# Patient Record
Sex: Male | Born: 1945
Health system: Southern US, Community
[De-identification: ages and names within clinical notes are randomized; demographics above are authoritative.]

## PROBLEM LIST (undated history)

## (undated) DIAGNOSIS — G43909 Migraine, unspecified, not intractable, without status migrainosus: Secondary | ICD-10-CM

## (undated) DIAGNOSIS — Z720 Tobacco use: Secondary | ICD-10-CM

## (undated) DIAGNOSIS — G4730 Sleep apnea, unspecified: Secondary | ICD-10-CM

## (undated) DIAGNOSIS — E785 Hyperlipidemia, unspecified: Secondary | ICD-10-CM

## (undated) DIAGNOSIS — I10 Essential (primary) hypertension: Secondary | ICD-10-CM

## (undated) DIAGNOSIS — K8031 Calculus of bile duct with cholangitis, unspecified, with obstruction: Secondary | ICD-10-CM

## (undated) DIAGNOSIS — M1990 Unspecified osteoarthritis, unspecified site: Secondary | ICD-10-CM

## (undated) DIAGNOSIS — J45909 Unspecified asthma, uncomplicated: Secondary | ICD-10-CM

## (undated) DIAGNOSIS — F329 Major depressive disorder, single episode, unspecified: Secondary | ICD-10-CM

## (undated) DIAGNOSIS — J449 Chronic obstructive pulmonary disease, unspecified: Secondary | ICD-10-CM

## (undated) DIAGNOSIS — J189 Pneumonia, unspecified organism: Secondary | ICD-10-CM

## (undated) DIAGNOSIS — D369 Benign neoplasm, unspecified site: Secondary | ICD-10-CM

## (undated) DIAGNOSIS — F1021 Alcohol dependence, in remission: Secondary | ICD-10-CM

## (undated) DIAGNOSIS — F419 Anxiety disorder, unspecified: Secondary | ICD-10-CM

## (undated) DIAGNOSIS — K219 Gastro-esophageal reflux disease without esophagitis: Secondary | ICD-10-CM

## (undated) HISTORY — DX: Essential (primary) hypertension: I10

## (undated) HISTORY — DX: Sleep apnea, unspecified: G47.30

## (undated) HISTORY — DX: Anxiety disorder, unspecified: F41.9

## (undated) HISTORY — DX: Pneumonia, unspecified organism: J18.9

## (undated) HISTORY — DX: Alcohol dependence, in remission: F10.21

## (undated) HISTORY — DX: Unspecified asthma, uncomplicated: J45.909

## (undated) HISTORY — DX: Gastro-esophageal reflux disease without esophagitis: K21.9

## (undated) HISTORY — DX: Calculus of bile duct with cholangitis, unspecified, with obstruction: K80.31

## (undated) HISTORY — DX: Hyperlipidemia, unspecified: E78.5

## (undated) HISTORY — DX: Chronic obstructive pulmonary disease, unspecified: J44.9

## (undated) HISTORY — DX: Unspecified osteoarthritis, unspecified site: M19.90

## (undated) HISTORY — DX: Tobacco use: Z72.0

## (undated) HISTORY — DX: Benign neoplasm, unspecified site: D36.9

## (undated) HISTORY — DX: Major depressive disorder, single episode, unspecified: F32.9

---
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
## 1969-10-08 HISTORY — PX: HEMORRHOID SURGERY: SHX153

## 2000-10-24 LAB — PULMONARY FUNCTION TEST

## 2003-10-09 HISTORY — PX: ANTERIOR FUSION CERVICAL SPINE: SUR626

## 2003-10-13 ENCOUNTER — Emergency Department (HOSPITAL_COMMUNITY): Admission: AD | Admit: 2003-10-13 | Discharge: 2003-10-13 | Payer: Self-pay | Admitting: Family Medicine

## 2004-05-11 ENCOUNTER — Ambulatory Visit (HOSPITAL_COMMUNITY): Admission: RE | Admit: 2004-05-11 | Discharge: 2004-05-11 | Payer: Self-pay | Admitting: Internal Medicine

## 2004-07-12 ENCOUNTER — Ambulatory Visit: Payer: Self-pay | Admitting: *Deleted

## 2004-07-12 ENCOUNTER — Ambulatory Visit: Payer: Self-pay | Admitting: Internal Medicine

## 2004-10-26 ENCOUNTER — Ambulatory Visit: Payer: Self-pay | Admitting: Internal Medicine

## 2004-12-01 ENCOUNTER — Ambulatory Visit: Payer: Self-pay | Admitting: Internal Medicine

## 2004-12-11 ENCOUNTER — Ambulatory Visit: Payer: Self-pay | Admitting: Internal Medicine

## 2005-03-07 ENCOUNTER — Encounter: Admission: RE | Admit: 2005-03-07 | Discharge: 2005-03-07 | Payer: Self-pay | Admitting: Neurology

## 2005-04-02 ENCOUNTER — Ambulatory Visit: Payer: Self-pay | Admitting: Internal Medicine

## 2005-04-24 ENCOUNTER — Ambulatory Visit (HOSPITAL_COMMUNITY): Admission: RE | Admit: 2005-04-24 | Discharge: 2005-04-24 | Payer: Self-pay | Admitting: Internal Medicine

## 2005-04-24 ENCOUNTER — Encounter (INDEPENDENT_AMBULATORY_CARE_PROVIDER_SITE_OTHER): Payer: Self-pay | Admitting: Cardiology

## 2005-06-28 ENCOUNTER — Ambulatory Visit (HOSPITAL_COMMUNITY): Admission: RE | Admit: 2005-06-28 | Discharge: 2005-06-29 | Payer: Self-pay | Admitting: Neurosurgery

## 2005-09-07 ENCOUNTER — Emergency Department (HOSPITAL_COMMUNITY): Admission: EM | Admit: 2005-09-07 | Discharge: 2005-09-08 | Payer: Self-pay | Admitting: Emergency Medicine

## 2005-09-10 ENCOUNTER — Emergency Department (HOSPITAL_COMMUNITY): Admission: EM | Admit: 2005-09-10 | Discharge: 2005-09-10 | Payer: Self-pay | Admitting: Emergency Medicine

## 2005-10-19 ENCOUNTER — Ambulatory Visit: Payer: Self-pay | Admitting: Internal Medicine

## 2005-10-29 ENCOUNTER — Ambulatory Visit: Payer: Self-pay | Admitting: Internal Medicine

## 2005-11-07 ENCOUNTER — Ambulatory Visit: Payer: Self-pay | Admitting: Internal Medicine

## 2005-12-10 ENCOUNTER — Ambulatory Visit: Payer: Self-pay | Admitting: Internal Medicine

## 2005-12-17 ENCOUNTER — Emergency Department (HOSPITAL_COMMUNITY): Admission: EM | Admit: 2005-12-17 | Discharge: 2005-12-17 | Payer: Self-pay | Admitting: *Deleted

## 2006-04-18 ENCOUNTER — Ambulatory Visit: Payer: Self-pay | Admitting: Internal Medicine

## 2006-11-07 ENCOUNTER — Ambulatory Visit: Payer: Self-pay | Admitting: Internal Medicine

## 2006-11-11 ENCOUNTER — Ambulatory Visit (HOSPITAL_COMMUNITY): Admission: RE | Admit: 2006-11-11 | Discharge: 2006-11-11 | Payer: Self-pay | Admitting: Internal Medicine

## 2007-02-17 ENCOUNTER — Ambulatory Visit: Payer: Self-pay | Admitting: Internal Medicine

## 2007-03-06 ENCOUNTER — Ambulatory Visit: Payer: Self-pay | Admitting: Internal Medicine

## 2007-03-18 ENCOUNTER — Encounter: Payer: Self-pay | Admitting: Internal Medicine

## 2007-03-18 ENCOUNTER — Ambulatory Visit (HOSPITAL_COMMUNITY): Admission: RE | Admit: 2007-03-18 | Discharge: 2007-03-18 | Payer: Self-pay | Admitting: Internal Medicine

## 2007-03-20 ENCOUNTER — Ambulatory Visit: Payer: Self-pay | Admitting: Internal Medicine

## 2007-04-03 ENCOUNTER — Ambulatory Visit: Payer: Self-pay | Admitting: Internal Medicine

## 2007-04-23 ENCOUNTER — Ambulatory Visit: Payer: Self-pay | Admitting: Internal Medicine

## 2007-04-25 ENCOUNTER — Ambulatory Visit (HOSPITAL_COMMUNITY): Admission: RE | Admit: 2007-04-25 | Discharge: 2007-04-25 | Payer: Self-pay | Admitting: Internal Medicine

## 2007-06-22 ENCOUNTER — Emergency Department (HOSPITAL_COMMUNITY): Admission: EM | Admit: 2007-06-22 | Discharge: 2007-06-22 | Payer: Self-pay | Admitting: Emergency Medicine

## 2007-12-01 ENCOUNTER — Ambulatory Visit: Payer: Self-pay | Admitting: Internal Medicine

## 2007-12-25 ENCOUNTER — Ambulatory Visit: Payer: Self-pay | Admitting: Internal Medicine

## 2008-01-29 ENCOUNTER — Ambulatory Visit: Payer: Self-pay | Admitting: Internal Medicine

## 2008-01-29 DIAGNOSIS — D369 Benign neoplasm, unspecified site: Secondary | ICD-10-CM

## 2008-01-29 HISTORY — PX: COLONOSCOPY W/ BIOPSIES: SHX1374

## 2008-01-29 HISTORY — DX: Benign neoplasm, unspecified site: D36.9

## 2008-05-20 ENCOUNTER — Inpatient Hospital Stay (HOSPITAL_COMMUNITY): Admission: EM | Admit: 2008-05-20 | Discharge: 2008-05-24 | Payer: Self-pay | Admitting: Emergency Medicine

## 2008-05-20 ENCOUNTER — Ambulatory Visit: Payer: Self-pay | Admitting: Internal Medicine

## 2008-05-27 ENCOUNTER — Ambulatory Visit: Payer: Self-pay | Admitting: Internal Medicine

## 2008-05-31 ENCOUNTER — Ambulatory Visit: Payer: Self-pay | Admitting: Internal Medicine

## 2008-10-28 ENCOUNTER — Ambulatory Visit: Payer: Self-pay | Admitting: Internal Medicine

## 2008-10-28 LAB — CONVERTED CEMR LAB
AST: 32 units/L (ref 0–37)
Albumin: 4 g/dL (ref 3.5–5.2)
Alkaline Phosphatase: 145 units/L — ABNORMAL HIGH (ref 39–117)
Basophils Relative: 1 % (ref 0–1)
Eosinophils Absolute: 0.3 10*3/uL (ref 0.0–0.7)
Lymphs Abs: 3.9 10*3/uL (ref 0.7–4.0)
MCHC: 30.2 g/dL (ref 30.0–36.0)
MCV: 89.7 fL (ref 78.0–100.0)
Neutro Abs: 4.8 10*3/uL (ref 1.7–7.7)
Neutrophils Relative %: 50 % (ref 43–77)
Platelets: 271 10*3/uL (ref 150–400)
Potassium: 4.5 meq/L (ref 3.5–5.3)
Sodium: 138 meq/L (ref 135–145)
Total Protein: 7.5 g/dL (ref 6.0–8.3)
WBC: 9.7 10*3/uL (ref 4.0–10.5)

## 2008-11-17 ENCOUNTER — Encounter (INDEPENDENT_AMBULATORY_CARE_PROVIDER_SITE_OTHER): Payer: Self-pay | Admitting: *Deleted

## 2008-11-24 ENCOUNTER — Ambulatory Visit: Payer: Self-pay | Admitting: Internal Medicine

## 2008-12-09 ENCOUNTER — Ambulatory Visit: Payer: Self-pay | Admitting: Surgery

## 2008-12-09 ENCOUNTER — Encounter: Payer: Self-pay | Admitting: Internal Medicine

## 2008-12-09 ENCOUNTER — Ambulatory Visit (HOSPITAL_COMMUNITY): Admission: RE | Admit: 2008-12-09 | Discharge: 2008-12-09 | Payer: Self-pay | Admitting: Internal Medicine

## 2009-01-06 ENCOUNTER — Encounter: Payer: Self-pay | Admitting: Internal Medicine

## 2009-01-28 ENCOUNTER — Ambulatory Visit: Payer: Self-pay | Admitting: Internal Medicine

## 2009-01-28 ENCOUNTER — Encounter (INDEPENDENT_AMBULATORY_CARE_PROVIDER_SITE_OTHER): Payer: Self-pay | Admitting: Internal Medicine

## 2009-01-28 LAB — CONVERTED CEMR LAB
Benzodiazepines.: NEGATIVE
LDL Cholesterol: 163 mg/dL — ABNORMAL HIGH (ref 0–99)
Methadone: NEGATIVE
PSA: 0.53 ng/mL (ref 0.10–4.00)
Propoxyphene: NEGATIVE
VLDL: 63 mg/dL — ABNORMAL HIGH (ref 0–40)

## 2009-01-31 ENCOUNTER — Ambulatory Visit (HOSPITAL_COMMUNITY): Admission: RE | Admit: 2009-01-31 | Discharge: 2009-01-31 | Payer: Self-pay | Admitting: Internal Medicine

## 2009-02-22 ENCOUNTER — Ambulatory Visit: Payer: Self-pay | Admitting: Internal Medicine

## 2009-04-07 ENCOUNTER — Ambulatory Visit: Payer: Self-pay | Admitting: Internal Medicine

## 2009-04-07 ENCOUNTER — Encounter (INDEPENDENT_AMBULATORY_CARE_PROVIDER_SITE_OTHER): Payer: Self-pay | Admitting: Adult Health

## 2009-04-07 LAB — CONVERTED CEMR LAB
AST: 43 units/L — ABNORMAL HIGH (ref 0–37)
Albumin: 4.2 g/dL (ref 3.5–5.2)
Alkaline Phosphatase: 161 units/L — ABNORMAL HIGH (ref 39–117)
BUN: 7 mg/dL (ref 6–23)
HDL: 29 mg/dL — ABNORMAL LOW (ref >39)
LDL Cholesterol: 85 mg/dL (ref 0–99)
Potassium: 4.6 meq/L (ref 3.5–5.3)
Sodium: 139 meq/L (ref 135–145)
Total Protein: 7.8 g/dL (ref 6.0–8.3)

## 2009-05-09 ENCOUNTER — Ambulatory Visit: Payer: Self-pay | Admitting: Internal Medicine

## 2009-05-12 ENCOUNTER — Ambulatory Visit (HOSPITAL_COMMUNITY): Admission: RE | Admit: 2009-05-12 | Discharge: 2009-05-12 | Payer: Self-pay | Admitting: Internal Medicine

## 2009-05-18 ENCOUNTER — Telehealth (INDEPENDENT_AMBULATORY_CARE_PROVIDER_SITE_OTHER): Payer: Self-pay | Admitting: *Deleted

## 2009-05-20 ENCOUNTER — Ambulatory Visit: Payer: Self-pay | Admitting: Internal Medicine

## 2009-06-28 IMAGING — CR DG ABDOMEN 2V
2 series · 2 of 2 positions shown · non-contrast
Comparison: 05/20/2008

CLINICAL DATA: Epigastric pain.  GI bleeding.  Nausea vomiting for
2 days.

ABDOMEN - 2 VIEW

[w abdomen upright]
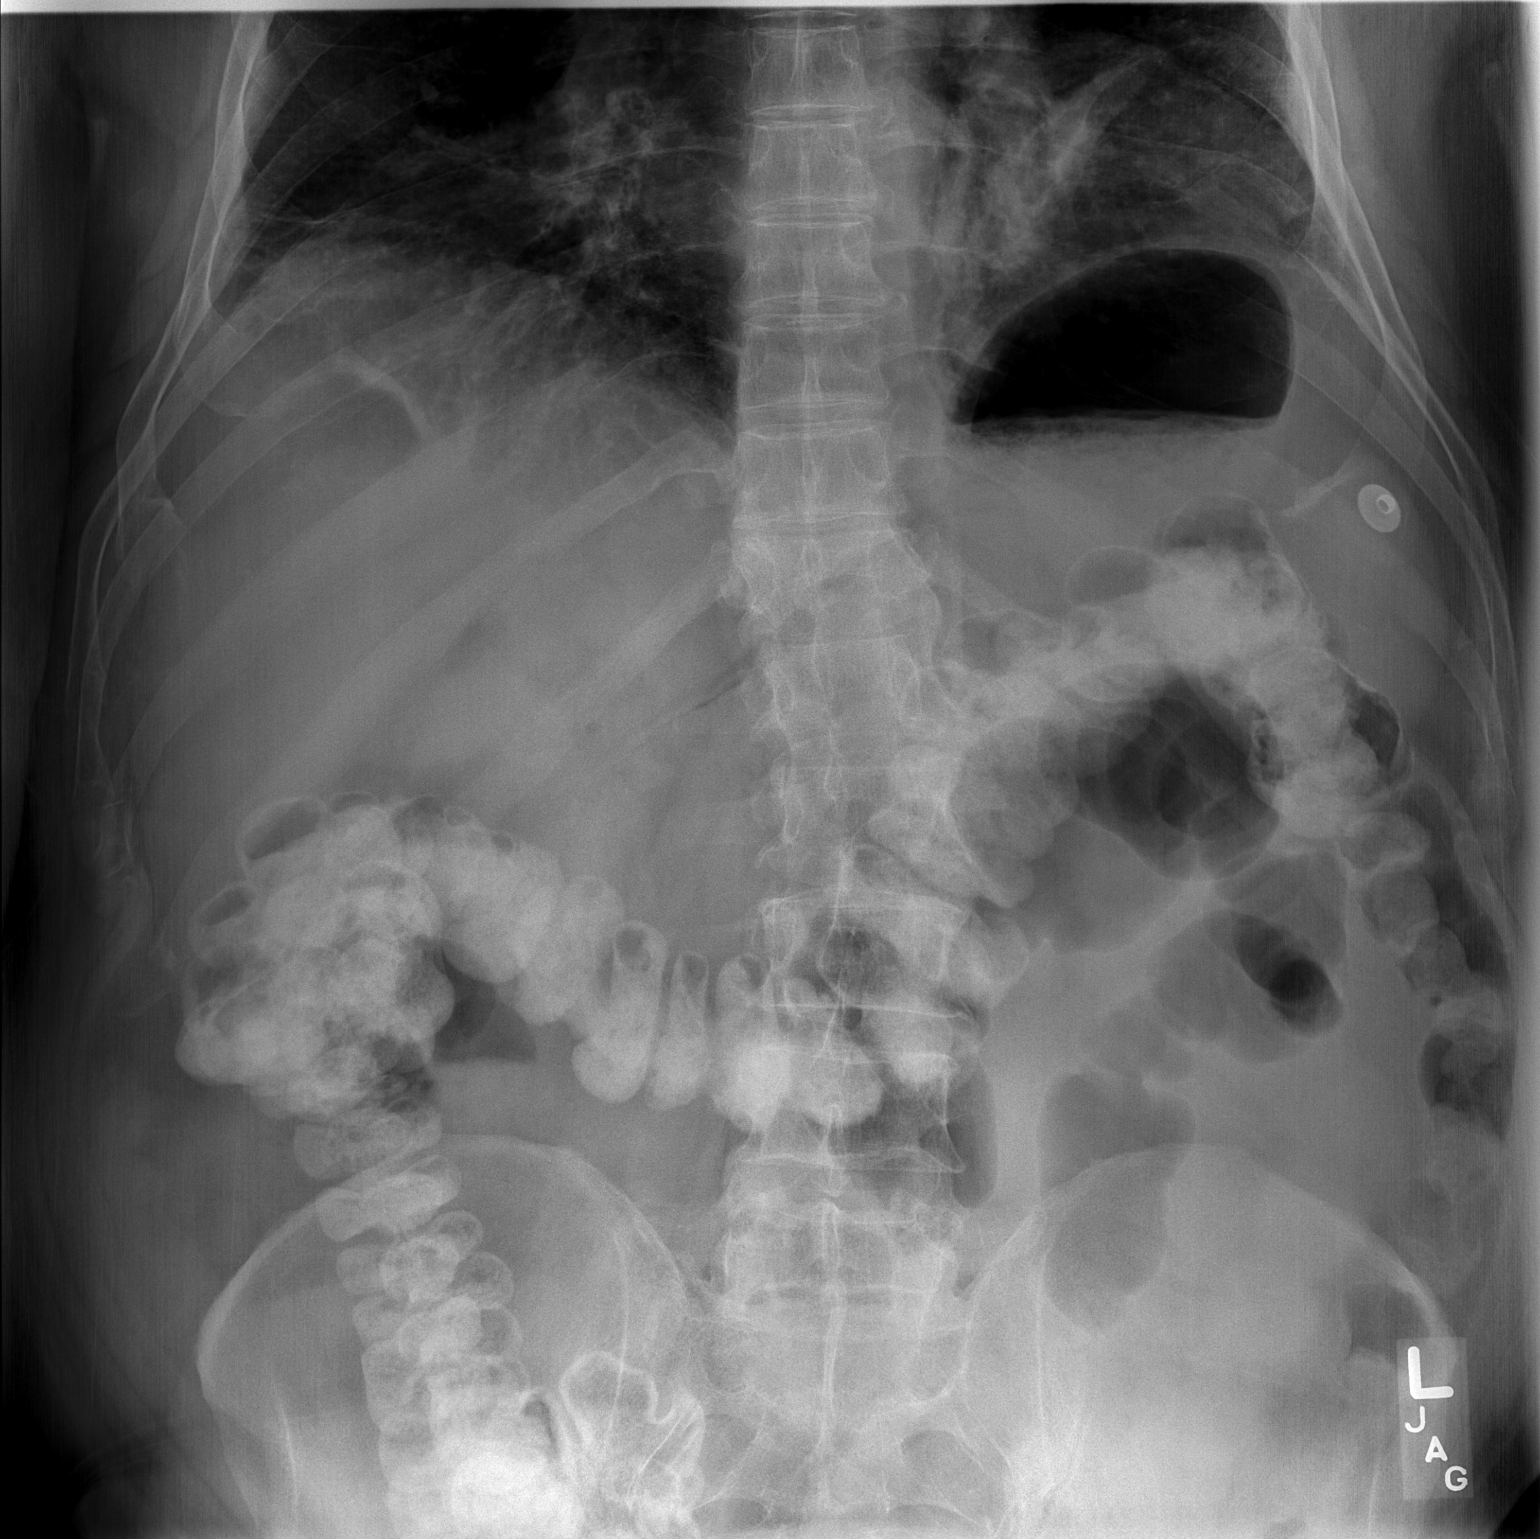

[t abdomen supine]
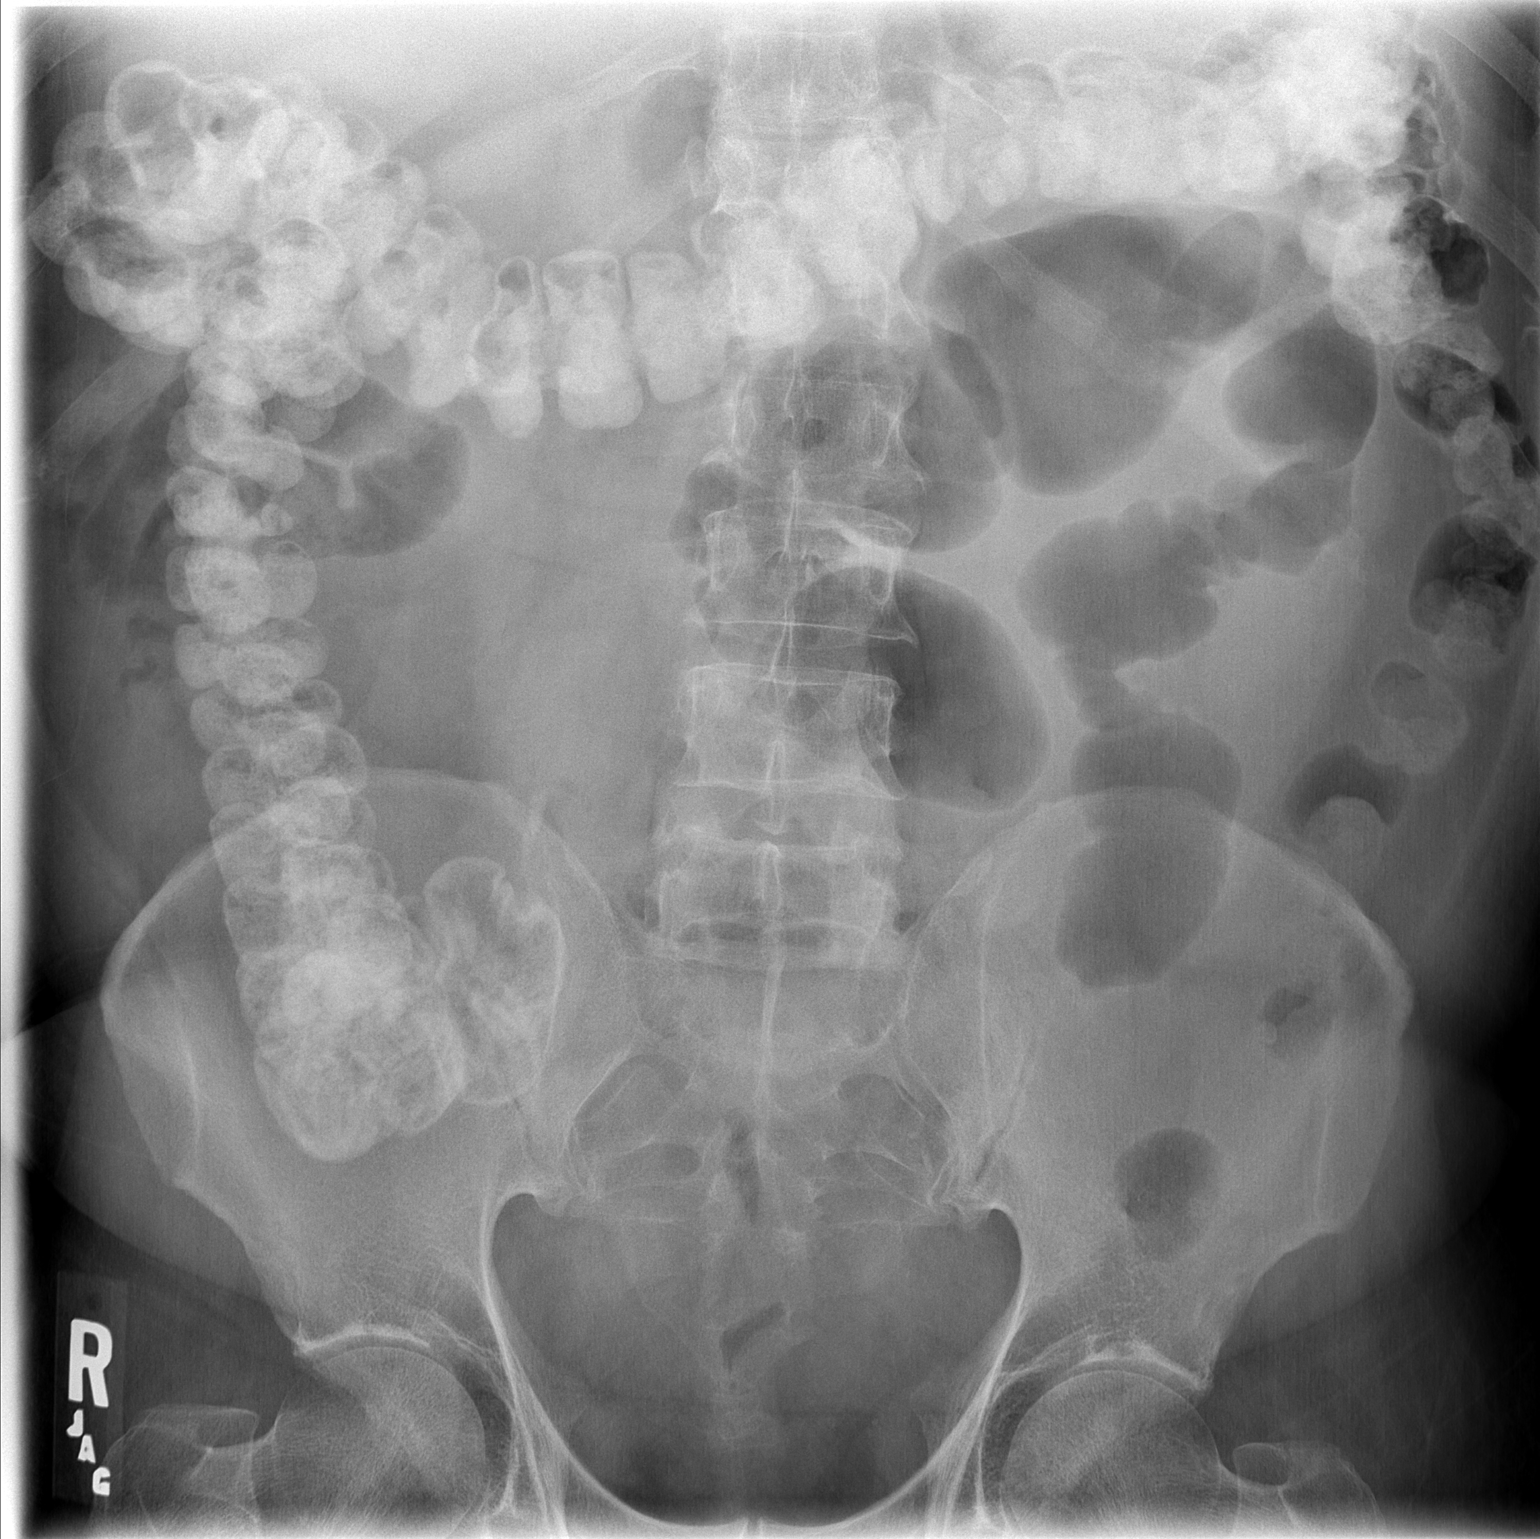

[2 of 2 positions shown; findings below may reference images not displayed]

FINDINGS: Contrast is identified within nondilated loops of colon.
There is gaseous distension of small bowel loops in the mid
abdomen.  This may represent a transient phenomenon as the CT on
05/20/2008 showed no dilated small bowel loops in the same region.
Follow-up films may be helpful to determine if this is a persistent
abnormality.  There is no free intraperitoneal air.
IMPRESSION: Dilatation of small bowel loops in the central abdomen are
consistent with focal ileus, see above.  The pattern is
nonobstructed given the presence of contrast in the colon.

## 2009-06-29 IMAGING — CR DG ABDOMEN 2V
3 series · 3 of 3 positions shown · non-contrast
Comparison: 05/21/2008, CT 05/20/2008.

CLINICAL DATA: 62-year-old male with abdominal pain and history of
gastrointestinal bleeding.

ABDOMEN - 2 VIEW

[w abdomen upright]
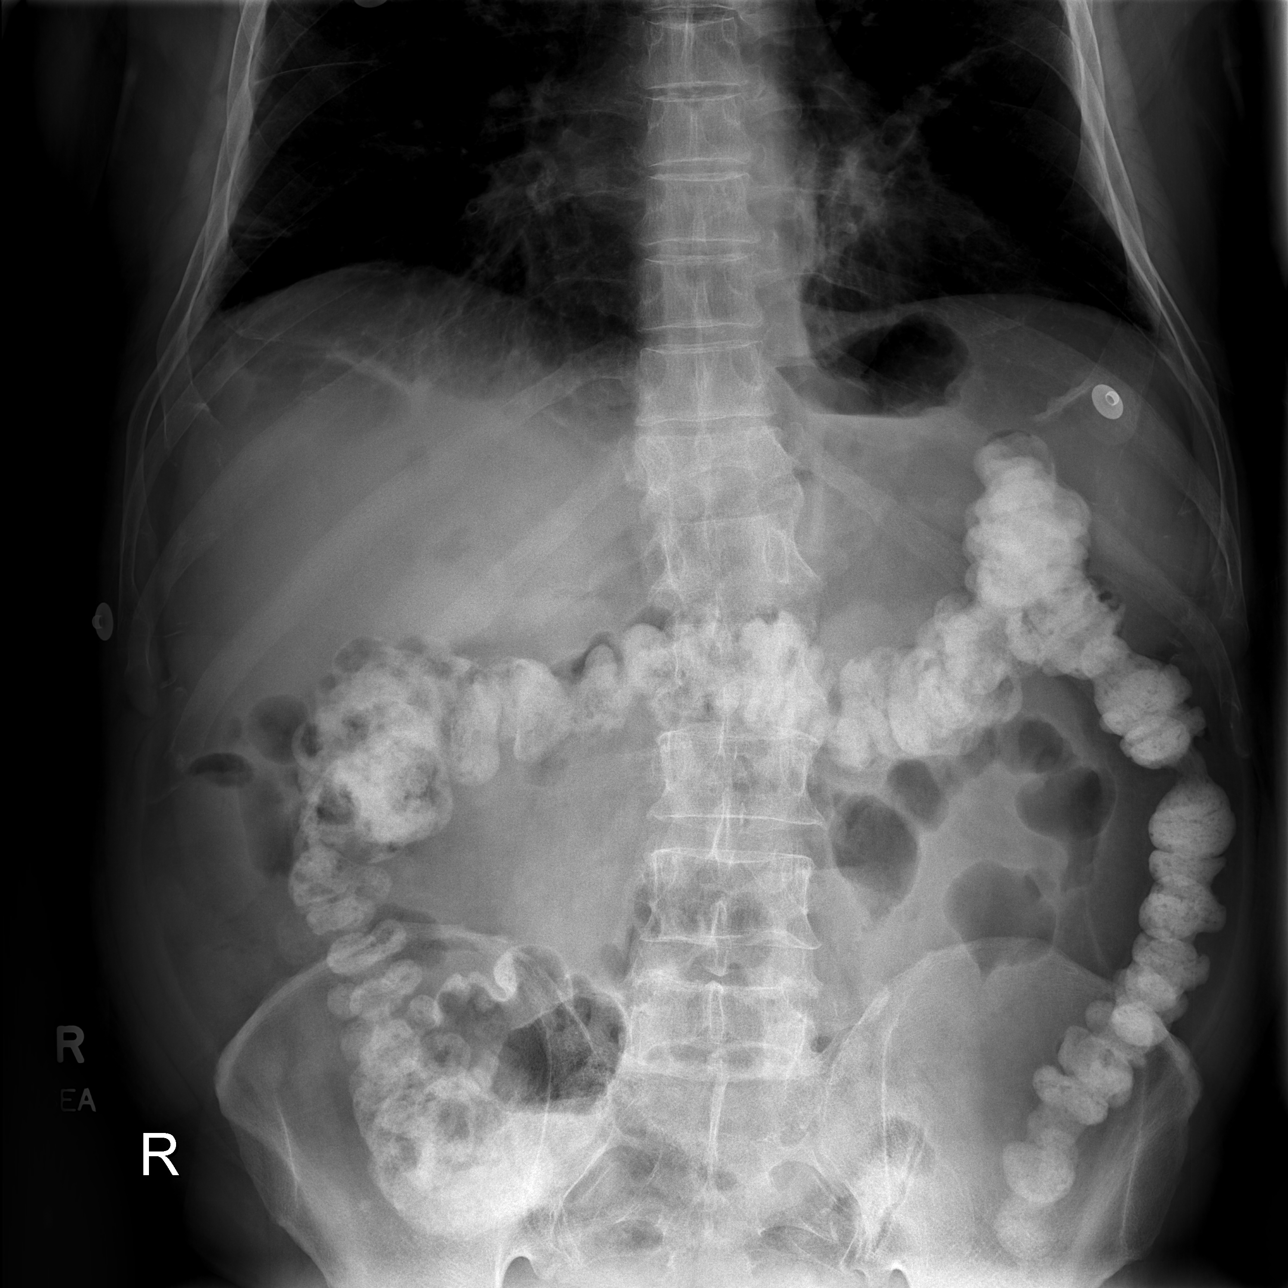

[t abdomen supine (1 of 2)]
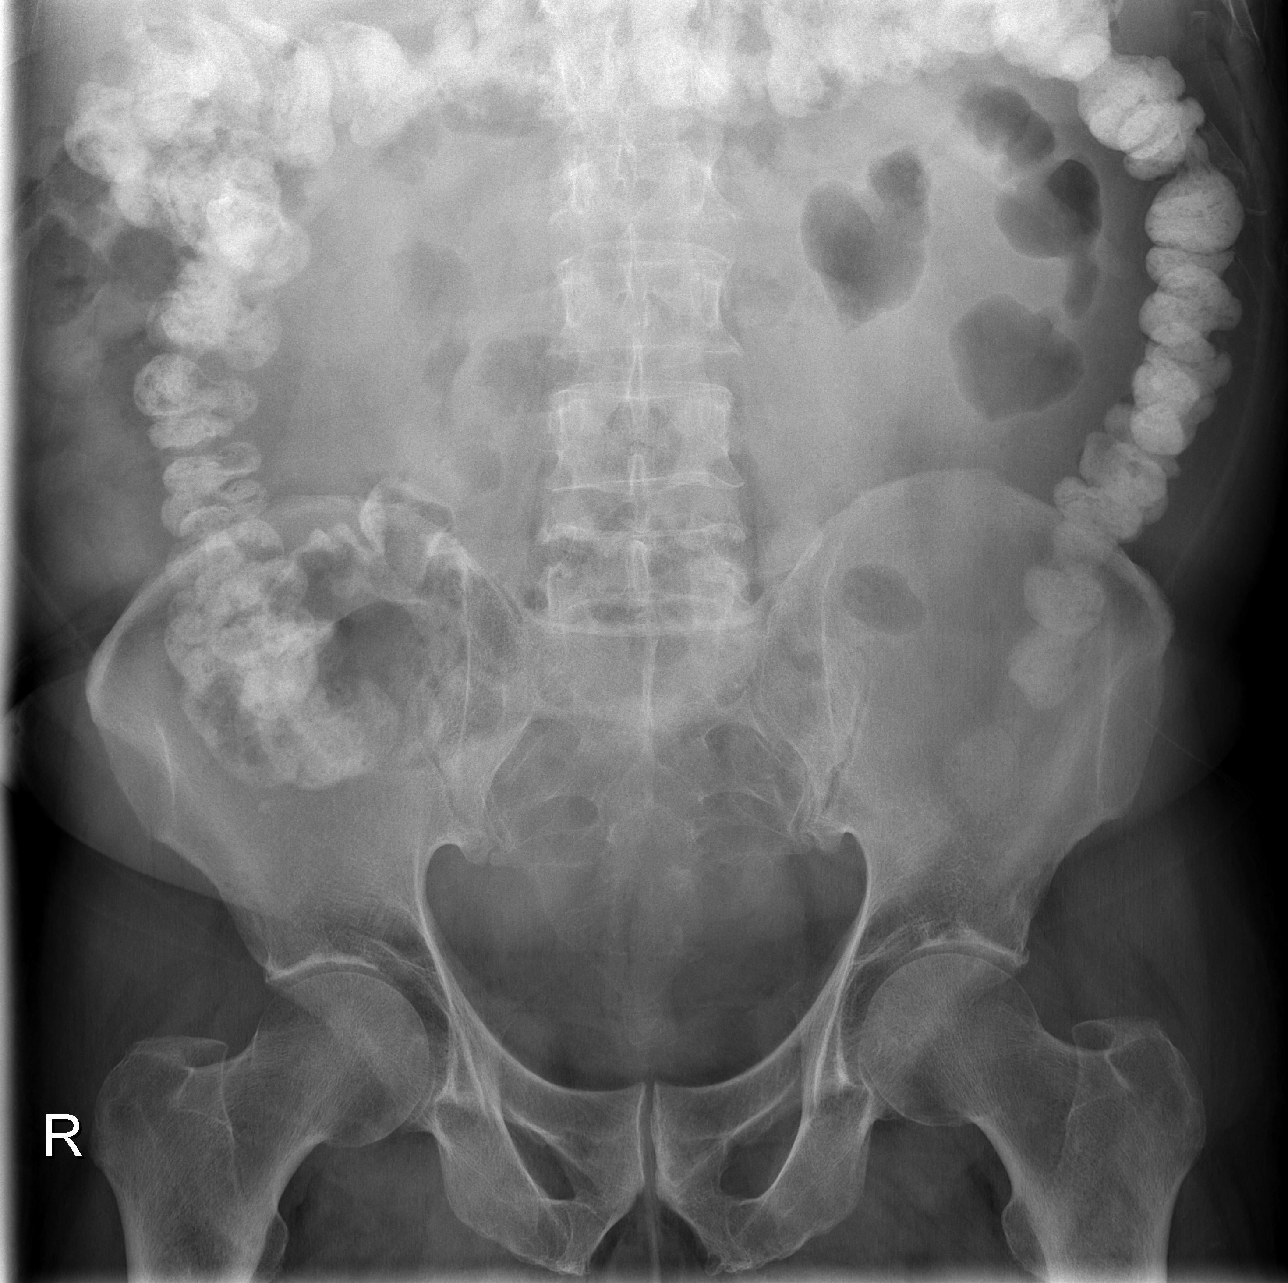

[t abdomen supine (2 of 2)]
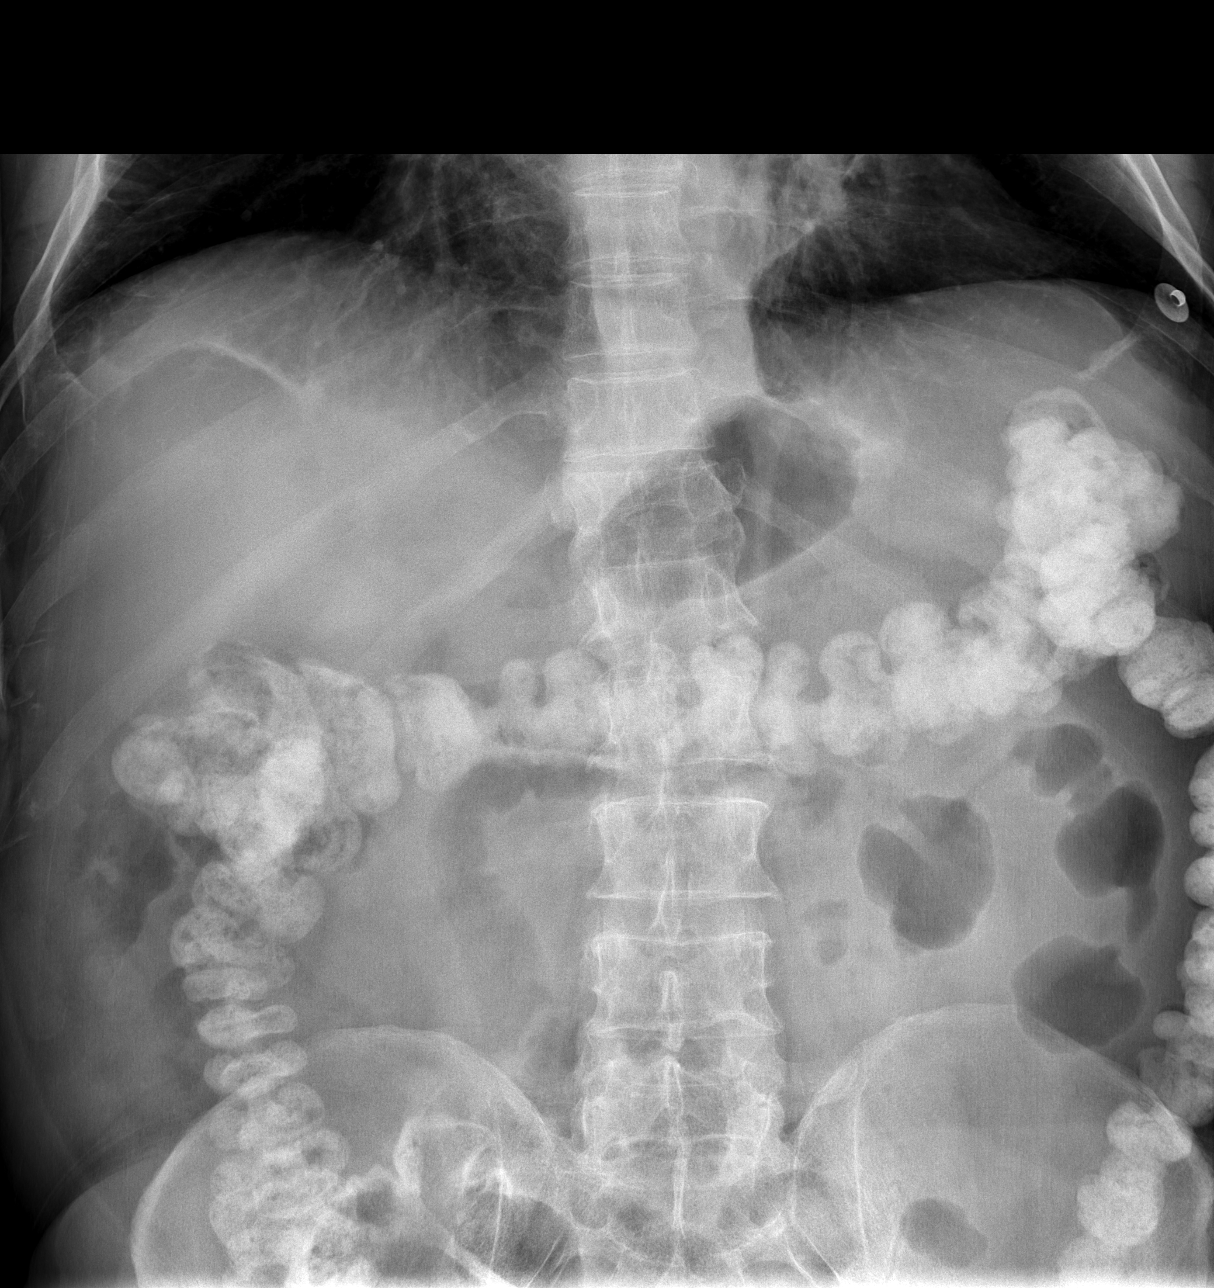

[3 of 3 positions shown; findings below may reference images not displayed]

FINDINGS: Retained CT contrast re-identified in nondilated colon.
Prominent gas filled loops in the left upper quadrant are felt to
reflect redundant sigmoid.  The stomach is decompressed.  Other
scattered nondilated small bowel loops are noted.  Visualized lung
bases are stable with no focal airspace opacity.  Stable visualized
osseous structures.  No pneumoperitoneum identified.
IMPRESSION: Nonobstructed bowel gas pattern. Retained CT contrast in the colon.

## 2009-10-17 LAB — PULMONARY FUNCTION TEST

## 2010-02-21 ENCOUNTER — Ambulatory Visit (HOSPITAL_BASED_OUTPATIENT_CLINIC_OR_DEPARTMENT_OTHER): Admission: RE | Admit: 2010-02-21 | Discharge: 2010-02-21 | Payer: Self-pay | Admitting: Internal Medicine

## 2010-02-25 ENCOUNTER — Ambulatory Visit: Payer: Self-pay | Admitting: Internal Medicine

## 2010-03-09 ENCOUNTER — Emergency Department (HOSPITAL_COMMUNITY): Admission: EM | Admit: 2010-03-09 | Discharge: 2010-03-10 | Payer: Self-pay | Admitting: Emergency Medicine

## 2010-10-29 ENCOUNTER — Encounter: Payer: Self-pay | Admitting: Neurology

## 2010-12-25 LAB — CBC
MCHC: 33.3 g/dL (ref 30.0–36.0)
Platelets: 273 10*3/uL (ref 150–400)
RBC: 4.82 MIL/uL (ref 4.22–5.81)
RDW: 15.1 % (ref 11.5–15.5)

## 2010-12-25 LAB — DIFFERENTIAL
Eosinophils Absolute: 0.4 10*3/uL (ref 0.0–0.7)
Eosinophils Relative: 3 % (ref 0–5)
Lymphs Abs: 4.3 10*3/uL — ABNORMAL HIGH (ref 0.7–4.0)
Monocytes Relative: 4 % (ref 3–12)

## 2010-12-25 LAB — HEMOGLOBIN A1C: Mean Plasma Glucose: 120 mg/dL — ABNORMAL HIGH (ref <117)

## 2010-12-25 LAB — COMPREHENSIVE METABOLIC PANEL
ALT: 17 U/L (ref 0–53)
AST: 20 U/L (ref 0–37)
Albumin: 3.6 g/dL (ref 3.5–5.2)
Calcium: 8.7 mg/dL (ref 8.4–10.5)
GFR calc Af Amer: >60 mL/min (ref >60)
Sodium: 137 mEq/L (ref 135–145)
Total Protein: 8 g/dL (ref 6.0–8.3)

## 2010-12-25 LAB — POCT CARDIAC MARKERS
CKMB, poc: 1.2 ng/mL (ref 1.0–8.0)
Myoglobin, poc: 106 ng/mL (ref 12–200)
Myoglobin, poc: 134 ng/mL (ref 12–200)

## 2011-02-20 NOTE — Discharge Summary (Signed)
[name], [name]             ACCOUNT NO.:  [id number]   MEDICAL RECORD NO.:  [id number]          PATIENT TYPE:  INP   LOCATION:  3741                         FACILITY:  [facility]   PHYSICIAN:  Mick Sell, MD DATE OF BIRTH:  [birth year]   DATE OF ADMISSION:  05/20/2008  DATE OF DISCHARGE:  05/24/2008                               DISCHARGE SUMMARY   DISCHARGE DIAGNOSES:  1. Abdominal pain, probable constipation.  2. Fatty liver.  3. Depression.  4. Chronic obstructive pulmonary disease.  5. Hypertension.  6. Chronic back pain.   DISCHARGE MEDICATIONS:  1. Seroquel 100 mg daily.  2. MiraLax 17 g or 1 heaping tablespoon by mouth daily, may mix with      water or juice.  3. Omeprazole 20 mg twice daily.  4. Neurontin 300 mg 3 times daily.  5. Amitriptyline 15 mg every evening.  6. Lexapro 20 mg daily.  7. Vicodin 5/325 mg 1 tablet every 6 hours as needed for pain.  8. Lasix 20 mg 3 times daily.  9. Combivent 2 puffs every 6 hours as needed.  10.Tizanidine 4 mg twice daily.  11.Maxalt 10 mg as needed for headache.   DISPOSITION AND FOLLOWUP:  The patient was followup with Dr. Yisroel Ramming in the Outpatient Clinic.  The clinic will call to set up an  appointment.  No specific labs need to be followed up at this time.   PROCEDURES PERFORMED:  1. EKG performed, May 20, 2008, showed sinus tachycardia.  No other      abnormalities.  2. Acute abdominal series, May 20, 2008, showed no acute findings      in the chest or abdomen, moderate stool burden was noted.  3. CT abdomen and pelvis with contrast, May 20, 2008, showed no      acute findings in the abdomen, fatty infiltration of the liver and      small cyst in the lower pole on the left kidney.  4. Abdominal ultrasound, May 20, 2008, showed no acute findings,      mild renal cortical thinning and fatty liver.  5. Abdominal x-ray, May 21, 2008, showed diltation of small bowel      loops in the central  abdomen consistent with focal ileus.  The      pattern is nonobstructed given the presence of contrast in the      colon.  6. Abdominal x-ray, May 22, 2008, showed nonobstructed bowel gas      pattern and retained CT contrast in the colon.   CONSULTATIONS:  [facility] was consulted on this patient as they have  performed a colonoscopy on him in April 2009.   BRIEF ADMITTING HISTORY AND PHYSICAL:  The patient is a 65 year old male  with a past medical history of depression who comes in for epigastric  pain for the past 12 hours.  It started suddenly and is constant.  The  pain is decreased when he lays still, and is worse when he move.  This  is the first episode of such pain he has had like this.  It has not  progressed.  The pain is diffuse.  He has had 4 episodes of nausea and  vomiting with no blood.  He has not had any diarrhea or constipation.  Has not had any changes in appetite and feels hungry.  He has had 2  episodes of black stools 3 days ago.  He has not had any new substance  use.  He denies shortness of breath, chest pain, rashes, alcohol use and  weight loss.   PHYSICAL EXAMINATION:  VITAL SIGNS:  On admission, temperature 97, blood  pressure 148/84, pulse 79, respiratory rate 18, and oxygen saturation  92% on room air.  GENERAL:  Lying still in bed.  EYES.  Anicteric.  No pallor.  ENT:  Telangiectasis on nose.  NECK:  Supple.  RESPIRATORY:  Good air movement.  Clear to auscultation bilaterally.  CARDIOVASCULAR:  Regular rate and rhythm.  No murmurs, rubs, or gallops.  Normal S1 and S2.  GI:  Positive bowel sounds.  Nondistended.  Tender to palpation in all 4  quadrants mainly in the epigastric area.  No rebound.  Positive  guarding.  Negative Murphy and McBurney signs.  No palpable mass.  GU:  No CVA tenderness.  NEUROLOGIC:  Alert and oriented x3.  Coherent and fluent.   LABORATORY DATA ON ADMISSION:  Sodium 135, potassium 4.1, chloride 105,  CO2 21, BUN 3,  creatinine 1.1, and glucose 115.  White blood cell count  10.4, hemoglobin 15.3, and platelets 262.  ANC 7.4.  MCV 91.1.  Bilirubin 0.7, alk phos 106, AST 46, and ALT 36.  Protein 6.4, albumin  3.5, calcium 8.9, PT 12.7, INR 0.9, lipase 18, CK-MB 1.3, CK 82, and  troponin 0.02.  UA is negative.  Fecal occult blood test negative.  LDH  153.   HOSPITAL COURSE:  Abdominal pain.  The patient's initial presentation  was concerning for an acute process.  However, a thorough abdominal  workup was negative.  The patient was kept n.p.o. initially and was  hydrated with IV fluids.  His home meds for depression and COPD were  continued.  His Lasix was held.  He was guaiac positive, but this was  expected given his history of internal hemorrhoids.  His hemoglobin  remained stable throughout his hospitalization.  [facility] was  consulted given the ongoing severe nature of his pain as well as his  guarding on physical exam.  Abdominal x-ray on May 21, 2008, showed a  small ileus, which resolved on x-ray on May 22, 2008.  The patient's  abdominal pain slowly improved during his hospitalization and his diet  was advanced and he was transitioned to p.o. pain medication.  By the  day of discharge, the patient was tolerating a regular diet and was no  longer requiring medications for pain.  He was also having regular bowel  movements by this time.  He was discharged on his home medications in  addition to a bowel regimen.   VITALS ON DAY OF DISCHARGE:  Temperature 98.8, blood pressure 135/90,  pulse 73, respiratory rate 18, and oxygen saturation 98% on room air.   DISCHARGE LABORATORY DATA:  White blood cell count 9, hemoglobin 13.3,  hematocrit 39.6, and platelets 258.  Sodium 142, potassium 3.5, chloride  107, CO2 26, BUN 3, creatinine 1, glucose 97, and calcium 8.7.      Rufina Falco, M.D.  Electronically Signed      Mick Sell, MD  Electronically Signed    JY/MEDQ  D:  05/25/2008  T:  05/26/2008  Job:  [id number]   cc:   Tresa Endo L. Philipp Deputy, M.D.

## 2011-02-23 NOTE — Op Note (Signed)
[name], [name]             ACCOUNT NO.:  [id number]   MEDICAL RECORD NO.:  [id number]          PATIENT TYPE:  OIB   LOCATION:  3011                         FACILITY:  [facility]   PHYSICIAN:  Danae Orleans. Venetia Maxon, M.D.  DATE OF BIRTH:  1945/11/21   DATE OF PROCEDURE:  06/28/2005  DATE OF DISCHARGE:                                 OPERATIVE REPORT   PREOPERATIVE DIAGNOSIS:  Cervical spondylosis with myelopathy, herniated  cervical disk with cervical radiculopathy, cervical instability with  retrolisthesis of C4 on C5 with flexion and extension with cervical  stenosis, C4-5 and C5-6 levels.   POSTOPERATIVE DIAGNOSIS:  Cervical spondylosis with myelopathy, herniated  cervical disk with cervical radiculopathy, cervical instability with  retrolisthesis of C4 on C5 with flexion and extension with cervical  stenosis, C4-5 and C5-6 levels.   OPERATION PERFORMED:  Anterior cervical decompression and fusion C4-5 and C5-  6 levels with PEEK interbody spacers with morcellized bone autograft and  anterior cervical plating, C4 through C6 levels.   SURGEON:  Danae Orleans. Venetia Maxon, M.D.   ASSISTANT:  Stefani Dama, M.D.   ANESTHESIA:  General endotracheal.   ESTIMATED BLOOD LOSS:  Minimal.   COMPLICATIONS:  None.   DISPOSITION:  Recovery.   INDICATIONS FOR PROCEDURE:  [name] is a 65 year old with a severe  spastic cervical myelopathy with marked cervical stenosis at the C5-6 level  and with retrolisthesis of C4 on C5 on flexion or on extension radiographs.  He has a significant severe spastic myelopathy.  It was elected to take him  to surgery for anterior cervical decompression and fusion at the C4-5 and C5-  6 levels.   DESCRIPTION OF PROCEDURE:  Mr. Kratochvil was brought to the operating room.  Following satisfactory and uncomplicated induction of general endotracheal  anesthesia and placement of intravenous lines, the patient was placed in  supine position on the operating table.   The neck was placed in neutral  alignment and prior to intubating him, his neck was maintained in neutral  alignment.  I maintained his neck in neutral alignment while anesthesia  intubated the patient with a minimum of neck movement.  His neck was  maintained in neutral alignment and he was placed in 10 pounds of halter  traction.  The anterior neck was then prepped and draped in the usual  sterile fashion.  The area of planned incision was infiltrated with 0.25%  Marcaine and 0.5% lidocaine 1:200,000 epinephrine.  Incision was made from  the midline to the anterior border of the sternocleidomastoid muscle,  carried sharply through the platysma layer.  Subplatysmal dissection was  performed exposing the anterior border of the sternocleidomastoid muscle.  Using blunt dissection, the carotid sheath was kept lateral, the trachea and  esophagus kept medial exposing the anterior cervical spine. A bent spinal  needle was placed at what was felt to be the C5-6 level and this was  confirmed on intraoperative x-ray.  Subsequently, the longus colli muscles  were taken down from the anterior cervical spine from C4 through C6 levels  using electrocautery and Key elevator.  Self-retaining Shadowline retractor  was placed to facilitate exposure along with up-down retractor.  The  interspaces at C5-6 and C4-5 was then incised with a 15 blade and disk  material was removed in piecemeal fashion and to the depths of the disk  spaces exposing the posterior longitudinal ligament.  Subsequently, a disk  space spreader was placed at the C5-6 level and this level was highly  degenerated.  The microscope was brought into the field and provided  microscopic visualization with a high speed drill, the end plates of C5 and  C6 were decorticated and the large osteophytes were removed with significant  decompression of central spinal cord dura.  The ligamentous tissue was also  removed.  The right C6 nerve root was  widely decompressed and the left C6  nerve root was also decompressed.  The osteophytic edges of C6 and C5 were  undercut with Kerrison rongeur to accomplish significant decompression of  the spinal canal.  Hemostasis was assured with Gelfoam soaked in Thrombin.  After trial sizing 8 mm PEEK interbody cage was selected, packed with  morcellized bone autograft which was preserved from drilling of the end  plates, inserted in the interspace and countersunk appropriately.  Attention  was then turned to the C4-5 level where similar decompression was performed.  At this level there appeared to be excessive motion between the vertebrae  and the end plates were decorticated again and central spinal cord dura was  decompressed.  Hemostasis was again assured with Gelfoam soaked in Thrombin  and after trial sizing, an 8 mm PEEK interbody cage was selected, packed  with morcellized bone autograft, inserted in the interspace and countersunk  appropriately.  Traction weight was removed after trial sizing a 37 mm  anterior cervical plate was selected, applied to the anterior cervical spine  which had been cleared of ventral osteophytes and was then affixed to the  anterior cervical spine using 14 mm variable angle screws, two at C4, two at  C5 and two at C6 level.  All screws had excellent purchase.  After a final x-  ray demonstrated well positioned interbody graft and anterior cervical  plate, the locking mechanism was engaged at each level.  The wound was then  copiously irrigated with bacitracin saline.  Soft tissues were inspected and  found to be in good repair.  There was some bleeding along the edge of the  longus colli on the left which was cauterized with bipolar electrocautery  and it was then felt that there was adequate hemostasis in the surgical bed.  The soft tissues were then inspected and found to be in good repair.  The  platysma layer was then reapproximated with 3-0 Vicryl sutures and  skin edges were reapproximated with interrupted 3-0 Vicryl subcuticular stitch.  The wound was dressed with Dermabond.  The patient was extubated in the  operating room and taken to the recovery room in stable and satisfactory  condition having tolerated the operation well.  Counts were correct at the  end of the case.      Danae Orleans. Venetia Maxon, M.D.  Electronically Signed     JDS/MEDQ  D:  06/28/2005  T:  06/29/2005  Job:  [id number]

## 2011-02-23 NOTE — Assessment & Plan Note (Signed)
HEALTHCARE                         GASTROENTEROLOGY OFFICE NOTE   [name], [name]                    MRN:          [id number]  DATE:12/25/2007                            DOB:          1945/11/23    REFERRING PHYSICIAN:  Tresa Endo L. Philipp Deputy, M.D.   CHIEF COMPLAINT:  Rectal bleeding.   ASSESSMENT:  Rectal bleeding/blood in stool and change in bowel habits  in a 65 year old white man.   RECOMMENDATIONS AND PLAN:  We will schedule colonoscopy to help sort  this out.  He is having some increasing constipation problems, it sounds  like, as far as the change in stool goes.  Risks, benefits, and  indications are explained.  He understands and agrees to proceed.   HISTORY:  This is a 65 year old white man with problems as outlined  above.  For the past six months, he is having decreasing frequency and  some mild straining at stool.  There is variable shape to his stool,  which is a new thing.  He used to be more regular, in fact a daily bowel  movement person.  When he has the constipation, he has been having some  bleeding, i.e. when he is straining and has a hard stool.   REVIEW OF SYSTEMS:  As far as GI review of systems, he has some burning  indigestion type symptoms at times, but since he has been on omeprazole,  that has been under control.  Total length of time he has had that  problem has been two to three years.   MEDICATIONS:  1. Maxalt 100 mg p.r.n.  2. Seroquel 100 mg daily.  3. Amitriptyline 50 mg daily.  4. Lexapro 20 mg daily.  5. Gabapentin 300 mg t.i.d.  6. Omeprazole 20 mg daily.  7. Furosemide daily.  8. Hydrocodone 5/500 p.r.n.   PAST MEDICAL HISTORY:  Dyslipidemia, anxiety, depression, chronic  headaches, chronic back pain.   PAST SURGICAL HISTORY:  1. Cervical spine surgery.  2. Spinal fracture after a fall (after his C-spine surgery),      necessitating rod implantation.  3. Hemorrhoid surgery.   FAMILY HISTORY:   There is no colon cancer.  Our review is otherwise  negative.   SOCIAL HISTORY:  He is divorced, he lives alone.  He has not worked  since 2002.  He was in the [employer].  He smokes two-  thirds of a pack per day.  He understands the need to quit and is  attempting to do so.  There is no other tobacco, drug or alcohol use.   REVIEW OF SYSTEMS:  A lot of fatigue.  He has some anorexia.  He has  chronic back pain.  He has peripheral edema, joint pain, insomnia, and  he uses eyeglasses.  All other systems are negative.   PHYSICAL EXAM:  Reveals a well-developed, middle-aged, white man,  looking mildly chronically ill.  Height 5 feet 7.5 inches, weight 207 pounds.  Blood pressure 124/80,  pulse 76.  He moves slowly about the room due to his back pain and joint issues.  EYES:  Anicteric.  Pupils  round and reactive to light.  MOUTH:  Edentulous.  Otherwise free of lesions in the mouth and  posterior pharynx.  NECK:  Supple without thyromegaly or mass.  CHEST:  Clear.  HEART:  S1, S2, no murmurs or gallops.  ABDOMEN:  Obese, soft and nontender.  There is no organomegaly or mass.  RECTAL EXAM:  Deferred.  LYMPHATIC:  No neck or supraclavicular nodes.  EXTREMITIES:  There is 2+ bilateral lower extremity edema.  SKIN:  There is some scaly superficial ulceration in the lower  extremities.  PSYCHIATRIC:  Appropriate affect.  NEUROLOGIC:  Cranial nerves II through XII appear intact.   I have reviewed the office notes and labs sent from [facility].  His TSH  was normal last year.  His CMET was normal last year and a BMET was  normal subsequent to that.  That was in May and July, as far as labs in  2008.     Iva Boop, MD,FACG  Electronically Signed    CEG/MedQ  DD: 12/26/2007  DT: 12/26/2007  Job #: [id number]

## 2011-07-06 LAB — DIFFERENTIAL
Basophils Absolute: 0.1
Basophils Relative: 1
Eosinophils Absolute: 0.3
Eosinophils Relative: 1
Lymphocytes Relative: 24
Lymphs Abs: 2.4
Lymphs Abs: 2.7
Monocytes Absolute: 0.5
Monocytes Relative: 5
Neutro Abs: 5.3
Neutro Abs: 7.4
Neutrophils Relative %: 71

## 2011-07-06 LAB — COMPREHENSIVE METABOLIC PANEL
AST: 46 — ABNORMAL HIGH
BUN: 3 — ABNORMAL LOW
CO2: 21
Calcium: 8.9
Creatinine, Ser: 1.12
GFR calc Af Amer: >60
GFR calc non Af Amer: >60
Total Bilirubin: 0.7

## 2011-07-06 LAB — SEDIMENTATION RATE: Sed Rate: 2

## 2011-07-06 LAB — URINALYSIS, ROUTINE W REFLEX MICROSCOPIC
Bilirubin Urine: NEGATIVE
Glucose, UA: NEGATIVE
Hgb urine dipstick: NEGATIVE
Ketones, ur: NEGATIVE
Protein, ur: NEGATIVE
pH: 6

## 2011-07-06 LAB — PROTIME-INR
INR: 0.9
Prothrombin Time: 12.3

## 2011-07-06 LAB — RAPID URINE DRUG SCREEN, HOSP PERFORMED
Amphetamines: NOT DETECTED
Benzodiazepines: NOT DETECTED
Cocaine: NOT DETECTED

## 2011-07-06 LAB — CK TOTAL AND CKMB (NOT AT ARMC)
Relative Index: INVALID
Relative Index: INVALID
Total CK: 55
Total CK: 82

## 2011-07-06 LAB — CBC
HCT: 45.5
Hemoglobin: 12.9 — ABNORMAL LOW
MCHC: 32.7
MCHC: 33.6
MCV: 91.1
MCV: 92.1
RBC: 4.26
RBC: 4.99
WBC: 8.8

## 2011-07-06 LAB — CULTURE, BLOOD (ROUTINE X 2): Culture: NO GROWTH

## 2011-07-06 LAB — OCCULT BLOOD X 1 CARD TO LAB, STOOL: Fecal Occult Bld: NEGATIVE

## 2011-07-06 LAB — LIPASE, BLOOD: Lipase: 18

## 2011-07-06 LAB — LACTATE DEHYDROGENASE: LDH: 153

## 2011-07-06 LAB — TSH: TSH: 5.982 — ABNORMAL HIGH

## 2011-07-20 LAB — I-STAT 8, (EC8 V) (CONVERTED LAB)
BUN: 8
Bicarbonate: 28.8 — ABNORMAL HIGH
Chloride: 103
Glucose, Bld: 132 — ABNORMAL HIGH
Hemoglobin: 18 — ABNORMAL HIGH
Sodium: 139

## 2012-06-26 ENCOUNTER — Encounter: Payer: Self-pay | Admitting: Internal Medicine

## 2012-11-11 ENCOUNTER — Encounter: Payer: Self-pay | Admitting: Licensed Clinical Social Worker

## 2012-11-11 ENCOUNTER — Encounter: Payer: Self-pay | Admitting: Internal Medicine

## 2012-11-11 ENCOUNTER — Other Ambulatory Visit: Payer: Self-pay | Admitting: Licensed Clinical Social Worker

## 2012-11-11 ENCOUNTER — Ambulatory Visit (INDEPENDENT_AMBULATORY_CARE_PROVIDER_SITE_OTHER): Payer: PRIVATE HEALTH INSURANCE | Admitting: Internal Medicine

## 2012-11-11 VITALS — BP 111/71 | HR 69 | Temp 96.8°F | Ht 67.5 in | Wt 144.5 lb

## 2012-11-11 DIAGNOSIS — I10 Essential (primary) hypertension: Secondary | ICD-10-CM

## 2012-11-11 DIAGNOSIS — W19XXXA Unspecified fall, initial encounter: Secondary | ICD-10-CM

## 2012-11-11 DIAGNOSIS — Z8639 Personal history of other endocrine, nutritional and metabolic disease: Secondary | ICD-10-CM

## 2012-11-11 DIAGNOSIS — F1011 Alcohol abuse, in remission: Secondary | ICD-10-CM

## 2012-11-11 DIAGNOSIS — Z79899 Other long term (current) drug therapy: Secondary | ICD-10-CM

## 2012-11-11 DIAGNOSIS — F17200 Nicotine dependence, unspecified, uncomplicated: Secondary | ICD-10-CM

## 2012-11-11 DIAGNOSIS — Z9181 History of falling: Secondary | ICD-10-CM

## 2012-11-11 DIAGNOSIS — M7989 Other specified soft tissue disorders: Secondary | ICD-10-CM

## 2012-11-11 DIAGNOSIS — J449 Chronic obstructive pulmonary disease, unspecified: Secondary | ICD-10-CM

## 2012-11-11 DIAGNOSIS — Z0000 Encounter for general adult medical examination without abnormal findings: Secondary | ICD-10-CM

## 2012-11-11 DIAGNOSIS — E785 Hyperlipidemia, unspecified: Secondary | ICD-10-CM

## 2012-11-11 DIAGNOSIS — R51 Headache: Secondary | ICD-10-CM

## 2012-11-11 DIAGNOSIS — Z720 Tobacco use: Secondary | ICD-10-CM

## 2012-11-11 DIAGNOSIS — G479 Sleep disorder, unspecified: Secondary | ICD-10-CM

## 2012-11-11 DIAGNOSIS — R600 Localized edema: Secondary | ICD-10-CM

## 2012-11-11 DIAGNOSIS — R2689 Other abnormalities of gait and mobility: Secondary | ICD-10-CM

## 2012-11-11 DIAGNOSIS — M549 Dorsalgia, unspecified: Secondary | ICD-10-CM

## 2012-11-11 LAB — CBC WITH DIFFERENTIAL/PLATELET
Basophils Absolute: 0 10*3/uL (ref 0.0–0.1)
Basophils Relative: 0 % (ref 0–1)
Eosinophils Relative: 2 % (ref 0–5)
HCT: 46.9 % (ref 39.0–52.0)
Hemoglobin: 16.4 g/dL (ref 13.0–17.0)
MCH: 33.2 pg (ref 26.0–34.0)
MCHC: 35 g/dL (ref 30.0–36.0)
MCV: 94.9 fL (ref 78.0–100.0)
Monocytes Absolute: 0.4 10*3/uL (ref 0.1–1.0)
Monocytes Relative: 5 % (ref 3–12)
RDW: 12.5 % (ref 11.5–15.5)

## 2012-11-11 LAB — LIPID PANEL
LDL Cholesterol: 137 mg/dL — ABNORMAL HIGH (ref 0–99)
Triglycerides: 69 mg/dL (ref <150)
VLDL: 14 mg/dL (ref 0–40)

## 2012-11-11 LAB — COMPLETE METABOLIC PANEL WITH GFR
ALT: 11 U/L (ref 0–53)
AST: 21 U/L (ref 0–37)
Albumin: 4.5 g/dL (ref 3.5–5.2)
Alkaline Phosphatase: 99 U/L (ref 39–117)
BUN: 9 mg/dL (ref 6–23)
Calcium: 10.1 mg/dL (ref 8.4–10.5)
Chloride: 102 mEq/L (ref 96–112)
Potassium: 4.1 mEq/L (ref 3.5–5.3)
Sodium: 142 mEq/L (ref 135–145)
Total Protein: 8.1 g/dL (ref 6.0–8.3)

## 2012-11-11 LAB — GLUCOSE, CAPILLARY: Glucose-Capillary: 86 mg/dL (ref 70–99)

## 2012-11-11 MED ORDER — TRAMADOL HCL 50 MG PO TABS: 50.0000 mg | ORAL_TABLET | Freq: Four times a day (QID) | ORAL | Status: DC | PRN

## 2012-11-11 MED ORDER — MELATONIN 3 MG PO TABS: 1.0000 | ORAL_TABLET | Freq: Every evening | ORAL | Status: DC | PRN

## 2012-11-11 NOTE — Patient Instructions (Addendum)
-  Take melatonin to help you sleep.  -Take Ultram to help with your back pain.  -You need to quit smoking. You may call 1-800-QUIT-NOW for more information on how to quit smoking -Please bring all your medications with your during your next visit.  -Call us if you have any questions -Please make an appointment to see [place] by the end of February.  -It was nice meeting you!

## 2012-11-11 NOTE — Progress Notes (Signed)
[name] was referred to CSW for home health services.  CSW met with [name] during scheduled [facility] visit.  Pt considers himself homebound, only leaves for food/pharmacy items.  [name] states he has Medicare/Medicaid and has no preference of agency.  Pt in agreement with referral to [facility] for [facility] PT services.  Pt receives $15 food stamps and utilizes cane.  [name] is independent with ADL's and denies add'l needs at this time.  CSW confirmed address and phone number.  Referral to [facility].

## 2012-11-12 ENCOUNTER — Encounter: Payer: Self-pay | Admitting: Internal Medicine

## 2012-11-12 DIAGNOSIS — Z720 Tobacco use: Secondary | ICD-10-CM | POA: Insufficient documentation

## 2012-11-12 DIAGNOSIS — I10 Essential (primary) hypertension: Secondary | ICD-10-CM | POA: Insufficient documentation

## 2012-11-12 DIAGNOSIS — F1011 Alcohol abuse, in remission: Secondary | ICD-10-CM | POA: Insufficient documentation

## 2012-11-12 DIAGNOSIS — M7989 Other specified soft tissue disorders: Secondary | ICD-10-CM | POA: Insufficient documentation

## 2012-11-12 DIAGNOSIS — G479 Sleep disorder, unspecified: Secondary | ICD-10-CM | POA: Insufficient documentation

## 2012-11-12 DIAGNOSIS — Z0000 Encounter for general adult medical examination without abnormal findings: Secondary | ICD-10-CM | POA: Insufficient documentation

## 2012-11-12 DIAGNOSIS — Z9181 History of falling: Secondary | ICD-10-CM | POA: Insufficient documentation

## 2012-11-12 DIAGNOSIS — J449 Chronic obstructive pulmonary disease, unspecified: Secondary | ICD-10-CM | POA: Insufficient documentation

## 2012-11-12 DIAGNOSIS — E785 Hyperlipidemia, unspecified: Secondary | ICD-10-CM | POA: Insufficient documentation

## 2012-11-12 LAB — TSH: TSH: 1.73 u[IU]/mL (ref 0.350–4.500)

## 2012-11-12 MED ORDER — MONTELUKAST SODIUM 10 MG PO TABS: 10.0000 mg | ORAL_TABLET | Freq: Every day | ORAL | Status: DC

## 2012-11-12 MED ORDER — TIOTROPIUM BROMIDE MONOHYDRATE 18 MCG IN CAPS: 18.0000 ug | ORAL_CAPSULE | Freq: Every day | RESPIRATORY_TRACT | Status: DC

## 2012-11-12 MED ORDER — PRAVASTATIN SODIUM 20 MG PO TABS: 20.0000 mg | ORAL_TABLET | Freq: Every day | ORAL | Status: DC

## 2012-11-12 MED ORDER — GABAPENTIN 100 MG PO CAPS: 200.0000 mg | ORAL_CAPSULE | Freq: Every day | ORAL | Status: DC

## 2012-11-12 MED ORDER — FUROSEMIDE 20 MG PO TABS: 20.0000 mg | ORAL_TABLET | Freq: Every day | ORAL | Status: DC | PRN

## 2012-11-12 MED ORDER — ALBUTEROL SULFATE HFA 108 (90 BASE) MCG/ACT IN AERS: 2.0000 | INHALATION_SPRAY | Freq: Four times a day (QID) | RESPIRATORY_TRACT | Status: DC

## 2012-11-12 NOTE — Assessment & Plan Note (Addendum)
He is on Pravachol with no known hx of CAD but family hx of MI and Stroke. He is a current smoker with HTN, but no DM2 or know CAD; his goal LDL is <130 -Lipid panel with LDL of 137, continue statin but may increase dose during his next visit--pt is already thin with BMI of 22, dietary changes may not be needed but will be discussed during his next visit -Hgb A1C of 5.0

## 2012-11-12 NOTE — Assessment & Plan Note (Signed)
Scheduled colonoscopy

## 2012-11-12 NOTE — Assessment & Plan Note (Addendum)
Concerning for CHF as it responded to Lasix.  -Continue Lasix PRN -pro-BNP of 82 today.  -Will consider 2D echo--will await records from his PCP

## 2012-11-12 NOTE — Progress Notes (Signed)
  Subjective:    Patient ID: [name], male    DOB: [birth year], 67 y.o.   MRN: [id number]  HPI [name] is a pleasant 67 year old man with PMH of HTN, hyperlipidemia, remote hx of alcoholism with bad fall, s/p cervical spine fusion, chronic back pain, tobacco use, COPD, sleep disorder, and bilateral leg swelling who comes in today to establish care at the [facility]. Dr. Concepcion Elk had been his PCP since 2005 but he is switching providers secondary to transportation issues.  He denies acute problems but has had chronic headache and back pain that have been present daily, since 2005 after his fall and cervical spine fusion. Currently he rates his headache as a 6/10. He also has occasionally numbness of his fingers bilaterally.  His sleep has been affected by his chronic pain and he reports difficulty falling asleep and staying asleep since his fall. He was on Ambien, then Lunesta in the past but he was notified by [facility] that sleep aid medications are no longer covered by his insurance plan. He reports sleeping 3-4 hours per night.  He denies recent falls.     Review of Systems  Constitutional: Negative for fever, chills, activity change, appetite change, fatigue and unexpected weight change.  HENT: Negative for rhinorrhea.   Respiratory: Negative for cough, chest tightness, shortness of breath and wheezing.   Cardiovascular: Negative for chest pain, palpitations and leg swelling.  Gastrointestinal: Negative for nausea, vomiting, abdominal pain, diarrhea, constipation and blood in stool.  Genitourinary: Negative for dysuria, frequency and difficulty urinating.  Musculoskeletal: Positive for back pain and gait problem. Negative for arthralgias.  Neurological: Positive for headaches. Negative for dizziness and light-headedness.  Psychiatric/Behavioral: Negative for behavioral problems and agitation.       Objective:   Physical Exam  Nursing note and vitals  reviewed. Constitutional: He is oriented to person, place, and time. He appears well-developed. No distress.       Thin, well groomed  HENT:  Head: Normocephalic.  Eyes: Conjunctivae normal are normal.       Wearing bifocals  Neck: Normal range of motion. Neck supple. No JVD present. No tracheal deviation present. No thyromegaly present.       Small (~2cm) well healed surgical scar noted at the right  anterior supraclavicular/cervical area.  Cardiovascular: Normal rate and regular rhythm.   Pulmonary/Chest: Effort normal and breath sounds normal. No stridor. No respiratory distress. He has no wheezes. He has no rales. He exhibits no tenderness.  Abdominal: Soft. Bowel sounds are normal. He exhibits no distension. There is no tenderness.  Musculoskeletal: Normal range of motion. He exhibits no edema and no tenderness.  Lymphadenopathy:    He has no cervical adenopathy.  Neurological: He is alert and oriented to person, place, and time. He has normal reflexes. No cranial nerve deficit. He exhibits normal muscle tone. Coordination abnormal.       UE with 5/5 strength b/l. LE with 4/5 strength b/l more prominent weakness with hip flexion. Intact sensation to light touch in UE and LE b/l. Wide gait with cane use.   Skin: Skin is warm and dry. No rash noted. He is not diaphoretic. No erythema.  Psychiatric: He has a normal mood and affect. His behavior is normal.          Assessment & Plan:

## 2012-11-12 NOTE — Assessment & Plan Note (Addendum)
Pt reports drinking heavily for years but quit in 2005 after bad fall with cervical spine fracture. CMP with LFTs elevated in the past.  -CMP today with nl LFTs

## 2012-11-12 NOTE — Assessment & Plan Note (Signed)
Stable. Continue his current regimen. He may need repeat PFTs if not done in past two years. -Awaiting records from his PCP

## 2012-11-12 NOTE — Assessment & Plan Note (Signed)
He would like to try OTC medication.  -Melatonin

## 2012-11-12 NOTE — Assessment & Plan Note (Addendum)
BP well controlled today. He is not anti-hypertensives currently; he last used Lasix 2 weeks ago.  Creatinine appears to be at his baseline of 0.93 TSH normal

## 2012-11-12 NOTE — Assessment & Plan Note (Addendum)
He is cutting back, ready to quit.  -Gave him info about 1-800-QUIT-Now -Ordered Abdominal/aorta US for evaluation of AAA

## 2012-11-12 NOTE — Assessment & Plan Note (Signed)
S/p cervical spine fusion now with unsteady gait, chronic back pain and headache -referral to CSW for [facility] PT -referral to Pain clinic -continue gabapentin -Tramadol for pain control

## 2012-11-13 ENCOUNTER — Ambulatory Visit (HOSPITAL_COMMUNITY)
Admission: RE | Admit: 2012-11-13 | Discharge: 2012-11-13 | Disposition: A | Discharge status: Home / Self Care | Payer: PRIVATE HEALTH INSURANCE | Source: Ambulatory Visit | Attending: Internal Medicine | Admitting: Internal Medicine

## 2012-11-13 DIAGNOSIS — Z0000 Encounter for general adult medical examination without abnormal findings: Secondary | ICD-10-CM

## 2012-11-13 DIAGNOSIS — Z1389 Encounter for screening for other disorder: Secondary | ICD-10-CM | POA: Insufficient documentation

## 2012-11-13 DIAGNOSIS — I700 Atherosclerosis of aorta: Secondary | ICD-10-CM | POA: Insufficient documentation

## 2012-11-20 ENCOUNTER — Ambulatory Visit: Payer: PRIVATE HEALTH INSURANCE | Admitting: Internal Medicine

## 2012-12-02 ENCOUNTER — Encounter: Payer: Self-pay | Admitting: Internal Medicine

## 2012-12-02 ENCOUNTER — Encounter: Payer: Self-pay | Admitting: Physical Medicine & Rehabilitation

## 2012-12-02 ENCOUNTER — Ambulatory Visit (INDEPENDENT_AMBULATORY_CARE_PROVIDER_SITE_OTHER): Payer: PRIVATE HEALTH INSURANCE | Admitting: Internal Medicine

## 2012-12-02 VITALS — BP 98/66 | HR 64 | Temp 97.0°F | Ht 67.5 in | Wt 148.3 lb

## 2012-12-02 DIAGNOSIS — F17200 Nicotine dependence, unspecified, uncomplicated: Secondary | ICD-10-CM

## 2012-12-02 DIAGNOSIS — Z9181 History of falling: Secondary | ICD-10-CM

## 2012-12-02 DIAGNOSIS — J441 Chronic obstructive pulmonary disease with (acute) exacerbation: Secondary | ICD-10-CM

## 2012-12-02 DIAGNOSIS — E785 Hyperlipidemia, unspecified: Secondary | ICD-10-CM

## 2012-12-02 DIAGNOSIS — Z0000 Encounter for general adult medical examination without abnormal findings: Secondary | ICD-10-CM

## 2012-12-02 DIAGNOSIS — G479 Sleep disorder, unspecified: Secondary | ICD-10-CM

## 2012-12-02 DIAGNOSIS — F1011 Alcohol abuse, in remission: Secondary | ICD-10-CM

## 2012-12-02 DIAGNOSIS — Z23 Encounter for immunization: Secondary | ICD-10-CM

## 2012-12-02 DIAGNOSIS — Z8639 Personal history of other endocrine, nutritional and metabolic disease: Secondary | ICD-10-CM

## 2012-12-02 DIAGNOSIS — M7989 Other specified soft tissue disorders: Secondary | ICD-10-CM

## 2012-12-02 DIAGNOSIS — M549 Dorsalgia, unspecified: Secondary | ICD-10-CM

## 2012-12-02 DIAGNOSIS — I10 Essential (primary) hypertension: Secondary | ICD-10-CM

## 2012-12-02 DIAGNOSIS — J449 Chronic obstructive pulmonary disease, unspecified: Secondary | ICD-10-CM

## 2012-12-02 DIAGNOSIS — Z720 Tobacco use: Secondary | ICD-10-CM

## 2012-12-02 MED ORDER — PRAVASTATIN SODIUM 20 MG PO TABS: 20.0000 mg | ORAL_TABLET | Freq: Every day | ORAL | Status: DC

## 2012-12-02 MED ORDER — TIOTROPIUM BROMIDE MONOHYDRATE 18 MCG IN CAPS: 18.0000 ug | ORAL_CAPSULE | Freq: Every day | RESPIRATORY_TRACT | Status: DC

## 2012-12-02 MED ORDER — TRAMADOL HCL 50 MG PO TABS: 50.0000 mg | ORAL_TABLET | Freq: Four times a day (QID) | ORAL | Status: AC | PRN

## 2012-12-02 MED ORDER — GABAPENTIN 100 MG PO CAPS: 200.0000 mg | ORAL_CAPSULE | Freq: Every day | ORAL | Status: DC

## 2012-12-02 NOTE — Assessment & Plan Note (Signed)
No recent fall. He uses his cane every day now. HH PT determined that he does not qualify for their services at this time as he is active and mobile. Referred him to the Pain clinic where he may receive Physical Therapy if appropriate. Encouraged him to continue using his cane everyday, everywhere.

## 2012-12-02 NOTE — Assessment & Plan Note (Signed)
No leg swelling today or recently, He has not needed Lasix this past month.

## 2012-12-02 NOTE — Assessment & Plan Note (Addendum)
He has cut back to half pack per day, he used to smoke 2.5 packs per day. He quit 4 years ago but restarted shortly after quitting. He is ready to quit and will try to quit on his birthday on [birth year].  He was given brochure on smoking cessation.

## 2012-12-02 NOTE — Assessment & Plan Note (Signed)
He reports improved sleep with melatonin 3mg , 2 tablets at night time. He has 5-6 hours of uninterrupted sleep which he reports as a great improvement. He reports not being able to afford Ambien as his insurance will not cover it for long term. He is content continuing melatonin therapy for now.

## 2012-12-02 NOTE — Patient Instructions (Addendum)
General Instructions: -You need to quit smoking -Follow up with your colonoscopy -Follow up with the Pain clinic -Continue eating baked foods instead of fried foods.  -Follow up with [place] in 3 months for your back pain and blood pressure.  -It was great seeing you today!   Treatment Goals: -Quit smoking   Progress Toward Treatment Goals:  Treatment Goal 12/02/2012  Blood pressure at goal  Stop smoking smoking less    Self Care Goals & Plans:  Self Care Goal 12/02/2012  Manage my medications take my medicines as prescribed; refill my medications on time; bring my medications to every visit  Eat healthy foods eat foods that are low in salt; eat smaller portions  Stop smoking call [facility] (1-800-QUIT-NOW)       Care Management & Community Referrals:  Referral 12/02/2012  Referrals made for care management support none needed

## 2012-12-02 NOTE — Assessment & Plan Note (Addendum)
No PFTs on records. PFT lab called, no records there either. He denies increased cough or shortness of breath. He uses Spiriva every day but has not used Singulair in months and has not required albuterol inhaler in months.  -Continue Spiriva daily, albuterol inhaler as needed -Scheduled PFTs

## 2012-12-02 NOTE — Assessment & Plan Note (Addendum)
-  [place] of aorta normal, negative for AAA  -received Pneumovax today -Reports flu vaccine back in October -reports Tdap back in 2012.  -Colonoscopy rescheduled for 12/08/12 -PFTs scheduled

## 2012-12-02 NOTE — Assessment & Plan Note (Signed)
No recent use, he is determined to abstain from alcohol since his fall in 2005.

## 2012-12-02 NOTE — Assessment & Plan Note (Signed)
Not on medication. He is actually normal/low today. He denies dizziness or lightheadedness, his intake per mouth is unchanged, he is not currently sick. He reports having low BP readings before while asymptomatic. He may not be hypertensive.

## 2012-12-02 NOTE — Assessment & Plan Note (Signed)
Discussed decreased intake to fired foods, his goal LDL is <130, his last LDL at 137. Pt agrees to eat baked foods instead of fried foods. No complaints of leg pain or cramps c/w statin SE. Continue statin.
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
## 2012-12-08 ENCOUNTER — Ambulatory Visit: Payer: PRIVATE HEALTH INSURANCE | Admitting: Internal Medicine

## 2012-12-10 ENCOUNTER — Ambulatory Visit (HOSPITAL_COMMUNITY)
Admission: RE | Admit: 2012-12-10 | Discharge: 2012-12-10 | Disposition: A | Discharge status: Home / Self Care | Payer: PRIVATE HEALTH INSURANCE | Source: Ambulatory Visit | Attending: Internal Medicine | Admitting: Internal Medicine

## 2012-12-10 ENCOUNTER — Encounter (HOSPITAL_COMMUNITY): Payer: PRIVATE HEALTH INSURANCE

## 2012-12-10 DIAGNOSIS — J441 Chronic obstructive pulmonary disease with (acute) exacerbation: Secondary | ICD-10-CM | POA: Insufficient documentation

## 2012-12-10 MED ORDER — ALBUTEROL SULFATE (5 MG/ML) 0.5% IN NEBU
2.5000 mg | INHALATION_SOLUTION | Freq: Once | RESPIRATORY_TRACT | Status: AC
  Administered 2012-12-10: 2.5 mg via RESPIRATORY_TRACT

## 2012-12-29 ENCOUNTER — Encounter: Payer: Self-pay | Admitting: Internal Medicine

## 2012-12-29 ENCOUNTER — Ambulatory Visit: Payer: PRIVATE HEALTH INSURANCE | Admitting: Physical Medicine & Rehabilitation

## 2012-12-29 ENCOUNTER — Ambulatory Visit (INDEPENDENT_AMBULATORY_CARE_PROVIDER_SITE_OTHER): Payer: PRIVATE HEALTH INSURANCE | Admitting: Internal Medicine

## 2012-12-29 VITALS — BP 100/66 | HR 78 | Ht 67.0 in | Wt 149.4 lb

## 2012-12-29 DIAGNOSIS — Z1211 Encounter for screening for malignant neoplasm of colon: Secondary | ICD-10-CM

## 2012-12-29 DIAGNOSIS — Z86010 Personal history of colonic polyps: Secondary | ICD-10-CM

## 2012-12-29 MED ORDER — NA SULFATE-K SULFATE-MG SULF 17.5-3.13-1.6 GM/177ML PO SOLN: ORAL | Status: DC

## 2012-12-29 NOTE — Patient Instructions (Addendum)
You have been scheduled for a colonoscopy with propofol. Please follow written instructions given to you at your visit today.  Please pick up your prep kit at the pharmacy within the next 1-3 days. If you use inhalers (even only as needed), please bring them with you on the day of your procedure.  Thank you for choosing me and [facility].  Carl E. Gessner, M.D., FACG  

## 2012-12-29 NOTE — Progress Notes (Signed)
  Subjective:    Patient ID: [name], male    DOB: 02-01-1946, 67 y.o.   MRN: [id number]  HPI The patient has a history of 2 adenomatous colon polyps the largest of which was 10 mm in 2009. He was not really having active GI problems. He is due for a repeat colonoscopy for surveillance and screening.  Medications, allergies, past medical history, past surgical history, family history and social history are reviewed and updated in the EMR.  Review of Systems As per history of present illness    Objective:   Physical Exam General:  NAD Eyes:   anicteric Lungs:  clear Heart:  S1S2 no rubs, murmurs or gallops Abdomen:  soft and nontender, BS+     Assessment & Plan:   1. Personal history of colonic adenomas   2. Special screening for malignant neoplasms, colon    1. Schedule routine repeat colonoscopy. The risks and benefits as well as alternatives of endoscopic procedure(s) have been discussed and reviewed. All questions answered. The patient agrees to proceed.

## 2013-01-02 ENCOUNTER — Ambulatory Visit: Payer: PRIVATE HEALTH INSURANCE | Admitting: Physical Medicine & Rehabilitation

## 2013-01-05 NOTE — Addendum Note (Signed)
Addended by: Neomia Dear on: 01/05/2013 06:19 PM   Modules accepted: Orders

## 2013-01-16 ENCOUNTER — Telehealth: Payer: Self-pay | Admitting: Internal Medicine

## 2013-01-16 NOTE — Telephone Encounter (Signed)
no

## 2013-01-19 ENCOUNTER — Encounter: Payer: PRIVATE HEALTH INSURANCE | Admitting: Internal Medicine

## 2013-01-19 NOTE — Addendum Note (Signed)
Addended by: Neomia Dear on: 01/19/2013 06:27 PM   Modules accepted: Orders

## 2014-01-19 ENCOUNTER — Encounter: Payer: Self-pay | Admitting: Internal Medicine

## 2014-03-15 ENCOUNTER — Encounter: Payer: PRIVATE HEALTH INSURANCE | Admitting: Internal Medicine

## 2014-05-24 ENCOUNTER — Ambulatory Visit (INDEPENDENT_AMBULATORY_CARE_PROVIDER_SITE_OTHER): Payer: PRIVATE HEALTH INSURANCE | Admitting: Internal Medicine

## 2014-05-24 ENCOUNTER — Encounter: Payer: Self-pay | Admitting: Internal Medicine

## 2014-05-24 VITALS — BP 124/79 | HR 86 | Temp 98.7°F | Wt 164.8 lb

## 2014-05-24 DIAGNOSIS — I10 Essential (primary) hypertension: Secondary | ICD-10-CM

## 2014-05-24 DIAGNOSIS — R609 Edema, unspecified: Secondary | ICD-10-CM

## 2014-05-24 DIAGNOSIS — M545 Low back pain, unspecified: Secondary | ICD-10-CM

## 2014-05-24 DIAGNOSIS — G8929 Other chronic pain: Secondary | ICD-10-CM

## 2014-05-24 DIAGNOSIS — R600 Localized edema: Secondary | ICD-10-CM

## 2014-05-24 DIAGNOSIS — Z720 Tobacco use: Secondary | ICD-10-CM

## 2014-05-24 DIAGNOSIS — J449 Chronic obstructive pulmonary disease, unspecified: Secondary | ICD-10-CM

## 2014-05-24 DIAGNOSIS — F17200 Nicotine dependence, unspecified, uncomplicated: Secondary | ICD-10-CM

## 2014-05-24 DIAGNOSIS — M549 Dorsalgia, unspecified: Secondary | ICD-10-CM

## 2014-05-24 DIAGNOSIS — Z1211 Encounter for screening for malignant neoplasm of colon: Secondary | ICD-10-CM

## 2014-05-24 DIAGNOSIS — G43009 Migraine without aura, not intractable, without status migrainosus: Secondary | ICD-10-CM

## 2014-05-24 DIAGNOSIS — G4700 Insomnia, unspecified: Secondary | ICD-10-CM

## 2014-05-24 DIAGNOSIS — E785 Hyperlipidemia, unspecified: Secondary | ICD-10-CM

## 2014-05-24 MED ORDER — TRAZODONE HCL 100 MG PO TABS: 100.0000 mg | ORAL_TABLET | Freq: Every day | ORAL | Status: DC

## 2014-05-24 MED ORDER — FUROSEMIDE 20 MG PO TABS: 20.0000 mg | ORAL_TABLET | Freq: Every day | ORAL | Status: DC | PRN

## 2014-05-24 MED ORDER — VERAPAMIL HCL ER 120 MG PO CP24: 120.0000 mg | ORAL_CAPSULE | Freq: Every day | ORAL | Status: DC

## 2014-05-24 MED ORDER — ALBUTEROL SULFATE HFA 108 (90 BASE) MCG/ACT IN AERS: 2.0000 | INHALATION_SPRAY | Freq: Four times a day (QID) | RESPIRATORY_TRACT | Status: DC

## 2014-05-24 MED ORDER — TOPIRAMATE 100 MG PO TABS: 100.0000 mg | ORAL_TABLET | Freq: Two times a day (BID) | ORAL | Status: DC

## 2014-05-24 MED ORDER — ATORVASTATIN CALCIUM 20 MG PO TABS: 20.0000 mg | ORAL_TABLET | Freq: Every day | ORAL | Status: DC

## 2014-05-24 MED ORDER — GABAPENTIN 100 MG PO CAPS: 200.0000 mg | ORAL_CAPSULE | Freq: Every day | ORAL | Status: DC

## 2014-05-24 MED ORDER — TIOTROPIUM BROMIDE MONOHYDRATE 18 MCG IN CAPS: 18.0000 ug | ORAL_CAPSULE | Freq: Every day | RESPIRATORY_TRACT | Status: DC

## 2014-05-24 NOTE — Patient Instructions (Signed)
General Instructions: -You have been referred for a colonoscopy.  -You have also been referred to a pain clinic to help with your back pain.  -Call 1-800-QUIT-NOW for further help in quitting smoking.  -Return in 3 months for follow up of your blood pressure and COPD.     Treatment Goals:  Goals (1 Years of Data) as of 05/24/14         11/11/12     Result Component    . LDL CALC < 130  137      Progress Toward Treatment Goals:  Treatment Goal 05/24/2014  Blood pressure at goal  Stop smoking smoking less    Self Care Goals & Plans:  Self Care Goal 05/24/2014  Manage my medications take my medicines as prescribed; bring my medications to every visit  Eat healthy foods eat foods that are low in salt; eat baked foods instead of fried foods  Be physically active find an activity I enjoy  Stop smoking go to the [employer] (https://scott-booker.info/)    No flowsheet data found.   Care Management & Community Referrals:  Referral 05/24/2014  Referrals made for care management support none needed    Smoking Cessation, Tips for Success If you are ready to quit smoking, congratulations! You have chosen to help yourself be healthier. Cigarettes bring nicotine, tar, carbon monoxide, and other irritants into your body. Your lungs, heart, and blood vessels will be able to work better without these poisons. There are many different ways to quit smoking. Nicotine gum, nicotine patches, a nicotine inhaler, or nicotine nasal spray can help with physical craving. Hypnosis, support groups, and medicines help break the habit of smoking. WHAT THINGS CAN I DO TO MAKE QUITTING EASIER?  Here are some tips to help you quit for good:  Pick a date when you will quit smoking completely. Tell all of your friends and family about your plan to quit on that date.  Do not try to slowly cut down on the number of cigarettes you are smoking. Pick a quit date and quit smoking completely starting on that  day.  Throw away all cigarettes.   Clean and remove all ashtrays from your home, work, and car.  On a card, write down your reasons for quitting. Carry the card with you and read it when you get the urge to smoke.  Cleanse your body of nicotine. Drink enough water and fluids to keep your urine clear or pale yellow. Do this after quitting to flush the nicotine from your body.  Learn to predict your moods. Do not let a bad situation be your excuse to have a cigarette. Some situations in your life might tempt you into wanting a cigarette.  Never have "just one" cigarette. It leads to wanting another and another. Remind yourself of your decision to quit.  Change habits associated with smoking. If you smoked while driving or when feeling stressed, try other activities to replace smoking. Stand up when drinking your coffee. Brush your teeth after eating. Sit in a different chair when you read the paper. Avoid alcohol while trying to quit, and try to drink fewer caffeinated beverages. Alcohol and caffeine may urge you to smoke.  Avoid foods and drinks that can trigger a desire to smoke, such as sugary or spicy foods and alcohol.  Ask people who smoke not to smoke around you.  Have something planned to do right after eating or having a cup of coffee. For example, plan to take a walk  or exercise.  Try a relaxation exercise to calm you down and decrease your stress. Remember, you may be tense and nervous for the first 2 weeks after you quit, but this will pass.  Find new activities to keep your hands busy. Play with a pen, coin, or rubber band. Doodle or draw things on paper.  Brush your teeth right after eating. This will help cut down on the craving for the taste of tobacco after meals. You can also try mouthwash.   Use oral substitutes in place of cigarettes. Try using lemon drops, carrots, cinnamon sticks, or chewing gum. Keep them handy so they are available when you have the urge to  smoke.  When you have the urge to smoke, try deep breathing.  Designate your home as a nonsmoking area.  If you are a heavy smoker, ask your health care provider about a prescription for nicotine chewing gum. It can ease your withdrawal from nicotine.  Reward yourself. Set aside the cigarette money you save and buy yourself something nice.  Look for support from others. Join a support group or smoking cessation program. Ask someone at home or at work to help you with your plan to quit smoking.  Always ask yourself, "Do I need this cigarette or is this just a reflex?" Tell yourself, "Today, I choose not to smoke," or "I do not want to smoke." You are reminding yourself of your decision to quit.  Do not replace cigarette smoking with electronic cigarettes (commonly called e-cigarettes). The safety of e-cigarettes is unknown, and some may contain harmful chemicals.  If you relapse, do not give up! Plan ahead and think about what you will do the next time you get the urge to smoke. HOW WILL I FEEL WHEN I QUIT SMOKING? You may have symptoms of withdrawal because your body is used to nicotine (the addictive substance in cigarettes). You may crave cigarettes, be irritable, feel very hungry, cough often, get headaches, or have difficulty concentrating. The withdrawal symptoms are only temporary. They are strongest when you first quit but will go away within 10-14 days. When withdrawal symptoms occur, stay in control. Think about your reasons for quitting. Remind yourself that these are signs that your body is healing and getting used to being without cigarettes. Remember that withdrawal symptoms are easier to treat than the major diseases that smoking can cause.  Even after the withdrawal is over, expect periodic urges to smoke. However, these cravings are generally short lived and will go away whether you smoke or not. Do not smoke! WHAT RESOURCES ARE AVAILABLE TO HELP ME QUIT SMOKING? Your health care  provider can direct you to community resources or hospitals for support, which may include:  Group support.  Education.  Hypnosis.  Therapy. Document Released: 06/22/2004 Document Revised: 02/08/2014 Document Reviewed: 03/12/2013 [facility] Patient Information 2015 [place], [place]. This information is not intended to replace advice given to you by your health care provider. Make sure you discuss any questions you have with your health care provider.

## 2014-05-26 DIAGNOSIS — Z8669 Personal history of other diseases of the nervous system and sense organs: Secondary | ICD-10-CM | POA: Insufficient documentation

## 2014-05-26 DIAGNOSIS — M549 Dorsalgia, unspecified: Secondary | ICD-10-CM

## 2014-05-26 DIAGNOSIS — G8929 Other chronic pain: Secondary | ICD-10-CM | POA: Insufficient documentation

## 2014-05-26 NOTE — Assessment & Plan Note (Signed)
He is doing well on trazodone 50mg  qHS

## 2014-05-26 NOTE — Assessment & Plan Note (Signed)
BP Readings from Last 3 Encounters:  05/24/14 124/79  12/29/12 100/66  12/02/12 98/66    Lab Results  Component Value Date   NA 142 11/11/2012   K 4.1 11/11/2012   CREATININE 0.93 11/11/2012    Assessment: Blood pressure control: controlled Progress toward BP goal:  at goal Comments: He is on verapamil 120mg  24 hr tablet, takes Lasix PRN for leg sweeling  Plan: Medications:  continue current medications Educational resources provided: brochure Self management tools provided: home blood pressure logbook Other plans: cont monitoring

## 2014-05-26 NOTE — Assessment & Plan Note (Addendum)
He has chronic back pain and was given Rx for Vicodin in [place]. He has run out of this medication and states that he has minimum pain.  Will not Rx Vicodin Refer to pain clinic for pain management without provoking worsening of his headaches from opioid use

## 2014-05-26 NOTE — Assessment & Plan Note (Signed)
He is due to repeat colonoscopy, missed his appt last year because he had moved to [facility].   -Referred to GI

## 2014-05-26 NOTE — Assessment & Plan Note (Signed)
  Assessment: Progress toward smoking cessation:  smoking less Barriers to progress toward smoking cessation:  withdrawal symptoms Comments: He is cutting back, down to less than 1/2 pack per day  Plan: Instruction/counseling given:  I counseled patient on the dangers of tobacco use, advised patient to stop smoking, and reviewed strategies to maximize success. Educational resources provided:  [facility] Insurance account manager) brochure Self management tools provided:  smoking cessation plan (STAR Quit Plan) Medications to assist with smoking cessation:  None Patient agreed to the following self-care plans for smoking cessation: go to the [employer] (https://scott-booker.info/)  Other plans: Continue counseling

## 2014-05-26 NOTE — Assessment & Plan Note (Addendum)
He was on topiramate 50mg  BID in [place] and wants to continue this medication which was refilled during this visit.

## 2014-05-26 NOTE — Progress Notes (Signed)
   Subjective:    Patient ID: [name], male    DOB: 1946-06-14, 68 y.o.   MRN: [id number]  HPI Mr. Kielty is a 68 year old man with PMH of Hyperlipidemia, COPD, HTN, history of fall with cervical spine injury s/p cervical spine fusion, and chronic back pain who comes in for routine follow up visit. He tells me that he had to move to [place] last year as he had lost his housing here in [place]. While there he was started on several medications and needs refills for them although he notes that he does not want to continue all of them. He plans to stay in [place] permanently.     Review of Systems  Constitutional: Negative for fever, activity change and appetite change.  Respiratory: Negative for cough, shortness of breath and wheezing.   Cardiovascular: Negative for chest pain, palpitations and leg swelling.  Gastrointestinal: Negative for abdominal pain.  Musculoskeletal: Positive for back pain.       He has chronic lower back pain  Neurological: Negative for dizziness, weakness and light-headedness.  Psychiatric/Behavioral: Negative for agitation.       Objective:   Physical Exam  Nursing note and vitals reviewed. Constitutional: He is oriented to person, place, and time. He appears well-developed and well-nourished. No distress.  Cardiovascular: Normal rate and regular rhythm.   Pulmonary/Chest: Effort normal and breath sounds normal. No respiratory distress. He has no wheezes. He has no rales.  Abdominal: Soft.  Musculoskeletal: He exhibits no edema and no tenderness.  No paraspinal tenderness on palpation of lumbar spine  Neurological: He is alert and oriented to person, place, and time.  Skin: Skin is warm and dry. He is not diaphoretic.  Psychiatric: He has a normal mood and affect.          Assessment & Plan:

## 2014-05-26 NOTE — Assessment & Plan Note (Signed)
Refilled atorvastatin

## 2014-05-26 NOTE — Assessment & Plan Note (Signed)
He is on Spiriva and albuterol in as needed which were refilled.  He needs repeat PFTs, will schedule during his next visit

## 2014-05-27 NOTE — Progress Notes (Signed)
INTERNAL MEDICINE TEACHING ATTENDING ADDENDUM - Lewayne Pauley, MD: I reviewed and discussed at the time of visit with the resident Dr. Kennerly, the patient's medical history, physical examination, diagnosis and results of pertinent tests and treatment and I agree with the patient's care as documented.  

## 2014-08-23 ENCOUNTER — Ambulatory Visit (INDEPENDENT_AMBULATORY_CARE_PROVIDER_SITE_OTHER): Payer: PRIVATE HEALTH INSURANCE | Admitting: Internal Medicine

## 2014-08-23 ENCOUNTER — Encounter: Payer: Self-pay | Admitting: Internal Medicine

## 2014-08-23 VITALS — BP 120/72 | HR 79 | Temp 98.1°F | Wt 156.4 lb

## 2014-08-23 DIAGNOSIS — Z86010 Personal history of colonic polyps: Secondary | ICD-10-CM

## 2014-08-23 DIAGNOSIS — Z720 Tobacco use: Secondary | ICD-10-CM

## 2014-08-23 DIAGNOSIS — I10 Essential (primary) hypertension: Secondary | ICD-10-CM

## 2014-08-23 DIAGNOSIS — M549 Dorsalgia, unspecified: Secondary | ICD-10-CM

## 2014-08-23 DIAGNOSIS — G479 Sleep disorder, unspecified: Secondary | ICD-10-CM

## 2014-08-23 DIAGNOSIS — G8929 Other chronic pain: Secondary | ICD-10-CM

## 2014-08-23 DIAGNOSIS — Z748 Other problems related to care provider dependency: Secondary | ICD-10-CM

## 2014-08-23 DIAGNOSIS — J449 Chronic obstructive pulmonary disease, unspecified: Secondary | ICD-10-CM

## 2014-08-23 DIAGNOSIS — G4733 Obstructive sleep apnea (adult) (pediatric): Secondary | ICD-10-CM | POA: Insufficient documentation

## 2014-08-23 DIAGNOSIS — Z23 Encounter for immunization: Secondary | ICD-10-CM

## 2014-08-23 MED ORDER — MELATONIN 3 MG PO TABS: 1.0000 | ORAL_TABLET | Freq: Every evening | ORAL | Status: DC | PRN

## 2014-08-23 MED ORDER — ALBUTEROL SULFATE HFA 108 (90 BASE) MCG/ACT IN AERS: 2.0000 | INHALATION_SPRAY | Freq: Four times a day (QID) | RESPIRATORY_TRACT | Status: DC

## 2014-08-23 NOTE — Assessment & Plan Note (Signed)
  Assessment: Progress toward smoking cessation:  smoking less Barriers to progress toward smoking cessation:   improving Comments: Smoking 3 cigarettes per day, trying e cigarettes  Plan: Instruction/counseling given:  I counseled patient on the dangers of tobacco use, advised patient to stop smoking, and reviewed strategies to maximize success. Educational resources provided:  [facility] Insurance account manager) brochure Self management tools provided:  smoking cessation plan (STAR Quit Plan) Medications to assist with smoking cessation:  None Patient agreed to the following self-care plans for smoking cessation: call [facility] (1-800-QUIT-NOW)  Other plans: He will continue using e cigarettes, will try the kind without nicotine

## 2014-08-23 NOTE — Assessment & Plan Note (Addendum)
He reports that he had a sleep study years ago and was told that he had to use a CPAP machine and his sleep improved with it.  -Order split Sleep sutdy

## 2014-08-23 NOTE — Assessment & Plan Note (Signed)
Received the flu vaccine today 

## 2014-08-23 NOTE — Progress Notes (Signed)
   Subjective:    Patient ID: [name], male    DOB: May 08, 1946, 68 y.o.   MRN: [id number]  HPI [name] is a 68 year old man with PMH of HTN, COPD, chronic back pain, headaches, sleep disorder, and tobacco abuse who comes in for follow up of his chronic medical problems. He reports having problems with transportation. He uses the public transportation system but is afraid that he will not be able to be on time for his appointments at the pain clinic. His back pain has been "manageable" while off Vicodin but was worse this summer while he has active mowing the grass, etc.  He complains of worsening of his insomnia. He tells me he had been evaluated for sleep apnea years ago and was on CPAP but this was lost/misplace during a move in 2007 or so.  He is in the process of finding a friend through church that may help him with his colonoscopy.   Review of Systems  Constitutional: Negative for fever, chills, diaphoresis, activity change, appetite change, fatigue and unexpected weight change.  Respiratory: Negative for cough, shortness of breath and wheezing.   Cardiovascular: Positive for leg swelling. Negative for chest pain and palpitations.  Gastrointestinal: Negative for nausea, abdominal pain, diarrhea and blood in stool.  Genitourinary: Negative for dysuria and difficulty urinating.  Musculoskeletal: Positive for back pain. Negative for gait problem.  Neurological: Negative for dizziness and light-headedness.  Psychiatric/Behavioral: Positive for sleep disturbance. Negative for agitation.       Objective:   Physical Exam  Constitutional: He is oriented to person, place, and time. He appears well-developed and well-nourished. No distress.  Eyes: Conjunctivae are normal. No scleral icterus.  Wearing glasses   Cardiovascular: Normal rate and regular rhythm.   Pulmonary/Chest: Effort normal and breath sounds normal. No respiratory distress. He has no wheezes. He has no rales.    Abdominal: Soft. Bowel sounds are normal. He exhibits no distension. There is no tenderness. There is no rebound and no guarding.  Musculoskeletal: He exhibits no edema or tenderness.  Neurological: He is alert and oriented to person, place, and time. Coordination normal.  Skin: Skin is warm and dry. He is not diaphoretic.  Psychiatric: He has a normal mood and affect.  Nursing note and vitals reviewed.         Assessment & Plan:

## 2014-08-23 NOTE — Progress Notes (Signed)
Patient ID: [name], male   DOB: 05/10/1946, 68 y.o.   MRN: [id number]  Colonoscopy due - was discussed, patient considering options.  Case discussed with Dr. Hayes Ludwig soon after the resident saw the patient.  We reviewed the resident's history and exam and pertinent patient test results.  I agree with the assessment, diagnosis, and plan of care documented in the resident's note.

## 2014-08-23 NOTE — Assessment & Plan Note (Signed)
He will continue taking trazodone 100mg  qHS. He has been taking Equate sleep aide at times but not with much help. He agrees to try melatonin again. Ultimately, his sleep apnea may be affecting his sleep and he agrees to have a split sleep study.

## 2014-08-23 NOTE — Assessment & Plan Note (Signed)
He is past due for a repeat colonoscopy and agrees to take a church friend with him to this important study. No weight loss, melena/hematochezia reported.

## 2014-08-23 NOTE — Assessment & Plan Note (Signed)
BP Readings from Last 3 Encounters:  08/23/14 120/72  05/24/14 124/79  12/29/12 100/66    Lab Results  Component Value Date   NA 142 11/11/2012   K 4.1 11/11/2012   CREATININE 0.93 11/11/2012    Assessment: Blood pressure control: controlled Progress toward BP goal:  at goal Comments: He is on verapamil 120mg  24 hr daily   Plan: Medications:  continue current medications Educational resources provided: brochure Self management tools provided:   Other plans: Follow up in 3 months

## 2014-08-23 NOTE — Patient Instructions (Signed)
General Instructions: -Ms. Golden Hurter, our Education officer, museum, will call you with transportation information.  -You have been referred to the pain clinic. Once you have transportation set up it is highly recommended that you follow up with them.  -You have been referred to the a night study for CPAP evaluation.  -Please try to go to your colonoscopy appointment. -Follow up in 3 months.   Please bring your medicines with you each time you come to clinic.  Medicines may include prescription medications, over-the-counter medications, herbal remedies, eye drops, vitamins, or other pills.   Progress Toward Treatment Goals:  Treatment Goal 08/23/2014  Blood pressure at goal  Stop smoking smoking less    Self Care Goals & Plans:  Self Care Goal 08/23/2014  Manage my medications take my medicines as prescribed; bring my medications to every visit; refill my medications on time  Eat healthy foods eat foods that are low in salt; eat baked foods instead of fried foods  Be physically active take a walk every day; find an activity I enjoy  Stop smoking call [facility] (1-800-QUIT-NOW)  Meeting treatment goals maintain the current self-care plan    No flowsheet data found.   Care Management & Community Referrals:  Referral 08/23/2014  Referrals made for care management support social worker

## 2014-08-23 NOTE — Assessment & Plan Note (Signed)
He already has referral to the pain clinic. -Referred him to the CSW for transportation assistance -Appreciate CMA Kaye's assistance on previous referral to the pain clinic.

## 2014-08-23 NOTE — Assessment & Plan Note (Signed)
Refilled his albuterol inhaler

## 2014-08-31 ENCOUNTER — Telehealth: Payer: Self-pay | Admitting: Licensed Clinical Social Worker

## 2014-08-31 NOTE — Telephone Encounter (Signed)
Mr. Tamburrino was referred to CSW for transportation assistance.  CSW placed called to pt.  CSW left message requesting return call. CSW provided contact hours and phone number.  Pt's EMR states he has Medicaid.  CSW will discuss TAMS and SCAT as transportation options.

## 2014-09-06 NOTE — Telephone Encounter (Signed)
Pt's phone number is no longer a valid working number.  CSW will mail information regarding TAMS or SCAT for transportation options.

## 2014-09-07 ENCOUNTER — Encounter: Payer: Self-pay | Admitting: *Deleted

## 2014-10-05 ENCOUNTER — Encounter: Payer: Self-pay | Admitting: Internal Medicine

## 2014-10-05 ENCOUNTER — Ambulatory Visit (INDEPENDENT_AMBULATORY_CARE_PROVIDER_SITE_OTHER): Payer: PRIVATE HEALTH INSURANCE | Admitting: Internal Medicine

## 2014-10-05 VITALS — BP 116/83 | HR 118 | Temp 97.9°F | Ht 67.5 in | Wt 151.4 lb

## 2014-10-05 DIAGNOSIS — G8929 Other chronic pain: Secondary | ICD-10-CM

## 2014-10-05 DIAGNOSIS — E785 Hyperlipidemia, unspecified: Secondary | ICD-10-CM

## 2014-10-05 DIAGNOSIS — M549 Dorsalgia, unspecified: Secondary | ICD-10-CM

## 2014-10-05 DIAGNOSIS — I10 Essential (primary) hypertension: Secondary | ICD-10-CM

## 2014-10-05 DIAGNOSIS — G43009 Migraine without aura, not intractable, without status migrainosus: Secondary | ICD-10-CM

## 2014-10-05 DIAGNOSIS — G4700 Insomnia, unspecified: Secondary | ICD-10-CM

## 2014-10-05 DIAGNOSIS — J449 Chronic obstructive pulmonary disease, unspecified: Secondary | ICD-10-CM

## 2014-10-05 DIAGNOSIS — B019 Varicella without complication: Secondary | ICD-10-CM

## 2014-10-05 DIAGNOSIS — B029 Zoster without complications: Secondary | ICD-10-CM

## 2014-10-05 MED ORDER — GABAPENTIN 100 MG PO CAPS: 200.0000 mg | ORAL_CAPSULE | Freq: Two times a day (BID) | ORAL | Status: DC

## 2014-10-05 MED ORDER — TIOTROPIUM BROMIDE MONOHYDRATE 18 MCG IN CAPS: 18.0000 ug | ORAL_CAPSULE | Freq: Every day | RESPIRATORY_TRACT | Status: DC

## 2014-10-05 MED ORDER — TRAZODONE HCL 100 MG PO TABS: 100.0000 mg | ORAL_TABLET | Freq: Every day | ORAL | Status: DC

## 2014-10-05 MED ORDER — TRAMADOL HCL 50 MG PO TABS: 50.0000 mg | ORAL_TABLET | Freq: Four times a day (QID) | ORAL | Status: DC | PRN

## 2014-10-05 MED ORDER — ATORVASTATIN CALCIUM 20 MG PO TABS: 20.0000 mg | ORAL_TABLET | Freq: Every day | ORAL | Status: DC

## 2014-10-05 MED ORDER — ACYCLOVIR 800 MG PO TABS: 800.0000 mg | ORAL_TABLET | Freq: Every day | ORAL | Status: DC

## 2014-10-05 MED ORDER — VERAPAMIL HCL ER 120 MG PO CP24: 120.0000 mg | ORAL_CAPSULE | Freq: Every day | ORAL | Status: DC

## 2014-10-05 MED ORDER — KETOROLAC TROMETHAMINE 60 MG/2ML IM SOLN
30.0000 mg | Freq: Once | INTRAMUSCULAR | Status: AC
  Administered 2014-10-05: 30 mg via INTRAMUSCULAR

## 2014-10-05 MED ORDER — TOPIRAMATE 100 MG PO TABS: 100.0000 mg | ORAL_TABLET | Freq: Two times a day (BID) | ORAL | Status: DC

## 2014-10-05 NOTE — Patient Instructions (Addendum)
General Instructions: -Start taking acyclovir 800 mg 5 times per day, take it for 7 days.  -Start taking gabapentin 200mg , you may take 2 tablets in the morning and 3 tablets at night if needed.  -Take tramadol 50mg  every 6 hours as needed for the pain.  -Follow up with the pain clinic.  -Follow up with [place] in 3 months.    Have a Happy New Year!  Thank you for bringing your medicines today. This helps [place] keep you safe from mistakes.   Progress Toward Treatment Goals:  Treatment Goal 08/23/2014  Blood pressure at goal  Stop smoking smoking less    Self Care Goals & Plans:  Self Care Goal 10/05/2014  Manage my medications take my medicines as prescribed; bring my medications to every visit; refill my medications on time  Eat healthy foods drink diet soda or water instead of juice or soda; eat more vegetables; eat foods that are low in salt; eat baked foods instead of fried foods; eat fruit for snacks and desserts  Be physically active -  Stop smoking -  Meeting treatment goals -    No flowsheet data found.   Care Management & Community Referrals:  Referral 08/23/2014  Referrals made for care management support social worker       Shingles Shingles is caused by the same virus that causes chickenpox. The first feelings may be pain or tingling. A rash will follow in a couple days. The rash may occur on any area of the body. Long-lasting pain is more likely in an elderly person. It can last months to years. There are medicines that can help prevent pain if you start taking them early. HOME CARE   Take cool baths or place cool cloths on the rash as told by your doctor.  Take medicine only as told by your doctor.  Rest as told by your doctor.  Keep your rash clean with mild soap and cool water or as told by your doctor.  Do not scratch your rash. You may use calamine lotion to relieve itchy skin as told by your doctor.  Keep your rash covered with a loose bandage  (dressing).  Avoid touching:  Babies.  Pregnant women.  Children with inflamed skin (eczema).  People who have gotten organ transplants.  People with chronic illnesses, such as leukemia or AIDS.  Wear loose-fitting clothing.  If the rash is on the face, you may need to see a specialist. Keep all appointments. Shingles must be kept away from the eyes, if possible.  Keep all follow-up visits as told by your doctor. GET HELP RIGHT AWAY IF:   You have any pain on the face or eye.  You lose feeling on one side of your face.  You have ear pain or ringing in your ear.  You cannot taste as well.  Your medicines do not help the pain.  Your redness or puffiness (swelling) spreads.  You feel like you are getting worse.  You have a fever. MAKE SURE YOU:   Understand these instructions.  Will watch your condition.  Will get help right away if you are not doing well or get worse. Document Released: 03/12/2008 Document Revised: 02/08/2014 Document Reviewed: 03/12/2008 [facility] Patient Information 2015 [place], [place]. This information is not intended to replace advice given to you by your health care provider. Make sure you discuss any questions you have with your health care provider.

## 2014-10-07 DIAGNOSIS — E785 Hyperlipidemia, unspecified: Secondary | ICD-10-CM | POA: Insufficient documentation

## 2014-10-07 DIAGNOSIS — G43009 Migraine without aura, not intractable, without status migrainosus: Secondary | ICD-10-CM | POA: Insufficient documentation

## 2014-10-07 DIAGNOSIS — B019 Varicella without complication: Secondary | ICD-10-CM | POA: Insufficient documentation

## 2014-10-07 DIAGNOSIS — G4700 Insomnia, unspecified: Secondary | ICD-10-CM | POA: Insufficient documentation

## 2014-10-07 DIAGNOSIS — B029 Zoster without complications: Principal | ICD-10-CM

## 2014-10-07 NOTE — Assessment & Plan Note (Signed)
He has had pain in his back for >2 weeks but had not noticed the rash. He may still benefit from antiviral tx.   Rx acyclovir 800mg  5 times daily for 7 days Rx gabapentin 200mg  BID to 300mg  BID as tolerated  Rx tramadol 50mg  q6h PRN for pain #45

## 2014-10-07 NOTE — Assessment & Plan Note (Addendum)
He received toradol 30mg  IM injection during this visit with improvement of his low back pain.  He was given prescription for tramadol 50mg  q6h PRN for pain #45 His pain clinic has given him an appointment for 11/08/14--he states that he will have transportation assistance for this appointment

## 2014-10-07 NOTE — Assessment & Plan Note (Signed)
BP Readings from Last 3 Encounters:  10/05/14 116/83  08/23/14 120/72  05/24/14 124/79    Lab Results  Component Value Date   NA 142 11/11/2012   K 4.1 11/11/2012   CREATININE 0.93 11/11/2012    Assessment: Blood pressure control:  controlled Progress toward BP goal:   at goal Comments: He is on Lasix 20mg  PRN for leg swelling, on verapamil 120mg  24h tablet  Plan: Medications:  continue current medications Educational resources provided:   Self management tools provided:   Other plans: Follow up in 3 months

## 2014-10-07 NOTE — Progress Notes (Signed)
Medicine attending: Medical history, presenting problems, physical findings, and medications, reviewed with Dr Kennerly on the day of the patient encounter and I concur with her evaluation and management plan. 

## 2014-10-07 NOTE — Assessment & Plan Note (Signed)
Stable, Refilled his albuterol inh and spiriva

## 2014-10-07 NOTE — Assessment & Plan Note (Signed)
Stable. Refilled his topamax

## 2014-10-07 NOTE — Progress Notes (Signed)
   Subjective:    Patient ID: [name], male    DOB: [birth year], 68 y.o.   MRN: [id number]  HPI [name] is a 68 yr old man with PMH of COPD, HTN, chronic back pain, presenting for evaluation for worsening lower back pain of 2 weeks duration. He does not recall trauma/injury, or increased activity in the past 2-3 weeks. He describes the pain as intense even with his clothes barely touching his back. He has no loss of bowel/bladder control, no LE weakness or numbness.  Of note, he has been referred to a pain clinic but has not been seen there as they were unable to reach him by phone.    Review of Systems  Constitutional: Negative for fever, chills, diaphoresis, activity change, appetite change, fatigue and unexpected weight change.  Respiratory: Negative for cough and shortness of breath.   Cardiovascular: Negative for chest pain.  Genitourinary: Negative for dysuria and flank pain.  Musculoskeletal: Positive for back pain.  Neurological: Negative for dizziness, weakness and light-headedness.  Psychiatric/Behavioral: Negative for agitation.       Objective:   Physical Exam  Constitutional: He is oriented to person, place, and time. He appears well-developed and well-nourished. No distress.  Sitting in wheelchair   Eyes: Conjunctivae are normal. No scleral icterus.  Cardiovascular: Normal rate.   Pulmonary/Chest: Effort normal. No respiratory distress.  Musculoskeletal: He exhibits no edema.  Strength 5/5 in bilateral lower extremity   Neurological: He is alert and oriented to person, place, and time. Coordination abnormal.  Wears a cane to stabilize his gait   Skin: Skin is warm and dry. Rash noted. He is not diaphoretic.  Erythematous vesicles with no discharge or skin break over the right lumbar area, right flank, does not cross the midline.  Erythematous papules at the left flank and left chest with no discharge and no skin break.    Psychiatric: He has a normal  mood and affect.  Nursing note and vitals reviewed.         Assessment & Plan:

## 2014-10-07 NOTE — Assessment & Plan Note (Signed)
Refilled his Lipitor. 

## 2014-10-07 NOTE — Assessment & Plan Note (Signed)
Improved with trazodone 100mg  qHS which was refilled during this visit.

## 2014-10-26 ENCOUNTER — Ambulatory Visit (HOSPITAL_BASED_OUTPATIENT_CLINIC_OR_DEPARTMENT_OTHER): Payer: Medicare Other | Attending: Internal Medicine | Admitting: Radiology

## 2014-10-26 VITALS — Ht 67.0 in | Wt 154.0 lb

## 2014-10-26 DIAGNOSIS — R0683 Snoring: Secondary | ICD-10-CM | POA: Insufficient documentation

## 2014-10-26 DIAGNOSIS — G4733 Obstructive sleep apnea (adult) (pediatric): Secondary | ICD-10-CM | POA: Insufficient documentation

## 2014-10-30 ENCOUNTER — Ambulatory Visit (HOSPITAL_BASED_OUTPATIENT_CLINIC_OR_DEPARTMENT_OTHER): Payer: Medicare Other | Admitting: Internal Medicine

## 2014-10-30 DIAGNOSIS — R0683 Snoring: Secondary | ICD-10-CM | POA: Diagnosis not present

## 2014-10-30 DIAGNOSIS — G4733 Obstructive sleep apnea (adult) (pediatric): Secondary | ICD-10-CM | POA: Diagnosis not present

## 2014-10-30 NOTE — Sleep Study (Signed)
   NAME: [name] DATE OF BIRTH:  12-09-1945 MEDICAL RECORD NUMBER [id number]  LOCATION: [facility]  PHYSICIAN: Lili Harts D  DATE OF STUDY: 10/26/2014  SLEEP STUDY TYPE: Nocturnal Polysomnogram               REFERRING PHYSICIAN: Blain Pais, MD  INDICATION FOR STUDY: Insomnia with sleep apnea  EPWORTH SLEEPINESS SCORE:   2/24 HEIGHT: 5\' 7"  (170.2 cm)  WEIGHT: 154 lb (69.854 kg)    Body mass index is 24.11 kg/(m^2).  NECK SIZE: 14 in.  MEDICATIONS: Charted for review  SLEEP ARCHITECTURE: Total sleep time 306.5 minutes with sleep efficiency 78.3%. Stage I was 14.4%, stage II 71.8%, stage III absent, REM 13.9% of total sleep time. Sleep latency 17.5 minutes, REM latency 56 minutes, awake after sleep onset 66.5 minutes, arousal index 13.5, bedtime medication: None  RESPIRATORY DATA: Apnea hypopnea index (AHI) 6.5 per hour. 33 total events scored including 4 obstructive apneas, 3 mixed apneas, 26 hypopneas. Most events were while supine. REM AHI 1.4 per hour. There were not enough respiratory events to meet protocol requirements for split CPAP titration.  OXYGEN DATA: Moderate intermittent snoring with oxygen desaturation to a nadir of 87% and mean saturation 94.5% on room air  CARDIAC DATA: Sinus rhythm with occasional PACs  MOVEMENT/PARASOMNIA: No significant movement disturbance, no bathroom trips  IMPRESSION/ RECOMMENDATION:   1) Mild obstructive sleep apnea/hypopnea syndrome, AHI 6.5 per hour. Events were distinctly positional, associated with supine sleep. REM AHI 1.4 per hour. Moderate intermittent snoring with oxygen desaturation to a nadir of 87% and mean saturation 94.5% on room air. 2) There were not enough events to qualify for split protocol CPAP titration on this study. Note the strongly positional association of his sleep apnea with supine sleep position, suggesting that careful attention to sleep off flat of back may be  sufficient. 3) A previous polysomnogram on 02/21/2010 recorded AHI 21.5 per hour with body weight 190 pounds. If weights were correctly recorded, this would indicate a 35 pound weight loss since 2011.  Deneise Lever Diplomate, American Board of Sleep Medicine  ELECTRONICALLY SIGNED ON:  10/30/2014, 4:56 PM [place] PH: [phone]   FX: [phone] [place]

## 2014-11-08 ENCOUNTER — Ambulatory Visit: Payer: PRIVATE HEALTH INSURANCE | Admitting: Physical Medicine & Rehabilitation

## 2014-11-26 ENCOUNTER — Other Ambulatory Visit: Payer: Self-pay | Admitting: Physical Medicine & Rehabilitation

## 2014-11-26 ENCOUNTER — Encounter: Payer: Medicare Other | Attending: Physical Medicine & Rehabilitation

## 2014-11-26 ENCOUNTER — Encounter: Payer: Self-pay | Admitting: Physical Medicine & Rehabilitation

## 2014-11-26 ENCOUNTER — Ambulatory Visit (HOSPITAL_BASED_OUTPATIENT_CLINIC_OR_DEPARTMENT_OTHER): Payer: Medicare Other | Admitting: Physical Medicine & Rehabilitation

## 2014-11-26 VITALS — BP 115/65 | HR 76 | Resp 14

## 2014-11-26 DIAGNOSIS — G894 Chronic pain syndrome: Secondary | ICD-10-CM

## 2014-11-26 DIAGNOSIS — Z5181 Encounter for therapeutic drug level monitoring: Secondary | ICD-10-CM | POA: Diagnosis not present

## 2014-11-26 DIAGNOSIS — M545 Low back pain, unspecified: Secondary | ICD-10-CM

## 2014-11-26 DIAGNOSIS — Z79899 Other long term (current) drug therapy: Secondary | ICD-10-CM | POA: Insufficient documentation

## 2014-11-26 DIAGNOSIS — B019 Varicella without complication: Secondary | ICD-10-CM

## 2014-11-26 DIAGNOSIS — B029 Zoster without complications: Secondary | ICD-10-CM

## 2014-11-26 DIAGNOSIS — G8929 Other chronic pain: Secondary | ICD-10-CM

## 2014-11-26 MED ORDER — GABAPENTIN 300 MG PO CAPS: 300.0000 mg | ORAL_CAPSULE | Freq: Three times a day (TID) | ORAL | Status: DC

## 2014-11-26 MED ORDER — GABAPENTIN 300 MG PO CAPS: 200.0000 mg | ORAL_CAPSULE | Freq: Three times a day (TID) | ORAL | Status: DC

## 2014-11-26 MED ORDER — TRAMADOL HCL 50 MG PO TABS: 50.0000 mg | ORAL_TABLET | Freq: Three times a day (TID) | ORAL | Status: DC | PRN

## 2014-11-26 NOTE — Patient Instructions (Signed)
Gabapentin dose will be increased to 300 mg 3 times per day, For tingling discomfort  Tramadol 50 mg 3 times per day for back and neck pain  Back x-rays at [facility]  Spine injections in 3-4 weeks, we will need a driver for this

## 2014-11-26 NOTE — Progress Notes (Signed)
Subjective:    Patient ID: [name], male    DOB: 1946-02-22, 69 y.o.   MRN: [id number] CC:  Spine pain neck and low back HPI 69 year old male who had a fall in 29-Jan-2004 or 2005-01-28. He noted some weakness since 28-Jan-2001 in both lower extremities. He presented to neurosurgical consultant on September 13,2006.He was on Neurontin amitriptyline Seroquel Lexapro and diclofenac at that time. Patient underwent cervical decompression and fusion 06/28/2005 C4-5 and C5-C6 levels. 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 No spinal cord involvement with this. Placed in a brace no surgery for that problem. Previously and seen by neurology for headaches as well. Patient had good healing of his fusion according to neurosurgery notes. Patient states he used to drink alcohol but has not drank in about 10 years Pain Inventory Average Pain 8 Pain Right Now 6 My pain is dull and tingling  In the last 24 hours, has pain interfered with the following? General activity 0 Relation with others 0 Enjoyment of life 3 What TIME of day is your pain at its worst? daytime Sleep (in general) Poor  Pain is worse with: bending and some activites Pain improves with: pacing activities Relief from Meds: 1  Mobility walk without assistance ability to climb steps?  yes do you drive?  no  Function disabled: date disabled 2004-01-29  Neuro/Psych weakness numbness tingling  Prior Studies Any changes since last visit?  no  Physicians involved in your care Any changes since last visit?  no   Family History  Problem Relation Age of Onset  . Heart attack Mother   . Heart attack Father   . Stroke Sister   . Heart attack Brother   . Heart attack Sister    History   Social History  . Marital Status: Divorced    Spouse Name: N/A  . Number of Children: N/A  . Years of Education: N/A   Occupational History  . On disability    .  Service Engineer, drilling     he worked at [employer] in [place]  . 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 Smoking status: Current Every Day Smoker -- 0.40 packs/day for 55 years    Types: Cigarettes  . Smokeless tobacco: Not on file     Comment: Has cut down to half pack per day, TRYING TO QUIT  . Alcohol Use: No     Comment: History of heavy alcohol use until 01/29/2004. Pt had two serious mechanical falls with fracture of cervical spine 2/2 EtOH use.  . Drug Use: No  . Sexual Activity: Not on file   Other Topics Concern  . None   Social History Narrative   His son died in a car accident in 45. His granddaughter died in a house fire in 28-Jan-2010.    He lives by himself in a boarding house. He prepares his own meals, he is able to go to the pharmacy and grocery store. He uses public transportation. He uses a cane at his baseline and is independent of ADLs. Most of his family live in [place], [place], or [place]--brother, nieces and nefews. He has a sister in [place], [place] but she is disabled. He attends [employer] in [place] and his Doristine Bosworth there, [name], calls him almost every other day and checks on him 3-4 times per  month.    Past Surgical History  Procedure Laterality Date  . Anterior fusion cervical spine  2005    two surgeries after two mechanical falls, per Dr. Donald Pore  . Eye surgery  1954    correction for crossed-eyes  . Hemorrhoid surgery  1971  . Colonoscopy w/ biopsies  01/29/2008    Dr. Silvano Rusk   Past Medical History  Diagnosis Date  . HTN (hypertension)   . Hyperlipemia   . GERD (gastroesophageal reflux disease)   . History of migraine headaches   . Tobacco use   . Personal history of alcoholism   . COPD (chronic obstructive pulmonary disease)   . History of fall   . Tubular adenoma 01/29/2008  . Anxiety and depression    BP 115/65 mmHg  Pulse 76  Resp 14  SpO2 98%  Opioid Risk Score: 0 (hx anxiety) Fall Risk  Score: Moderate Fall Risk (6-13 points) (educated and given handout on fall prevention) Review of Systems  Constitutional: Positive for appetite change and unexpected weight change.  Respiratory: Positive for shortness of breath.   Musculoskeletal: Positive for back pain.  Neurological: Positive for weakness and numbness.       Tingling  All other systems reviewed and are negative.      Objective:   Physical Exam  Constitutional: He is oriented to person, place, and time. He appears well-developed.  Thin  HENT:  Head: Normocephalic and atraumatic.  Eyes: Conjunctivae are normal. Pupils are equal, round, and reactive to light.  Neurological: He is alert and oriented to person, place, and time. He has normal strength. A sensory deficit is present. Gait abnormal.  Reflex Scores:      Tricep reflexes are 2+ on the right side and 2+ on the left side.      Bicep reflexes are 2+ on the right side and 2+ on the left side.      Brachioradialis reflexes are 2+ on the right side and 2+ on the left side.      Patellar reflexes are 3+ on the right side and 3+ on the left side.      Achilles reflexes are 0 on the right side and 0 on the left side. 5/5 strength in bilateral deltoid, biceps, triceps, grip, hip flexion, knee extensor, ankle dorsi flexors and plantar flexor  Decreased sensation in the C5 and C6 dermatomes to pinprick bilaterally  Psychiatric: His affect is blunt.  Nursing note and vitals reviewed.         Assessment & Plan:  1. History of cervical myelopathy status post decompression and fusion approximately 10 years ago. Has some chronic neck pain low we discussed that given the chronicity there is likely not much from an interventional standpoint offer.  The tingling in the hands is likely a chronic paresthesia for myelopathy. Will increase gabapentin. Do not think epidural steroid injections are needed at the current time. No new neurologic levels identified on  examination.  2. Chronic low back pain. This is actually a newer problem than the cervical discomfort. He has not had any recent imaging studies of his lower spine. We'll check x-rays. He has decreased range of motion in all directions. No signs of radiculopathy. Suspect lumbar spondylosis given his age. He may benefit from lumbar medial branch blocks  3. Chronic pain syndrome given his history of COPD, history of fallsRelated to chronic myelopathy,as well as history of ethanol abuse would avoid Schedule 2 narcotic analgesics. We'll trial tramadol 50 mg 3  times a day

## 2014-11-27 LAB — PMP ALCOHOL METABOLITE (ETG)

## 2014-11-30 LAB — BENZODIAZEPINES (GC/LC/MS), URINE
ALPRAZOLAMU: NEGATIVE ng/mL (ref <25)
Clonazepam metabolite (GC/LC/MS), ur confirm: NEGATIVE ng/mL (ref <25)
Flurazepam metabolite (GC/LC/MS), ur confirm: NEGATIVE ng/mL (ref <50)
Lorazepam (GC/LC/MS), ur confirm: NEGATIVE ng/mL (ref <50)
Midazolam (GC/LC/MS), ur confirm: NEGATIVE ng/mL (ref <50)
NORDIAZEPAMU: 109 ng/mL — AB (ref <50)
OXAZEPAMU: 656 ng/mL — AB (ref <50)
TEMAZEPAMU: 396 ng/mL — AB (ref <50)
Triazolam metabolite (GC/LC/MS), ur confirm: NEGATIVE ng/mL (ref <50)

## 2014-11-30 LAB — ETHYL GLUCURONIDE, URINE
Ethyl Glucuronide (EtG): 27204 ng/mL — ABNORMAL HIGH (ref <500)
Ethyl Sulfate (ETS): 3567 ng/mL — ABNORMAL HIGH (ref <100)

## 2014-12-01 LAB — PRESCRIPTION MONITORING PROFILE (SOLSTAS)
AMPHETAMINE/METH: NEGATIVE ng/mL
BARBITURATE SCREEN, URINE: NEGATIVE ng/mL
Buprenorphine, Urine: NEGATIVE ng/mL
Cannabinoid Scrn, Ur: NEGATIVE ng/mL
Carisoprodol, Urine: NEGATIVE ng/mL
Cocaine Metabolites: NEGATIVE ng/mL
Creatinine, Urine: 88.85 mg/dL (ref >20.0)
Fentanyl, Ur: NEGATIVE ng/mL
MDMA URINE: NEGATIVE ng/mL
Meperidine, Ur: NEGATIVE ng/mL
Methadone Screen, Urine: NEGATIVE ng/mL
Nitrites, Initial: NEGATIVE ug/mL
OPIATE SCREEN, URINE: NEGATIVE ng/mL
OXYCODONE SCRN UR: NEGATIVE ng/mL
PH URINE, INITIAL: 6.1 pH (ref 4.5–8.9)
Propoxyphene: NEGATIVE ng/mL
TAPENTADOLUR: NEGATIVE ng/mL
TRAMADOL UR: NEGATIVE ng/mL
Zolpidem, Urine: NEGATIVE ng/mL

## 2014-12-10 ENCOUNTER — Encounter (HOSPITAL_COMMUNITY): Payer: Self-pay

## 2014-12-10 ENCOUNTER — Emergency Department (HOSPITAL_COMMUNITY)
Admission: EM | Admit: 2014-12-10 | Discharge: 2014-12-10 | Disposition: A | Discharge status: Home / Self Care | Payer: Medicare Other | Attending: Emergency Medicine | Admitting: Emergency Medicine

## 2014-12-10 ENCOUNTER — Emergency Department (HOSPITAL_COMMUNITY): Payer: Medicare Other

## 2014-12-10 DIAGNOSIS — E785 Hyperlipidemia, unspecified: Secondary | ICD-10-CM | POA: Diagnosis not present

## 2014-12-10 DIAGNOSIS — Z86018 Personal history of other benign neoplasm: Secondary | ICD-10-CM | POA: Insufficient documentation

## 2014-12-10 DIAGNOSIS — R1084 Generalized abdominal pain: Secondary | ICD-10-CM | POA: Diagnosis not present

## 2014-12-10 DIAGNOSIS — Z9181 History of falling: Secondary | ICD-10-CM | POA: Insufficient documentation

## 2014-12-10 DIAGNOSIS — Z79899 Other long term (current) drug therapy: Secondary | ICD-10-CM | POA: Insufficient documentation

## 2014-12-10 DIAGNOSIS — J449 Chronic obstructive pulmonary disease, unspecified: Secondary | ICD-10-CM | POA: Diagnosis not present

## 2014-12-10 DIAGNOSIS — F329 Major depressive disorder, single episode, unspecified: Secondary | ICD-10-CM | POA: Insufficient documentation

## 2014-12-10 DIAGNOSIS — R1030 Lower abdominal pain, unspecified: Secondary | ICD-10-CM | POA: Diagnosis not present

## 2014-12-10 DIAGNOSIS — Z8719 Personal history of other diseases of the digestive system: Secondary | ICD-10-CM | POA: Insufficient documentation

## 2014-12-10 DIAGNOSIS — R109 Unspecified abdominal pain: Secondary | ICD-10-CM | POA: Diagnosis present

## 2014-12-10 DIAGNOSIS — K2970 Gastritis, unspecified, without bleeding: Secondary | ICD-10-CM | POA: Diagnosis not present

## 2014-12-10 DIAGNOSIS — R10817 Generalized abdominal tenderness: Secondary | ICD-10-CM | POA: Diagnosis not present

## 2014-12-10 DIAGNOSIS — F419 Anxiety disorder, unspecified: Secondary | ICD-10-CM | POA: Insufficient documentation

## 2014-12-10 DIAGNOSIS — Z720 Tobacco use: Secondary | ICD-10-CM | POA: Insufficient documentation

## 2014-12-10 DIAGNOSIS — I700 Atherosclerosis of aorta: Secondary | ICD-10-CM | POA: Diagnosis not present

## 2014-12-10 DIAGNOSIS — I10 Essential (primary) hypertension: Secondary | ICD-10-CM | POA: Insufficient documentation

## 2014-12-10 DIAGNOSIS — R319 Hematuria, unspecified: Secondary | ICD-10-CM

## 2014-12-10 LAB — CBC WITH DIFFERENTIAL/PLATELET
Basophils Absolute: 0 10*3/uL (ref 0.0–0.1)
Basophils Relative: 0 % (ref 0–1)
Eosinophils Absolute: 0.1 10*3/uL (ref 0.0–0.7)
Eosinophils Relative: 0 % (ref 0–5)
HEMATOCRIT: 49.9 % (ref 39.0–52.0)
Hemoglobin: 17.8 g/dL — ABNORMAL HIGH (ref 13.0–17.0)
LYMPHS ABS: 1.5 10*3/uL (ref 0.7–4.0)
LYMPHS PCT: 10 % — AB (ref 12–46)
MCH: 34.9 pg — AB (ref 26.0–34.0)
MCHC: 35.7 g/dL (ref 30.0–36.0)
MCV: 97.8 fL (ref 78.0–100.0)
MONO ABS: 1.5 10*3/uL — AB (ref 0.1–1.0)
Monocytes Relative: 10 % (ref 3–12)
Neutro Abs: 11.2 10*3/uL — ABNORMAL HIGH (ref 1.7–7.7)
Neutrophils Relative %: 80 % — ABNORMAL HIGH (ref 43–77)
PLATELETS: 161 10*3/uL (ref 150–400)
RBC: 5.1 MIL/uL (ref 4.22–5.81)
RDW: 12.8 % (ref 11.5–15.5)
WBC: 14.3 10*3/uL — AB (ref 4.0–10.5)

## 2014-12-10 LAB — LIPASE, BLOOD: Lipase: 20 U/L (ref 11–59)

## 2014-12-10 LAB — COMPREHENSIVE METABOLIC PANEL
ALK PHOS: 100 U/L (ref 39–117)
ALT: 11 U/L (ref 0–53)
AST: 19 U/L (ref 0–37)
Albumin: 4.3 g/dL (ref 3.5–5.2)
Anion gap: 9 (ref 5–15)
BUN: 10 mg/dL (ref 6–23)
CO2: 22 mmol/L (ref 19–32)
Calcium: 9.6 mg/dL (ref 8.4–10.5)
Chloride: 106 mmol/L (ref 96–112)
Creatinine, Ser: 1.19 mg/dL (ref 0.50–1.35)
GFR, EST AFRICAN AMERICAN: 71 mL/min — AB (ref >90)
GFR, EST NON AFRICAN AMERICAN: 61 mL/min — AB (ref >90)
GLUCOSE: 100 mg/dL — AB (ref 70–99)
Potassium: 3.6 mmol/L (ref 3.5–5.1)
SODIUM: 137 mmol/L (ref 135–145)
Total Bilirubin: 1.7 mg/dL — ABNORMAL HIGH (ref 0.3–1.2)
Total Protein: 8.7 g/dL — ABNORMAL HIGH (ref 6.0–8.3)

## 2014-12-10 LAB — URINALYSIS, ROUTINE W REFLEX MICROSCOPIC
Glucose, UA: NEGATIVE mg/dL
Ketones, ur: 15 mg/dL — AB
LEUKOCYTES UA: NEGATIVE
NITRITE: NEGATIVE
PH: 6.5 (ref 5.0–8.0)
Protein, ur: 30 mg/dL — AB
Specific Gravity, Urine: 1.02 (ref 1.005–1.030)
Urobilinogen, UA: 2 mg/dL — ABNORMAL HIGH (ref 0.0–1.0)

## 2014-12-10 LAB — URINE MICROSCOPIC-ADD ON

## 2014-12-10 LAB — I-STAT CG4 LACTIC ACID, ED
Lactic Acid, Venous: 0.87 mmol/L (ref 0.5–2.0)
Lactic Acid, Venous: 1.75 mmol/L (ref 0.5–2.0)

## 2014-12-10 LAB — ETHANOL

## 2014-12-10 MED ORDER — ONDANSETRON 4 MG PO TBDP: 4.0000 mg | ORAL_TABLET | Freq: Three times a day (TID) | ORAL | Status: DC | PRN

## 2014-12-10 MED ORDER — ONDANSETRON HCL 4 MG/2ML IJ SOLN
4.0000 mg | Freq: Once | INTRAMUSCULAR | Status: AC
  Administered 2014-12-10: 4 mg via INTRAVENOUS
  Filled 2014-12-10: qty 2

## 2014-12-10 MED ORDER — IOHEXOL 300 MG/ML  SOLN
25.0000 mL | Freq: Once | INTRAMUSCULAR | Status: AC | PRN
  Administered 2014-12-10: 25 mL via ORAL

## 2014-12-10 MED ORDER — SODIUM CHLORIDE 0.9 % IV BOLUS (SEPSIS)
1000.0000 mL | Freq: Once | INTRAVENOUS | Status: AC
  Administered 2014-12-10: 1000 mL via INTRAVENOUS

## 2014-12-10 MED ORDER — IOHEXOL 300 MG/ML  SOLN
100.0000 mL | Freq: Once | INTRAMUSCULAR | Status: AC | PRN
  Administered 2014-12-10: 100 mL via INTRAVENOUS

## 2014-12-10 MED ORDER — HYDROMORPHONE HCL 1 MG/ML IJ SOLN
1.0000 mg | Freq: Once | INTRAMUSCULAR | Status: AC
Start: 2014-12-10 — End: 2014-12-10
  Administered 2014-12-10: 1 mg via INTRAVENOUS
  Filled 2014-12-10: qty 1

## 2014-12-10 MED ORDER — MORPHINE SULFATE 4 MG/ML IJ SOLN
4.0000 mg | Freq: Once | INTRAMUSCULAR | Status: AC
  Administered 2014-12-10: 4 mg via INTRAVENOUS
  Filled 2014-12-10: qty 1

## 2014-12-10 MED ORDER — HYDROCODONE-ACETAMINOPHEN 5-325 MG PO TABS: 1.0000 | ORAL_TABLET | ORAL | Status: DC | PRN

## 2014-12-10 NOTE — ED Notes (Signed)
Per [facility], pt here for lower bilateral abd pain for the past 4 days. No N/V/D. Has had trouble eating and drinking d/t the abd pain.

## 2014-12-10 NOTE — ED Provider Notes (Addendum)
TIME SEEN: 11:15 AM    CHIEF COMPLAINT: Abdominal pain  HPI: Pt is a 69 y.o. male with history of hypertension, hyperlipidemia, alcohol abuse, COPD, GERD who presents emergency department with complaints of diffuse severe abdominal pain worse in the lower region for the past 4 days. No aggravating or relieving factors. Described as "excruciating" but is not able to describe it further. No radiation. Denies fevers, chills, nausea, vomiting, diarrhea, dysuria or hematuria, penile discharge, testicular pain or swelling. Has never had similar pain. Denies prior abdominal surgery. No sick contacts or recent travel.  ROS: See HPI Constitutional: no fever  Eyes: no drainage  ENT: no runny nose   Cardiovascular:  no chest pain  Resp: no SOB  GI: no vomiting GU: no dysuria Integumentary: no rash  Allergy: no hives  Musculoskeletal: no leg swelling  Neurological: no slurred speech ROS otherwise negative  PAST MEDICAL HISTORY/PAST SURGICAL HISTORY:  Past Medical History  Diagnosis Date  . HTN (hypertension)   . Hyperlipemia   . GERD (gastroesophageal reflux disease)   . History of migraine headaches   . Tobacco use   . Personal history of alcoholism   . COPD (chronic obstructive pulmonary disease)   . History of fall   . Tubular adenoma 01/29/2008  . Anxiety and depression     MEDICATIONS:  Prior to Admission medications   Medication Sig Start Date End Date Taking? Authorizing Provider  albuterol (PROVENTIL HFA;VENTOLIN HFA) 108 (90 BASE) MCG/ACT inhaler Inhale 2 puffs into the lungs 4 (four) times daily. 08/23/14   Blain Pais, MD  atorvastatin (LIPITOR) 20 MG tablet Take 1 tablet (20 mg total) by mouth daily. 10/05/14   Blain Pais, MD  furosemide (LASIX) 20 MG tablet Take 1 tablet (20 mg total) by mouth daily as needed for edema (for leg swelling). 05/24/14 05/24/15  Blain Pais, MD  gabapentin (NEURONTIN) 300 MG capsule Take 1 capsule (300 mg total) by mouth 3  (three) times daily. 11/26/14 11/26/15  Charlett Blake, MD  tiotropium (SPIRIVA HANDIHALER) 18 MCG inhalation capsule Place 1 capsule (18 mcg total) into inhaler and inhale daily. 10/05/14 10/05/15  Blain Pais, MD  topiramate (TOPAMAX) 100 MG tablet Take 1 tablet (100 mg total) by mouth 2 (two) times daily. 10/05/14   Blain Pais, MD  traMADol (ULTRAM) 50 MG tablet Take 1 tablet (50 mg total) by mouth 3 (three) times daily as needed. 11/26/14   Charlett Blake, MD  traZODone (DESYREL) 100 MG tablet Take 1 tablet (100 mg total) by mouth at bedtime. 10/05/14   Blain Pais, MD  verapamil (VERELAN PM) 120 MG 24 hr capsule Take 1 capsule (120 mg total) by mouth at bedtime. 10/05/14   Blain Pais, MD    ALLERGIES:  No Known Allergies  SOCIAL HISTORY:  History  Substance Use Topics  . Smoking status: Current Every Day Smoker -- 0.40 packs/day for 55 years    Types: Cigarettes  . Smokeless tobacco: Not on file     Comment: Has cut down to half pack per day, TRYING TO QUIT  . Alcohol Use: No     Comment: History of heavy alcohol use until 2005. Pt had two serious mechanical falls with fracture of cervical spine 2/2 EtOH use.    FAMILY HISTORY: Family History  Problem Relation Age of Onset  . Heart attack Mother   . Heart attack Father   . Stroke Sister   . Heart attack Brother   .  Heart attack Sister     EXAM: BP 117/77 mmHg  Pulse 87  Temp(Src) 98.4 F (36.9 C) (Oral)  Resp 12  SpO2 97% CONSTITUTIONAL: Alert and oriented and responds appropriately to questions. Well-appearing; well-nourished, pleasant, nontoxic, in no significant distress HEAD: Normocephalic EYES: Conjunctivae clear, PERRL ENT: normal nose; no rhinorrhea; moist mucous membranes; pharynx without lesions noted NECK: Supple, no meningismus, no LAD  CARD: RRR; S1 and S2 appreciated; no murmurs, no clicks, no rubs, no gallops RESP: Normal chest excursion without splinting or  tachypnea; breath sounds clear and equal bilaterally; no wheezes, no rhonchi, no rales,  ABD/GI: Normal bowel sounds; non-distended; soft, diffusely tender throughout his abdomen with voluntary guarding, no rebound, no peritoneal signs GU:  Circumcised male, normal extra genitalia, no penile discharge, patient only has one testicle and it is not tender to palpation with no scrotal masses or testicular masses, 2+ femoral pulses bilaterally BACK:  The back appears normal and is non-tender to palpation, there is no CVA tenderness EXT: Normal ROM in all joints; non-tender to palpation; no edema; normal capillary refill; no cyanosis    SKIN: Normal color for age and race; warm NEURO: Moves all extremities equally PSYCH: The patient's mood and manner are appropriate. Grooming and personal hygiene are appropriate.  MEDICAL DECISION MAKING: Patient here with complaints of diffuse abdominal pain and is tender diffusely with voluntary guarding. Abdomen however is nonsurgical. He is afebrile, hemodynamically stable and nontoxic appearing. We'll give IV fluids, pain and nausea medicine. Will obtain abdominal labs, urine and a CT of his abdomen and pelvis. We'll also obtain lactate.  ED PROGRESS: Patient has a leukocytosis with left shift but has had 2 normal lactates. LFTs, lipase normal. Urine did shows moderate hemoglobin and few bacteria but he denies dysuria, gross hematuria, urinary frequency or urgency. No penile discharge or testicular pain. CT scan shows no acute abnormality. Appendix appears normal. Denies prior history of kidney stones and has no flank pain on exam. Have advised him to eat a bland diet and increase water intake. Avoid alcohol, spicy, acidic or greasy foods. We'll have him follow-up with his primary care physician. We'll discharge with prescription for Vicodin, Zofran to take as needed. Discussed return precautions. He verbalizes understanding and is comfortable with plan.   Patient  reports he has an appointment with his PCP on 12/22/14.  [place], DO 12/10/14 [place], DO 12/10/14 1510

## 2014-12-10 NOTE — Discharge Instructions (Signed)
Abdominal Pain Many things can cause abdominal pain. Usually, abdominal pain is not caused by a disease and will improve without treatment. It can often be observed and treated at home. Your health care provider will do a physical exam and possibly order blood tests and X-rays to help determine the seriousness of your pain. However, in many cases, more time must pass before a clear cause of the pain can be found. Before that point, your health care provider may not know if you need more testing or further treatment. HOME CARE INSTRUCTIONS  Monitor your abdominal pain for any changes. The following actions may help to alleviate any discomfort you are experiencing:  Only take over-the-counter or prescription medicines as directed by your health care provider.  Do not take laxatives unless directed to do so by your health care provider.  Try a clear liquid diet (broth, tea, or water) as directed by your health care provider. Slowly move to a bland diet as tolerated. SEEK MEDICAL CARE IF:  You have unexplained abdominal pain.  You have abdominal pain associated with nausea or diarrhea.  You have pain when you urinate or have a bowel movement.  You experience abdominal pain that wakes you in the night.  You have abdominal pain that is worsened or improved by eating food.  You have abdominal pain that is worsened with eating fatty foods.  You have a fever. SEEK IMMEDIATE MEDICAL CARE IF:   Your pain does not go away within 2 hours.  You keep throwing up (vomiting).  Your pain is felt only in portions of the abdomen, such as the right side or the left lower portion of the abdomen.  You pass bloody or black tarry stools. MAKE SURE YOU:  Understand these instructions.   Will watch your condition.   Will get help right away if you are not doing well or get worse.  Document Released: 07/04/2005 Document Revised: 09/29/2013 Document Reviewed: 06/03/2013 [facility] Patient Information  2015 [place], [place]. This information is not intended to replace advice given to you by your health care provider. Make sure you discuss any questions you have with your health care provider.  Hematuria Hematuria is blood in your urine. It can be caused by a bladder infection, kidney infection, prostate infection, kidney stone, or cancer of your urinary tract. Infections can usually be treated with medicine, and a kidney stone usually will pass through your urine. If neither of these is the cause of your hematuria, further workup to find out the reason may be needed. It is very important that you tell your health care provider about any blood you see in your urine, even if the blood stops without treatment or happens without causing pain. Blood in your urine that happens and then stops and then happens again can be a symptom of a very serious condition. Also, pain is not a symptom in the initial stages of many urinary cancers. HOME CARE INSTRUCTIONS   Drink lots of fluid, 3-4 quarts a day. If you have been diagnosed with an infection, cranberry juice is especially recommended, in addition to large amounts of water.  Avoid caffeine, tea, and carbonated beverages because they tend to irritate the bladder.  Avoid alcohol because it may irritate the prostate.  Take all medicines as directed by your health care provider.  If you were prescribed an antibiotic medicine, finish it all even if you start to feel better.  If you have been diagnosed with a kidney stone, follow your health  care provider's instructions regarding straining your urine to catch the stone.  Empty your bladder often. Avoid holding urine for long periods of time.  After a bowel movement, women should cleanse front to back. Use each tissue only once.  Empty your bladder before and after sexual intercourse if you are a male. SEEK MEDICAL CARE IF:  You develop back pain.  You have a fever.  You have a feeling of sickness in  your stomach (nausea) or vomiting.  Your symptoms are not better in 3 days. Return sooner if you are getting worse. SEEK IMMEDIATE MEDICAL CARE IF:   You develop severe vomiting and are unable to keep the medicine down.  You develop severe back or abdominal pain despite taking your medicines.  You begin passing a large amount of blood or clots in your urine.  You feel extremely weak or faint, or you pass out. MAKE SURE YOU:   Understand these instructions.  Will watch your condition.  Will get help right away if you are not doing well or get worse. Document Released: 09/24/2005 Document Revised: 02/08/2014 Document Reviewed: 05/25/2013 [facility] Patient Information 2015 [place], [place]. This information is not intended to replace advice given to you by your health care provider. Make sure you discuss any questions you have with your health care provider.

## 2014-12-13 LAB — URINE CULTURE: Colony Count: 15000

## 2014-12-14 ENCOUNTER — Telehealth (HOSPITAL_BASED_OUTPATIENT_CLINIC_OR_DEPARTMENT_OTHER): Payer: Self-pay | Admitting: Emergency Medicine

## 2014-12-14 NOTE — Telephone Encounter (Signed)
Post ED Visit - Positive Culture Follow-up  Culture report reviewed by antimicrobial stewardship pharmacist: []  Wes Dulaney, Pharm.D., BCPS [x]  Heide Guile, Pharm.D., BCPS []  Alycia Rossetti, Pharm.D., BCPS []  [place], Pharm.D., BCPS, AAHIVP []  Legrand Como, Pharm.D., BCPS, AAHIVP []  Isac Sarna, Pharm.D., BCPS  Positive urine culture Group B strep Treated with none, asymptomatic and no further patient follow-up is required at this time.  Hazle Nordmann 12/14/2014, 2:22 PM

## 2014-12-17 NOTE — Progress Notes (Signed)
Urine drug screen for this encounter is inconsistent . Positive for metabolites of unprescribed diazepam and positive for ETOH

## 2014-12-23 ENCOUNTER — Telehealth: Payer: Self-pay | Admitting: Internal Medicine

## 2014-12-23 NOTE — Telephone Encounter (Signed)
Call to patient to confirm appointment for 12/27/14 at 1:45 lmtcb

## 2014-12-27 ENCOUNTER — Ambulatory Visit (INDEPENDENT_AMBULATORY_CARE_PROVIDER_SITE_OTHER): Payer: Medicare Other | Admitting: Internal Medicine

## 2014-12-27 ENCOUNTER — Encounter: Payer: Self-pay | Admitting: Pharmacist

## 2014-12-27 ENCOUNTER — Encounter: Payer: Self-pay | Admitting: Internal Medicine

## 2014-12-27 VITALS — BP 111/65 | HR 82 | Temp 97.7°F | Wt 144.8 lb

## 2014-12-27 DIAGNOSIS — I10 Essential (primary) hypertension: Secondary | ICD-10-CM

## 2014-12-27 DIAGNOSIS — Z720 Tobacco use: Secondary | ICD-10-CM

## 2014-12-27 DIAGNOSIS — Z87448 Personal history of other diseases of urinary system: Secondary | ICD-10-CM | POA: Diagnosis not present

## 2014-12-27 DIAGNOSIS — F17210 Nicotine dependence, cigarettes, uncomplicated: Secondary | ICD-10-CM | POA: Diagnosis not present

## 2014-12-27 DIAGNOSIS — J449 Chronic obstructive pulmonary disease, unspecified: Secondary | ICD-10-CM

## 2014-12-27 LAB — POCT URINALYSIS DIPSTICK
Bilirubin, UA: NEGATIVE
Glucose, UA: NEGATIVE
KETONES UA: NEGATIVE
Leukocytes, UA: NEGATIVE
Nitrite, UA: NEGATIVE
PH UA: 7
Protein, UA: NEGATIVE
SPEC GRAV UA: 1.02
Urobilinogen, UA: 1

## 2014-12-27 MED ORDER — NICOTINE POLACRILEX 2 MG MT LOZG: 2.0000 mg | LOZENGE | OROMUCOSAL | Status: DC | PRN

## 2014-12-27 MED ORDER — BUPROPION HCL ER (SR) 150 MG PO TB12: 150.0000 mg | ORAL_TABLET | Freq: Two times a day (BID) | ORAL | Status: DC

## 2014-12-27 MED ORDER — TIOTROPIUM BROMIDE MONOHYDRATE 18 MCG IN CAPS
18.0000 ug | ORAL_CAPSULE | Freq: Every day | RESPIRATORY_TRACT | Status: DC
Start: 2014-12-27 — End: 2016-04-20

## 2014-12-27 NOTE — Assessment & Plan Note (Signed)
Urine dipstick with trace blood (had moderate Hgb during his ED visit two weeks ago). Will recheck urinalysis for blood in 1 month. He currently has no urinary symptoms but may need referral to Urology if hematuria is persistent.

## 2014-12-27 NOTE — Assessment & Plan Note (Signed)
BP Readings from Last 3 Encounters:  12/27/14 111/65  12/10/14 109/86  11/26/14 115/65    Lab Results  Component Value Date   NA 137 12/10/2014   K 3.6 12/10/2014   CREATININE 1.19 12/10/2014    Assessment: Blood pressure control:  controlled Progress toward BP goal:   at goal Comments: he is on verapamil 120mg  daily  Plan: Medications:  continue current medications Educational resources provided:   Self management tools provided:   Other plans: Follow up in 6 months.

## 2014-12-27 NOTE — Assessment & Plan Note (Signed)
Stable. Uses albuterol inhaler 1-2 times per week.  Refilled his Spiriva

## 2014-12-27 NOTE — Progress Notes (Signed)
   Subjective:    Patient ID: [name], male    DOB: November 16, 1945, 69 y.o.   MRN: [id number]  HPI [name] is a 69 yr old man with PMH of HTN, tobacco smoking, chronic back pain, presenting for routine follow up visit. Since his last visit with me, his shingles rash has cleared up and he has had no residual neuralgia. He was seen in the ED on 3/4 for epigastric/upper right quadrant abdominal pain. He reports that he had stomach pain for almost 3 weeks prior to that visit with no nausea, diarrhea, or constipation. He had CT abdomen at that time that was unremarkable for the etiology of this pain. His labs were remarkable for leucocytosis of 14K, and UA with moderate hemoglobin. He denies dysuria, increased urinary frequency at that time or since then. He was discharged home with a bland diet and notes that his pain went away on its own.   He continues to see Dr. Letta Pate for pain management.   He as called the 1-800-QUIT-NOW but has not been able to quit smoking. In the past he has tried nicotine patches and nicotine gum with no success in his smoking cessation efforts. He is interested in trying a prescription medication to help him quit smoking.    Review of Systems  Constitutional: Negative for fever, chills, diaphoresis, activity change, appetite change and fatigue.  Respiratory: Negative for cough and shortness of breath.   Cardiovascular: Negative for chest pain and leg swelling.  Gastrointestinal: Positive for abdominal pain. Negative for nausea, vomiting, diarrhea, constipation, blood in stool and abdominal distention.  Genitourinary: Negative for dysuria and difficulty urinating.  Musculoskeletal: Positive for back pain.  Skin: Negative for rash.  Neurological: Negative for dizziness and light-headedness.  Psychiatric/Behavioral: Negative for agitation.       Objective:   Physical Exam  Constitutional: He is oriented to person, place, and time. He appears well-developed  and well-nourished. No distress.  Cardiovascular: Normal rate and regular rhythm.   Pulmonary/Chest: Effort normal and breath sounds normal. No respiratory distress. He has no wheezes. He has no rales.  Abdominal: Soft. Bowel sounds are normal. He exhibits no distension. There is no tenderness. There is no rebound and no guarding.  Musculoskeletal: He exhibits no edema.  Neurological: He is alert and oriented to person, place, and time. Coordination normal.  Skin: Skin is warm and dry. He is not diaphoretic.  Psychiatric: He has a normal mood and affect.  Nursing note and vitals reviewed.         Assessment & Plan:

## 2014-12-27 NOTE — Patient Instructions (Addendum)
General Instructions: -Start taking Bupropion as instructed.  -Follow up in 6 months or sooner as needed.    Thank you for bringing your medicines today. This helps [place] keep you safe from mistakes.   Progress Toward Treatment Goals:  Treatment Goal 12/27/2014  Blood pressure at goal  Stop smoking smoking the same amount    Self Care Goals & Plans:  Self Care Goal 12/27/2014  Manage my medications bring my medications to every visit; take my medicines as prescribed; refill my medications on time  Eat healthy foods -  Be physically active -  Stop smoking go to the [employer] (https://[employer]/)  Meeting treatment goals maintain the current self-care plan    No flowsheet data found.   Care Management & Community Referrals:  Referral 12/27/2014  Referrals made for care management support smoking cessation counselor      Congratulations on your decision to stop smoking! We have set your quit date for 01/10/15 and will work with you along the way.  Bupropion sustained-release tablets (smoking cessation) Start taking this medicine today, one tablet a day for 3 days, and then increase to twice daily. Take the second dose at dinner so it does not bother your sleep. What is this medicine? BUPROPION (byoo PROE pee on) is used to help people quit smoking. This medicine may be used for other purposes; ask your health care provider or pharmacist if you have questions. COMMON BRAND NAME(S): Buproban, Zyban What should I tell my health care provider before I take this medicine? They need to know if you have any of these conditions: -an eating disorder, such as anorexia or bulimia -bipolar disorder or psychosis -diabetes or high blood sugar, treated with medication -glaucoma -head injury or brain tumor -heart disease, previous heart attack, or irregular heart beat -high blood pressure -kidney or liver disease -seizures -suicidal thoughts or a previous suicide  attempt -Tourette's syndrome -weight loss -an unusual or allergic reaction to bupropion, other medicines, foods, dyes, or preservatives -breast-feeding -pregnant or trying to become pregnant How should I use this medicine? Take this medicine by mouth with a glass of water. Follow the directions on the prescription label. You can take it with or without food. If it upsets your stomach, take it with food. Do not cut, crush or chew this medicine. Take your medicine at regular intervals. If you take this medicine more than once a day, take your second dose at least 8 hours after you take your first dose. To limit difficulty in sleeping, avoid taking this medicine at bedtime. Do not take your medicine more often than directed. Do not stop taking this medicine suddenly except upon the advice of your doctor. Stopping this medicine too quickly may cause serious side effects. A special MedGuide will be given to you by the pharmacist with each prescription and refill. Be sure to read this information carefully each time. Talk to your pediatrician regarding the use of this medicine in children. Special care may be needed. Overdosage: If you think you have taken too much of this medicine contact a poison control center or emergency room at once. NOTE: This medicine is only for you. Do not share this medicine with others. What if I miss a dose? If you miss a dose, skip the missed dose and take your next tablet at the regular time. There should be at least 8 hours between doses. Do not take double or extra doses. What may interact with this medicine? Do not take this medicine  with any of the following medications: -linezolid -MAOIs like Azilect, Carbex, Eldepryl, Marplan, Nardil, and Parnate -methylene blue (injected into a vein) -other medicines that contain bupropion like Wellbutrin This medicine may also interact with the following medications: -alcohol -certain medicines for anxiety or sleep -certain  medicines for blood pressure like metoprolol, propranolol -certain medicines for depression or psychotic disturbances -certain medicines for HIV or AIDS like efavirenz, lopinavir, nelfinavir, ritonavir -certain medicines for irregular heart beat like propafenone, flecainide -certain medicines for Parkinson's disease like amantadine, levodopa -certain medicines for seizures like carbamazepine, phenytoin, phenobarbital -cimetidine -clopidogrel -cyclophosphamide -furazolidone -isoniazid -nicotine -orphenadrine -procarbazine -steroid medicines like prednisone or cortisone -stimulant medicines for attention disorders, weight loss, or to stay awake -tamoxifen -theophylline -thiotepa -ticlopidine -tramadol -warfarin This list may not describe all possible interactions. Give your health care provider a list of all the medicines, herbs, non-prescription drugs, or dietary supplements you use. Also tell them if you smoke, drink alcohol, or use illegal drugs. Some items may interact with your medicine. What should I watch for while using this medicine? Visit your doctor or health care professional for regular checks on your progress. This medicine should be used together with a patient support program. It is important to participate in a behavioral program, counseling, or other support program that is recommended by your health care professional. Patients and their families should watch out for new or worsening thoughts of suicide or depression. Also watch out for sudden changes in feelings such as feeling anxious, agitated, panicky, irritable, hostile, aggressive, impulsive, severely restless, overly excited and hyperactive, or not being able to sleep. If this happens, especially at the beginning of treatment or after a change in dose, call your health care professional. Avoid alcoholic drinks while taking this medicine. Drinking excessive alcoholic beverages, using sleeping or anxiety medicines, or  quickly stopping the use of these agents while taking this medicine may increase your risk for a seizure. Do not drive or use heavy machinery until you know how this medicine affects you. This medicine can impair your ability to perform these tasks. Do not take this medicine close to bedtime. It may prevent you from sleeping. Your mouth may get dry. Chewing sugarless gum or sucking hard candy, and drinking plenty of water may help. Contact your doctor if the problem does not go away or is severe. Do not use nicotine patches or chewing gum without the advice of your doctor or health care professional while taking this medicine. You may need to have your blood pressure taken regularly if your doctor recommends that you use both nicotine and this medicine together. What side effects may I notice from receiving this medicine? Side effects that you should report to your doctor or health care professional as soon as possible: -allergic reactions like skin rash, itching or hives, swelling of the face, lips, or tongue -breathing problems -changes in vision -confusion -fast or irregular heartbeat -hallucinations -increased blood pressure -redness, blistering, peeling or loosening of the skin, including inside the mouth -seizures -suicidal thoughts or other mood changes -unusually weak or tired -vomiting Side effects that usually do not require medical attention (report to your doctor or health care professional if they continue or are bothersome): -change in sex drive or performance -constipation -headache -loss of appetite -nausea -tremors -weight loss This list may not describe all possible side effects. Call your doctor for medical advice about side effects. You may report side effects to FDA at 1-800-FDA-1088. Where should I keep my medicine? Keep out  of the reach of children. Store at room temperature between 20 and 25 degrees C (68 and 77 degrees F). Protect from light. Keep container  tightly closed. Throw away any unused medicine after the expiration date. NOTE: This sheet is a summary. It may not cover all possible information. If you have questions about this medicine, talk to your doctor, pharmacist, or health care provider.  2015, Elsevier/Gold Standard. (2013-05-22 10:55:10)   Nicotine chewing gum Start the gum on your quit date (01/10/15) Do not eat or drink until after you spit out the gum. Chew the gum until you feel a peppery taste, then park in your cheek. Once peppery taste goes away, chew again and park in cheek. Repeat for about 30 minutes then spit out. Do this every 1 to 2 hours.  What is this medicine? NICOTINE ([place]) helps people stop smoking. This medicine replaces the nicotine found in cigarettes and helps to decrease withdrawal effects. It is most effective when used in combination with a stop-smoking program. This medicine may be used for other purposes; ask your health care provider or pharmacist if you have questions. COMMON BRAND NAME(S): NICOrelief, Nicorette What should I tell my health care provider before I take this medicine? They need to know if you have any of these conditions: -diabetes -heart disease, angina, irregular heartbeat or previous heart attack -lung disease, including asthma -overactive thyroid -pheochromocytoma -stomach problems or ulcers -an unusual or allergic reaction to nicotine, other medicines, foods, dyes, or preservatives -pregnant or trying to get pregnant -breast-feeding How should I use this medicine? Chew but do not swallow the gum. Follow the directions that come with the chewing gum. Use exactly as directed. When you feel an urgent desire for a cigarette, chew one piece of gum slowly. Continue chewing until you taste the gum or feel a slight tingling in your mouth. Then, stop chewing and place the gum between your cheek and gum. Wait until the taste or tingling is almost gone then start chewing again.  Continue chewing in this manner for about 30 minutes. Slow chewing helps reduce cravings and also helps reduce the chance for heartburn or other gastrointestinal side effects. Talk to your pediatrician regarding the use of this medicine in children. Special care may be needed. Overdosage: If you think you have taken too much of this medicine contact a poison control center or emergency room at once. NOTE: This medicine is only for you. Do not share this medicine with others. What if I miss a dose? This does not apply. Only use the chewing gum when you have a strong desire to smoke. Do not use more than one piece of gum at a time. What may interact with this medicine? -medicines for asthma -medicines for blood pressure -medicines for mental depression This list may not describe all possible interactions. Give your health care provider a list of all the medicines, herbs, non-prescription drugs, or dietary supplements you use. Also tell them if you smoke, drink alcohol, or use illegal drugs. Some items may interact with your medicine. What should I watch for while using this medicine? Always carry the nicotine gum with you. Do not use more than 30 pieces of gum a day. Too much gum can increase the risk of an overdose. As the urge to smoke gets less, gradually reduce the number of pieces each day over a period of 2 to 3 months. When you are only using 1 or 2 pieces a day, stop using the nicotine  gum. You should begin using the nicotine gum the day you stop smoking. It is okay if you do not succeed with the attempt to quit and have a cigarette. You can still continue your quit attempt and keep using the product as directed. Just throw away your cigarettes and get back to your quit plan. If your mouth gets sore from chewing the gum, suck hard sugarless candy between pieces of gum to help relieve the soreness. Brush your teeth regularly to reduce mouth irritation. If you wear dentures, contact your doctor or  health care professional if the gum sticks to your dental work. If you are a diabetic and you quit smoking, the effects of insulin may be increased and you may need to reduce your insulin dose. Check with your doctor or health care professional about how you should adjust your insulin dose. What side effects may I notice from receiving this medicine? Side effects that you should report to your doctor or health care professional as soon as possible: -allergic reactions like skin rash, itching or hives, swelling of the face, lips, or tongue -blisters in mouth -breathing problems -changes in hearing -changes in vision -chest pain -cold sweats -confusion -fast, irregular heartbeat -feeling faint or lightheaded, falls -headache -increased saliva -nausea, vomiting -stomach pain -weakness Side effects that usually do not require medical attention (report to your doctor or health care professional if they continue or are bothersome): -diarrhea -dry mouth -hiccups -irritability -nervousness or restlessness -trouble sleeping or vivid dreams This list may not describe all possible side effects. Call your doctor for medical advice about side effects. You may report side effects to FDA at 1-800-FDA-1088. Where should I keep my medicine? Keep out of the reach of children. Store at room temperature between 15 and 30 degrees C (59 and 86 degrees F). Protect from heat and light. Throw away unused medicine after the expiration date. NOTE: This sheet is a summary. It may not cover all possible information. If you have questions about this medicine, talk to your doctor, pharmacist, or health care provider.  2015, Elsevier/Gold Standard. (2013-10-14 12:43:16)

## 2014-12-27 NOTE — Assessment & Plan Note (Addendum)
  Assessment: Progress toward smoking cessation:   interested in quitting but unable to do so with nicotine gum and nicotine patch Barriers to progress toward smoking cessation:   ready to quit, wants pharmacological help Comments: He met with Dr. Maudie Mercury, the [facility] Pharmacist, and discussed initiation of tx with Bupropion for smoking cessation.   Plan: Instruction/counseling given:  I counseled patient on the dangers of tobacco use, advised patient to stop smoking, and reviewed strategies to maximize success. Educational resources provided:    Self management tools provided:    Medications to assist with smoking cessation:  Bupropion (Zyban) Patient agreed to the following self-care plans for smoking cessation:    Other plans: Follow up in 3 weeks. He prefers to have Dr. Maudie Mercury call him in 2 weeks for SE check instead of coming in person for an OV.

## 2014-12-28 ENCOUNTER — Ambulatory Visit (HOSPITAL_BASED_OUTPATIENT_CLINIC_OR_DEPARTMENT_OTHER): Payer: Medicare Other | Admitting: Physical Medicine & Rehabilitation

## 2014-12-28 ENCOUNTER — Encounter: Payer: Medicare Other | Attending: Physical Medicine & Rehabilitation

## 2014-12-28 ENCOUNTER — Encounter: Payer: Self-pay | Admitting: Physical Medicine & Rehabilitation

## 2014-12-28 VITALS — BP 97/56 | HR 72 | Resp 14

## 2014-12-28 DIAGNOSIS — G8929 Other chronic pain: Secondary | ICD-10-CM | POA: Diagnosis not present

## 2014-12-28 DIAGNOSIS — M545 Low back pain: Secondary | ICD-10-CM | POA: Diagnosis not present

## 2014-12-28 DIAGNOSIS — Z5181 Encounter for therapeutic drug level monitoring: Secondary | ICD-10-CM | POA: Insufficient documentation

## 2014-12-28 DIAGNOSIS — B029 Zoster without complications: Secondary | ICD-10-CM | POA: Insufficient documentation

## 2014-12-28 DIAGNOSIS — B019 Varicella without complication: Secondary | ICD-10-CM

## 2014-12-28 DIAGNOSIS — G894 Chronic pain syndrome: Secondary | ICD-10-CM | POA: Insufficient documentation

## 2014-12-28 DIAGNOSIS — Z79899 Other long term (current) drug therapy: Secondary | ICD-10-CM | POA: Diagnosis not present

## 2014-12-28 MED ORDER — TRAMADOL HCL 50 MG PO TABS: 50.0000 mg | ORAL_TABLET | Freq: Three times a day (TID) | ORAL | Status: DC | PRN

## 2014-12-28 MED ORDER — GABAPENTIN 300 MG PO CAPS: 300.0000 mg | ORAL_CAPSULE | Freq: Three times a day (TID) | ORAL | Status: DC

## 2014-12-28 NOTE — Progress Notes (Signed)
Case discussed with Dr. Hayes Ludwig soon after the resident saw the patient.  We reviewed the resident's history and exam and pertinent patient test results.  I agree with the assessment, diagnosis, and plan of care documented in the resident's note.  Had 3-6 RBCs per HPF on recent UA.  Will require a repeat UA.  If he has 3 or more RBC/HPF he will require evaluation for microscopic hematuria.  This work-up can be started by obtaining a kidney stone protocol CT scan to assess for nephrolithiasis and renal mass.  If negative, then he should be referred to Urology for cystoscopy to assess the bladder.

## 2014-12-28 NOTE — Progress Notes (Signed)
  [facility]   Name: [name] DOB:1945-11-26 MRN: [id number]  Date:12/28/2014  Physician: Alysia Penna, MD    Nurse/CMA: Mancel Parsons  Allergies: No Known Allergies  Consent Signed: Yes.    Is patient diabetic? No.  CBG today?   Pregnant: No. LMP: No LMP for male patient. (age [age])  Anticoagulants: no Anti-inflammatory: no Antibiotics: no  Procedure:Bilateral Medial Branvch Block Position: Prone Start Time:  End Time:   Fluoro Time:   RN/CMA Daryel November    Time 1:55 PM     BP 97/576     Pulse 64     Respirations 14     O2 Sat 97     S/S 6     Pain Level 0/10      D/C home with [name] (pastor), patient A & O X 3, D/C instructions reviewed, and sits independently.

## 2014-12-28 NOTE — Progress Notes (Signed)
69 year old male with history of cervical stenosis and myelopathy as well as chronic low back pain. He has a history of ethanol abuse. Urine drug screen showed positive ethanol use note he told me he had not had any alcoholic beverages in about 10 years. Patient has had excellent pain relief on accommodation of tramadol 50 g 3 times a day and gabapentin 300 mg 3 times a day He returns today for injection however tells me that his back pain no longer is an issue.  General No acute distress, affect appropriate Please see procedure record for vital signs. Ambulation without evidence of toe drag or knee instability  Impression 1. Cervical stenosis cervical myelopathy continue tramadol 50 g 3 times a day as well as gabapentin 3 g 3 times a day  2. Chronic low back pain seems to have improved after starting the above medications.  We'll hold off on medial branch blocks if he has an exacerbation of pain will reschedule Return to clinic in 6 months

## 2014-12-28 NOTE — Progress Notes (Addendum)
Patient ID: [name], male   DOB: [birth year], 69 y.o.   MRN: [id number]  S:  Patient was reviewed by clinical pharmacist for assistance with tobacco cessation.   Tobacco Use History  Age when started using tobacco on a daily basis 11.  Type: cigarettes.  Estimated nicotine content: 120 mg per day.   Smokes first cigarette 10 minutes after waking.  Does not wake at night to smoke  Fagerstrom Score 5/10.  Triggers include psychological: caffeine, house.  Quit Attempt History   Patient reports he has tried cutting back over the past 2 years but has not been successful.  Methods tried in the past include Nicotine Patch, gum, and electronic cigarettes.  Rates IMPORTANCE of quitting tobacco on 1-10 scale of 10.  Rates READINESS of quitting tobacco on 1-10 scale of 10.  Rates CONFIDENCE of quitting tobacco on 1-10 scale of 10.  Motivators to quitting include health and financial; barriers include feeling like he has reached a plateau   A/P: Nicotine Dependence: moderate, over 50 years duration in a patient who is good candidate for success.     Reviewed options with patient for shared decision-making and he states he is not interested in patches or varenicline at this time. Patient denies seizure history. Initiated Bupropion (Zyban) and gum. Treatment was reviewed with the patient, including name, instructions, goals of therapy, potential side effects (advised patient to take second dose at dinner), importance of adherence, and safe use.  Reviewed potential challenges and coping skills/strategies with patient. Provided information on 1 800-QUIT NOW support program and advised patient to contact me if questions/concerns arise. Patient verbalized understanding of information by repeating back.  Follow up in 2 week(s) prior to quit date.

## 2015-01-07 ENCOUNTER — Telehealth: Payer: Self-pay | Admitting: Pharmacist

## 2015-01-07 NOTE — Telephone Encounter (Signed)
Patient started bupropion 2 weeks ago and reports no side effects at this time. Patient states he may have financial barriers to obtaining nicotine gum and will try today. Scheduled quit date 01/10/15. He continues to be engaged and will contact us if further concerns arise. Patient will need financial help with medications overall so will follow up with patient in 1-2 weeks for a medication review.  Smoking cessation instruction/counseling given:  commended patient and reviewed strategies for preventing relapses.

## 2015-01-31 ENCOUNTER — Telehealth: Payer: Self-pay | Admitting: Pharmacist

## 2015-01-31 NOTE — Telephone Encounter (Signed)
S:  Patient was contacted by clinical pharmacist for assistance with tobacco cessation.   A/P: Current regimen includes bupropion + nicotine gum. Patient reports no nicotine use x 1 month. Triggers include daily routine (being at home, waking up, drinking coffee in the morning). Patient reports chewing gum and candy help with cravings, no side effects from bupropion.  Patient advised to contact me if questions/concerns arise. Patient verbalized understanding of information by repeating back.  Follow up in 1 month(s)  Kim,Jennifer J 5:31 PM 01/31/2015

## 2015-02-18 ENCOUNTER — Encounter: Payer: Self-pay | Admitting: *Deleted

## 2015-06-24 ENCOUNTER — Other Ambulatory Visit: Payer: Self-pay | Admitting: *Deleted

## 2015-06-24 DIAGNOSIS — G43009 Migraine without aura, not intractable, without status migrainosus: Secondary | ICD-10-CM

## 2015-06-28 MED ORDER — TOPIRAMATE 100 MG PO TABS: 100.0000 mg | ORAL_TABLET | Freq: Two times a day (BID) | ORAL | Status: DC

## 2015-06-28 NOTE — Telephone Encounter (Signed)
Needs CC appt next 30 days. If no CC appt, then [facility] appt. Thanks

## 2015-06-28 NOTE — Telephone Encounter (Signed)
Message sent to front desk pool regarding appt per Dr Butcher. 

## 2015-06-30 ENCOUNTER — Encounter: Payer: Medicare Other | Attending: Registered Nurse | Admitting: Registered Nurse

## 2015-07-21 ENCOUNTER — Encounter: Payer: Medicare Other | Admitting: Internal Medicine

## 2015-08-02 ENCOUNTER — Other Ambulatory Visit: Payer: Self-pay

## 2015-08-02 DIAGNOSIS — E785 Hyperlipidemia, unspecified: Secondary | ICD-10-CM

## 2015-08-02 MED ORDER — ATORVASTATIN CALCIUM 20 MG PO TABS: 20.0000 mg | ORAL_TABLET | Freq: Every day | ORAL | Status: DC

## 2015-08-24 ENCOUNTER — Other Ambulatory Visit: Payer: Self-pay

## 2015-08-24 DIAGNOSIS — B029 Zoster without complications: Principal | ICD-10-CM

## 2015-08-24 DIAGNOSIS — B019 Varicella without complication: Secondary | ICD-10-CM

## 2015-08-24 MED ORDER — GABAPENTIN 300 MG PO CAPS: 300.0000 mg | ORAL_CAPSULE | Freq: Three times a day (TID) | ORAL | Status: DC

## 2015-10-21 ENCOUNTER — Encounter: Payer: Self-pay | Admitting: Pharmacist

## 2015-10-21 NOTE — Progress Notes (Signed)
Patient ID: [name], male   DOB: 1946/03/26, 70 y.o.   MRN: [id number]  Reviewed patient case for statin adherence [facility] referral. [place] who noted atorvastatin prescription on hold and stated they will refill (due to be filled). Tried contacting patient but unable to reach using the phone number on file.

## 2015-10-26 ENCOUNTER — Encounter: Payer: Self-pay | Admitting: Internal Medicine

## 2016-01-12 ENCOUNTER — Encounter: Payer: Self-pay | Admitting: Internal Medicine

## 2016-01-26 ENCOUNTER — Ambulatory Visit (INDEPENDENT_AMBULATORY_CARE_PROVIDER_SITE_OTHER): Payer: Medicare Other | Admitting: Internal Medicine

## 2016-01-26 ENCOUNTER — Encounter: Payer: Self-pay | Admitting: Internal Medicine

## 2016-01-26 VITALS — BP 132/82 | HR 78 | Temp 97.9°F | Ht 67.5 in | Wt 166.9 lb

## 2016-01-26 DIAGNOSIS — G44229 Chronic tension-type headache, not intractable: Secondary | ICD-10-CM | POA: Diagnosis not present

## 2016-01-26 DIAGNOSIS — Z87448 Personal history of other diseases of urinary system: Secondary | ICD-10-CM

## 2016-01-26 DIAGNOSIS — R3129 Other microscopic hematuria: Secondary | ICD-10-CM | POA: Diagnosis not present

## 2016-01-26 DIAGNOSIS — G8929 Other chronic pain: Secondary | ICD-10-CM

## 2016-01-26 DIAGNOSIS — I10 Essential (primary) hypertension: Secondary | ICD-10-CM | POA: Diagnosis not present

## 2016-01-26 DIAGNOSIS — M549 Dorsalgia, unspecified: Secondary | ICD-10-CM

## 2016-01-26 DIAGNOSIS — J449 Chronic obstructive pulmonary disease, unspecified: Secondary | ICD-10-CM | POA: Diagnosis not present

## 2016-01-26 DIAGNOSIS — E785 Hyperlipidemia, unspecified: Secondary | ICD-10-CM

## 2016-01-26 DIAGNOSIS — Z8669 Personal history of other diseases of the nervous system and sense organs: Secondary | ICD-10-CM

## 2016-01-26 DIAGNOSIS — J439 Emphysema, unspecified: Secondary | ICD-10-CM

## 2016-01-26 DIAGNOSIS — G4733 Obstructive sleep apnea (adult) (pediatric): Secondary | ICD-10-CM

## 2016-01-26 DIAGNOSIS — F17200 Nicotine dependence, unspecified, uncomplicated: Secondary | ICD-10-CM

## 2016-01-26 DIAGNOSIS — M545 Low back pain: Secondary | ICD-10-CM

## 2016-01-26 DIAGNOSIS — G479 Sleep disorder, unspecified: Secondary | ICD-10-CM

## 2016-01-26 DIAGNOSIS — Z720 Tobacco use: Secondary | ICD-10-CM

## 2016-01-26 LAB — POCT URINALYSIS DIPSTICK
Bilirubin, UA: NEGATIVE
Glucose, UA: NEGATIVE
Ketones, UA: NEGATIVE
Leukocytes, UA: NEGATIVE
Nitrite, UA: NEGATIVE
Protein, UA: NEGATIVE
Spec Grav, UA: <=1.005
Urobilinogen, UA: 0.2
pH, UA: 6

## 2016-01-26 MED ORDER — ATORVASTATIN CALCIUM 20 MG PO TABS: 20.0000 mg | ORAL_TABLET | Freq: Every day | ORAL | Status: DC

## 2016-01-26 MED ORDER — ALBUTEROL SULFATE HFA 108 (90 BASE) MCG/ACT IN AERS: 2.0000 | INHALATION_SPRAY | Freq: Four times a day (QID) | RESPIRATORY_TRACT | Status: DC

## 2016-01-26 MED ORDER — MELOXICAM 7.5 MG PO TABS: 7.5000 mg | ORAL_TABLET | Freq: Every day | ORAL | Status: DC

## 2016-01-26 NOTE — Progress Notes (Signed)
Medicine attending: Medical history, presenting problems, physical findings, and medications, reviewed with resident physician Dr William Kennedy on the day of the patient visit and I concur with his evaluation and management plan. 

## 2016-01-27 NOTE — Assessment & Plan Note (Signed)
He says his chronic tension headaches, attributed to environmental and occupational exposures, are relieved with Goody's powder, which he takes roughly once per day. He has been on many different prophylactic medications for his headaches and followed in multiple headache clinics, but surprisingly he says the only thing that seems to help his headaches are the Goody's powder. He does understand the increased risk of gastric ulcers with his regular use, and I reiterated that today, but currently he denies any abdominal pain, reflux or dyspepsia, or any overt signs or symptoms of GI bleeding, including melena or hematochezia. -May continue Goody's powder -Instructed patient to stop use if he develops any of the above symptoms

## 2016-01-27 NOTE — Assessment & Plan Note (Signed)
Patient says that his COPD symptoms, although always mild in the past, are continuing to improve as he is slowly cutting down on his cigarette consumption. He is down to about a 1/3 pack a day currently. He is interested in quitting, but declines any pharmacologic assistance or any other resources that I offered him today. He is motivated to do this himself. He currently uses albuterol inhaler once per week approximately. -Refilled his inhaler today.

## 2016-01-27 NOTE — Assessment & Plan Note (Addendum)
See plan for COPD for further details about counseling on smoking cessation. On further questioning, although previously documented as a roughly 55 year pack history, he admits to smoking 2 packs per day for roughly 40 years before cutting down to anywhere between one half a pack to 1 pack per day since then, approximately 90 pack years. -Consider referral for low-dose screening CTA next visit. Patient declined at this time, but does meet criteria. Reassess at next visit

## 2016-01-27 NOTE — Assessment & Plan Note (Signed)
BP Readings from Last 3 Encounters:  01/26/16 132/82  12/28/14 97/56  12/27/14 111/65    Lab Results  Component Value Date   NA 137 12/10/2014   K 3.6 12/10/2014   CREATININE 1.19 12/10/2014    Assessment: Blood pressure control:  controlled Progress toward BP goal:   at goal Comments: Previously on verapamil 120 mg daily, but has not been taking any blood pressure medications for the past 4 months.  Plan: Medications:  Do not restart verapamil. Will continue to monitor clinically but no medications indicated at this time.

## 2016-01-27 NOTE — Assessment & Plan Note (Signed)
Patient with chronic low back pain as previously described. Has previously received Toradol injections during clinic visits for acute exacerbations. He has also been on tramadol and opiates in the past. However, he has been on no pain medications for his back for the past 3-4 months. He does not endorse any worsening of his symptoms at this time, but does have intermittent flares on occasion and is asking for something as needed if and when those do occur. -We'll prescribe meloxicam 7.5 mg daily when necessary for chronic low back pain flares

## 2016-01-27 NOTE — Assessment & Plan Note (Signed)
Has a history of microscopic hematuria on urinalysis. Was last seen one year ago with plans to recheck his urine in 1 month, but patient did not make it to follow-up appointment. I rechecked a urine dip today which still shows microscopic hematuria. Patient still denies any urinary symptoms and denies seeing any blood in his urine. While this was not explained to him in the past unfortunately, I did explain to him that the reason we keep checking his urine is because in rare instances of painless, microscopic hematuria, particularly in older patients with long history of tobacco use, a cause may be bladder cancer. He understood my concern and was agreeable to see urology at this time. -Placed referral to urology -We will follow up urology recommendations at next visit.

## 2016-01-27 NOTE — Assessment & Plan Note (Signed)
Patient previously had a split sleep study done just over a year ago, interpreted by Dr. Annamaria Boots. While he did not recommend a CPAP machine at this time, he did recommend changing posture and sleep. However, the patient does inquire about the CPAP as he is interested in this. While he did place a order for the CPAP, I explained to him that this may not be covered due to Dr. Janee Morn interpretation even though he was formally diagnosed with OSA. -Follow-up about status next visit

## 2016-01-27 NOTE — Assessment & Plan Note (Signed)
Patient was previously prescribed temazepam as well as trazodone. However, he has not been taking any medications for the past 3-4 months, and does not endorse worsening of his sleep symptoms. His OSA may be contributing to his sleep quality and duration. -Reordered CPAP  -Recommended use of melatonin approximately 1 hour before bed -Counseled patient on continued good sleep hygiene techniques

## 2016-01-27 NOTE — Progress Notes (Signed)
   Patient ID: ZION KAERCHER male   DOB: 1945-12-07 70 y.o.   MRN: [id number]  Subjective:   HPI: Mr.Zimri JEWLIAN LECONTE is a 70 y.o. current 90 pack year smoker with PMH Of mild COPD, HTN, chronic LBP, tension headache disorder who presents to [facility] today for annual well checkup and medication reconciliation.   Since he was last seen just over a year ago, he has ran out of all of his medications except for his when necessary albuterol inhaler and has not been taking any indications for the past 3-4 months. Currently, he has no complaints at this time and feels well   Please see problem-based charting for status of medical issues pertinent to this visit.  Review of Systems: Pertinent items noted in HPI and remainder of comprehensive ROS otherwise negative.  Objective:  Physical Exam: Filed Vitals:   01/26/16 1037  BP: 132/82  Pulse: 78  Temp: 97.9 F (36.6 C)  TempSrc: Oral  Height: 5' 7.5" (1.715 m)  Weight: 166 lb 14.4 oz (75.705 kg)  SpO2: 99%   Gen: Well-appearing, alert and oriented to person, place, and time HEENT: Oropharynx clear without erythema or exudate.  Neck: No cervical LAD, no thyromegaly or nodules, no JVD noted. CV: Normal rate, regular rhythm, no murmurs, rubs, or gallops Pulmonary: Normal effort, CTA bilaterally, no wheezing, rales, or rhonchi Abdominal: Soft, non-tender, non-distended, without rebound, guarding, or masses Extremities: Distal pulses 2+ in upper and lower extremities bilaterally, no tenderness, erythema or edema Skin: No atypical appearing moles. No rashes  Assessment & Plan:  Please see problem-based charting for assessment and plan.  Blane Ohara, MD Resident Physician, PGY-1 Department of Internal Medicine [facility]

## 2016-03-07 DIAGNOSIS — R351 Nocturia: Secondary | ICD-10-CM | POA: Diagnosis not present

## 2016-03-07 DIAGNOSIS — Z0000 Encounter for general adult medical examination without abnormal findings: Secondary | ICD-10-CM | POA: Diagnosis not present

## 2016-03-07 DIAGNOSIS — R3129 Other microscopic hematuria: Secondary | ICD-10-CM | POA: Diagnosis not present

## 2016-03-23 DIAGNOSIS — R310 Gross hematuria: Secondary | ICD-10-CM | POA: Diagnosis not present

## 2016-03-23 DIAGNOSIS — N200 Calculus of kidney: Secondary | ICD-10-CM | POA: Diagnosis not present

## 2016-03-27 ENCOUNTER — Encounter: Payer: Self-pay | Admitting: *Deleted

## 2016-03-27 DIAGNOSIS — R3121 Asymptomatic microscopic hematuria: Secondary | ICD-10-CM | POA: Diagnosis not present

## 2016-04-19 ENCOUNTER — Encounter (HOSPITAL_COMMUNITY): Payer: Self-pay | Admitting: Emergency Medicine

## 2016-04-19 ENCOUNTER — Emergency Department (HOSPITAL_COMMUNITY)
Admission: EM | Admit: 2016-04-19 | Discharge: 2016-04-20 | Discharge status: Against Medical Advice | Payer: Medicare Other | Source: Home / Self Care | Attending: Emergency Medicine | Admitting: Emergency Medicine

## 2016-04-19 DIAGNOSIS — Y9389 Activity, other specified: Secondary | ICD-10-CM | POA: Diagnosis not present

## 2016-04-19 DIAGNOSIS — Y929 Unspecified place or not applicable: Secondary | ICD-10-CM | POA: Insufficient documentation

## 2016-04-19 DIAGNOSIS — I10 Essential (primary) hypertension: Secondary | ICD-10-CM | POA: Insufficient documentation

## 2016-04-19 DIAGNOSIS — S199XXA Unspecified injury of neck, initial encounter: Secondary | ICD-10-CM | POA: Diagnosis not present

## 2016-04-19 DIAGNOSIS — Z79899 Other long term (current) drug therapy: Secondary | ICD-10-CM | POA: Insufficient documentation

## 2016-04-19 DIAGNOSIS — Z8719 Personal history of other diseases of the digestive system: Secondary | ICD-10-CM | POA: Diagnosis not present

## 2016-04-19 DIAGNOSIS — E785 Hyperlipidemia, unspecified: Secondary | ICD-10-CM | POA: Insufficient documentation

## 2016-04-19 DIAGNOSIS — F418 Other specified anxiety disorders: Secondary | ICD-10-CM | POA: Insufficient documentation

## 2016-04-19 DIAGNOSIS — F17210 Nicotine dependence, cigarettes, uncomplicated: Secondary | ICD-10-CM | POA: Insufficient documentation

## 2016-04-19 DIAGNOSIS — S0181XA Laceration without foreign body of other part of head, initial encounter: Secondary | ICD-10-CM

## 2016-04-19 DIAGNOSIS — S0181XD Laceration without foreign body of other part of head, subsequent encounter: Secondary | ICD-10-CM | POA: Diagnosis not present

## 2016-04-19 DIAGNOSIS — Y999 Unspecified external cause status: Secondary | ICD-10-CM

## 2016-04-19 DIAGNOSIS — S0081XA Abrasion of other part of head, initial encounter: Secondary | ICD-10-CM | POA: Diagnosis not present

## 2016-04-19 DIAGNOSIS — S50311A Abrasion of right elbow, initial encounter: Secondary | ICD-10-CM | POA: Diagnosis not present

## 2016-04-19 DIAGNOSIS — T07XXXA Unspecified multiple injuries, initial encounter: Secondary | ICD-10-CM

## 2016-04-19 DIAGNOSIS — Z7951 Long term (current) use of inhaled steroids: Secondary | ICD-10-CM | POA: Insufficient documentation

## 2016-04-19 DIAGNOSIS — S60512A Abrasion of left hand, initial encounter: Secondary | ICD-10-CM | POA: Diagnosis not present

## 2016-04-19 DIAGNOSIS — J449 Chronic obstructive pulmonary disease, unspecified: Secondary | ICD-10-CM

## 2016-04-19 DIAGNOSIS — Y939 Activity, unspecified: Secondary | ICD-10-CM

## 2016-04-19 DIAGNOSIS — S0182XA Laceration with foreign body of other part of head, initial encounter: Secondary | ICD-10-CM

## 2016-04-19 DIAGNOSIS — M542 Cervicalgia: Secondary | ICD-10-CM | POA: Diagnosis not present

## 2016-04-19 DIAGNOSIS — T148 Other injury of unspecified body region: Secondary | ICD-10-CM | POA: Diagnosis not present

## 2016-04-19 DIAGNOSIS — S0091XA Abrasion of unspecified part of head, initial encounter: Secondary | ICD-10-CM | POA: Diagnosis not present

## 2016-04-19 DIAGNOSIS — S0993XA Unspecified injury of face, initial encounter: Secondary | ICD-10-CM | POA: Diagnosis not present
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
## 2016-04-20 ENCOUNTER — Emergency Department (HOSPITAL_COMMUNITY): Payer: Medicare Other

## 2016-04-20 ENCOUNTER — Emergency Department (HOSPITAL_COMMUNITY)
Admission: EM | Admit: 2016-04-20 | Discharge: 2016-04-20 | Disposition: A | Discharge status: Home / Self Care | Payer: Medicare Other | Attending: Emergency Medicine | Admitting: Emergency Medicine

## 2016-04-20 ENCOUNTER — Encounter (HOSPITAL_COMMUNITY): Payer: Self-pay

## 2016-04-20 DIAGNOSIS — S0181XD Laceration without foreign body of other part of head, subsequent encounter: Secondary | ICD-10-CM | POA: Diagnosis not present

## 2016-04-20 DIAGNOSIS — S50311A Abrasion of right elbow, initial encounter: Secondary | ICD-10-CM | POA: Diagnosis not present

## 2016-04-20 DIAGNOSIS — S0181XA Laceration without foreign body of other part of head, initial encounter: Secondary | ICD-10-CM | POA: Diagnosis not present

## 2016-04-20 DIAGNOSIS — S199XXA Unspecified injury of neck, initial encounter: Secondary | ICD-10-CM | POA: Diagnosis not present

## 2016-04-20 DIAGNOSIS — M542 Cervicalgia: Secondary | ICD-10-CM | POA: Diagnosis not present

## 2016-04-20 DIAGNOSIS — S60512A Abrasion of left hand, initial encounter: Secondary | ICD-10-CM | POA: Diagnosis not present

## 2016-04-20 MED ORDER — LIDOCAINE-EPINEPHRINE 2 %-1:100000 IJ SOLN
10.0000 mL | Freq: Once | INTRAMUSCULAR | Status: AC
  Administered 2016-04-20: 10 mL
  Filled 2016-04-20: qty 1

## 2016-04-20 MED ORDER — BACITRACIN ZINC 500 UNIT/GM EX OINT
TOPICAL_OINTMENT | Freq: Two times a day (BID) | CUTANEOUS | Status: DC
  Administered 2016-04-20: 1 via TOPICAL

## 2016-04-20 MED ORDER — CEPHALEXIN 500 MG PO CAPS: 500.0000 mg | ORAL_CAPSULE | Freq: Four times a day (QID) | ORAL | Status: DC

## 2016-04-20 MED ORDER — LIDOCAINE-EPINEPHRINE 2 %-1:100000 IJ SOLN: 20.0000 mL | Freq: Once | INTRAMUSCULAR | Status: DC

## 2016-04-20 MED ORDER — TETANUS-DIPHTH-ACELL PERTUSSIS 5-2.5-18.5 LF-MCG/0.5 IM SUSP
0.5000 mL | Freq: Once | INTRAMUSCULAR | Status: AC
  Administered 2016-04-20: 0.5 mL via INTRAMUSCULAR
  Filled 2016-04-20: qty 0.5
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
## 2016-04-20 NOTE — Discharge Instructions (Signed)
You have been seen today for a facial laceration. Your imaging from earlier this morning showed no fractures.   Remove the bandage after 24 hours. You must wait at least 8 hours after the wound repair to wash the wound. Clean the wound and surrounding area gently with tap water and mild soap. Rinse well and blot dry. Do not scrub the wound, as this may cause the wound edges to come apart. You may shower, but avoid submerging the wound, such as with a bath or swimming. Refer to the attached sheets for further wound care.  Clean the wound daily to prevent infection. Reapplication of a topical antibiotic ointment, such as Neosporin, will decrease scab formation and reduce any scarring.  Return in 5 days for suture removal.  Return to the ED sooner should the wound edges come apart or signs of infection arise, such as spreading redness, puffiness/swelling, pus draining from the wound, severe increase in pain, or any other major issues.  Please take all of your antibiotics until finished!   You may develop abdominal discomfort or diarrhea from the antibiotic.  You may help offset this with probiotics which you can buy or get in yogurt. Do not eat or take the probiotics until 2 hours after your antibiotic.
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
## 2016-04-26 ENCOUNTER — Encounter (HOSPITAL_COMMUNITY): Payer: Self-pay | Admitting: *Deleted

## 2016-04-26 ENCOUNTER — Emergency Department (HOSPITAL_COMMUNITY)
Admission: EM | Admit: 2016-04-26 | Discharge: 2016-04-26 | Disposition: A | Discharge status: Home / Self Care | Payer: Medicare Other | Attending: Emergency Medicine | Admitting: Emergency Medicine

## 2016-04-26 DIAGNOSIS — E785 Hyperlipidemia, unspecified: Secondary | ICD-10-CM | POA: Insufficient documentation

## 2016-04-26 DIAGNOSIS — Z4802 Encounter for removal of sutures: Secondary | ICD-10-CM | POA: Diagnosis not present

## 2016-04-26 DIAGNOSIS — Z7951 Long term (current) use of inhaled steroids: Secondary | ICD-10-CM | POA: Insufficient documentation

## 2016-04-26 DIAGNOSIS — F17210 Nicotine dependence, cigarettes, uncomplicated: Secondary | ICD-10-CM | POA: Insufficient documentation

## 2016-04-26 DIAGNOSIS — J449 Chronic obstructive pulmonary disease, unspecified: Secondary | ICD-10-CM | POA: Insufficient documentation

## 2016-04-26 DIAGNOSIS — I10 Essential (primary) hypertension: Secondary | ICD-10-CM | POA: Insufficient documentation

## 2016-04-26 DIAGNOSIS — F329 Major depressive disorder, single episode, unspecified: Secondary | ICD-10-CM | POA: Insufficient documentation

## 2016-04-26 NOTE — ED Notes (Signed)
Sutures removed. In triage.

## 2016-04-26 NOTE — ED Notes (Signed)
Pt is here to have sutures removed.

## 2016-04-26 NOTE — ED Notes (Signed)
Jen AD at bedside to remove sutures.

## 2016-04-26 NOTE — Discharge Instructions (Signed)
Suture Removal, Care After Refer to this sheet in the next few weeks. These instructions provide you with information on caring for yourself after your procedure. Your health care provider may also give you more specific instructions. Your treatment has been planned according to current medical practices, but problems sometimes occur. Call your health care provider if you have any problems or questions after your procedure. WHAT TO EXPECT AFTER THE PROCEDURE After your stitches (sutures) are removed, it is typical to have the following:  Some discomfort and swelling in the wound area.  Slight redness in the area. HOME CARE INSTRUCTIONS   If you have skin adhesive strips over the wound area, do not take the strips off. They will fall off on their own in a few days. If the strips remain in place after 14 days, you may remove them.  Change any bandages (dressings) at least once a day or as directed by your health care provider. If the bandage sticks, soak it off with warm, soapy water.  Apply cream or ointment only as directed by your health care provider. If using cream or ointment, wash the area with soap and water 2 times a day to remove all the cream or ointment. Rinse off the soap and pat the area dry with a clean towel.  Keep the wound area dry and clean. If the bandage becomes wet or dirty, or if it develops a bad smell, change it as soon as possible.  Continue to protect the wound from injury.  Use sunscreen when out in the sun. New scars become sunburned easily. SEEK MEDICAL CARE IF:  You have increasing redness, swelling, or pain in the wound.  You see pus coming from the wound.  You have a fever.  You notice a bad smell coming from the wound or dressing.  Your wound breaks open (edges not staying together).   This information is not intended to replace advice given to you by your health care provider. Make sure you discuss any questions you have with your health care  provider.   Continue washing wound with antibacterial soap and water. Apply neosporin as needed. Follow up with your primary care provider as needed. Return to the ED if you experience severe worsening of your symptoms, fevers, chills, redness, swelling or drainage around your wound, jaw pain.

## 2016-04-26 NOTE — ED Provider Notes (Signed)
CSN: [id number]     Arrival date & time 04/26/16  1130 History   First MD Initiated Contact with Patient 04/26/16 1215     Chief Complaint  Patient presents with  . Suture / Staple Removal     (Consider location/radiation/quality/duration/timing/severity/associated sxs/prior Treatment) HPI   [name] is a 70 year old male with a past medical history HTN, HLD, COPD who presents to the ED for suture removal. Patient had 12 sutures placed 5 days ago. He states he's been taking antibiotics and has completed this course. He was also given tetanus update in the ED. He denies any fevers, chills, redness or swelling around his wound.  Past Medical History  Diagnosis Date  . HTN (hypertension)   . Hyperlipemia   . GERD (gastroesophageal reflux disease)   . History of migraine headaches   . Tobacco use   . Personal history of alcoholism ([place])   . COPD (chronic obstructive pulmonary disease) ([place])   . History of fall   . Tubular adenoma 01/29/2008  . Anxiety and depression    Past Surgical History  Procedure Laterality Date  . Anterior fusion cervical spine  2005    two surgeries after two mechanical falls, per Dr. Donald Pore  . Eye surgery  1954    correction for crossed-eyes  . Hemorrhoid surgery  1971  . Colonoscopy w/ biopsies  01/29/2008    Dr. Silvano Rusk   Family History  Problem Relation Age of Onset  . Heart attack Mother   . Heart attack Father   . Stroke Sister   . Heart attack Brother   . Heart attack Sister    Social History  Substance Use Topics  . Smoking status: Current Every Day Smoker -- 0.50 packs/day for 55 years    Types: Cigarettes  . Smokeless tobacco: None  . Alcohol Use: 0.0 oz/week    0 Standard drinks or equivalent per week     Comment: 6 pack a week.    Review of Systems  All other systems reviewed and are negative.     Allergies  Review of patient's allergies indicates no known allergies.  Home Medications   Prior to Admission  medications   Medication Sig Start Date End Date Taking? Authorizing Provider  albuterol (PROVENTIL HFA;VENTOLIN HFA) 108 (90 Base) MCG/ACT inhaler Inhale 2 puffs into the lungs 4 (four) times daily. 01/26/16   Norval Gable, MD  atorvastatin (LIPITOR) 20 MG tablet Take 1 tablet (20 mg total) by mouth daily. 01/26/16   Norval Gable, MD  cephALEXin (KEFLEX) 500 MG capsule Take 1 capsule (500 mg total) by mouth 4 (four) times daily. 04/20/16   Shawn C Joy, PA-C  gabapentin (NEURONTIN) 300 MG capsule Take 300 mg by mouth daily.    Historical Provider, MD  HYDROcodone-acetaminophen (NORCO/VICODIN) 5-325 MG tablet Take 1 tablet by mouth every 6 (six) hours as needed for moderate pain or severe pain.    Historical Provider, MD  meloxicam (MOBIC) 7.5 MG tablet Take 1 tablet (7.5 mg total) by mouth daily. 01/26/16   Norval Gable, MD  Multiple Vitamins-Minerals (CENTRUM SILVER ADULT 50+) TABS Take 1 tablet by mouth daily.    Historical Provider, MD  tiotropium (SPIRIVA) 18 MCG inhalation capsule Place 18 mcg into inhaler and inhale daily.    Historical Provider, MD   BP 147/85 mmHg  Pulse 86  Temp(Src) 98.1 F (36.7 C) (Oral)  Resp 14  SpO2 96% Physical Exam  Constitutional: He is oriented to  person, place, and time. He appears well-developed and well-nourished. No distress.  HENT:  Head: Normocephalic and atraumatic.  Eyes: Conjunctivae are normal. Right eye exhibits no discharge. Left eye exhibits no discharge. No scleral icterus.  Cardiovascular: Normal rate.   Pulmonary/Chest: Effort normal.  Neurological: He is alert and oriented to person, place, and time. Coordination normal.  Skin: Skin is warm and dry. No rash noted. He is not diaphoretic. No erythema. No pallor.  Well healed wound on chin. No redness, swelling or drainage around wound. No mandibular tenderness. No loose teeth.  Psychiatric: He has a normal mood and affect. His behavior is normal.  Nursing note and vitals  reviewed.   ED Course  Procedures (including critical care time) Labs Review Labs Reviewed - No data to display  Imaging Review No results found. I have personally reviewed and evaluated these images and lab results as part of my medical decision-making.   EKG Interpretation None      MDM   Final diagnoses:  Visit for suture removal   Pt to ER for suture removal and wound check as above. Sutures removed by nurse prior to me seeing this pt.  Vitals normal, no signs of infection. Scar minimization & return precautions given at dc.      Dondra Spry [place], PA-C 04/26/16 1304  Sherwood Gambler, MD 04/27/16 [phone]

## 2016-07-20 ENCOUNTER — Ambulatory Visit (INDEPENDENT_AMBULATORY_CARE_PROVIDER_SITE_OTHER): Payer: Medicare Other | Admitting: Internal Medicine

## 2016-07-20 ENCOUNTER — Encounter: Payer: Self-pay | Admitting: Internal Medicine

## 2016-07-20 ENCOUNTER — Ambulatory Visit (HOSPITAL_COMMUNITY)
Admission: RE | Admit: 2016-07-20 | Discharge: 2016-07-20 | Disposition: A | Discharge status: Home / Self Care | Payer: Medicare Other | Source: Ambulatory Visit | Attending: Internal Medicine | Admitting: Internal Medicine

## 2016-07-20 VITALS — BP 123/65 | HR 82 | Temp 97.7°F | Ht 67.5 in | Wt 170.2 lb

## 2016-07-20 DIAGNOSIS — G8929 Other chronic pain: Secondary | ICD-10-CM

## 2016-07-20 DIAGNOSIS — S22080A Wedge compression fracture of T11-T12 vertebra, initial encounter for closed fracture: Secondary | ICD-10-CM | POA: Diagnosis not present

## 2016-07-20 DIAGNOSIS — Z0000 Encounter for general adult medical examination without abnormal findings: Secondary | ICD-10-CM

## 2016-07-20 DIAGNOSIS — F17210 Nicotine dependence, cigarettes, uncomplicated: Secondary | ICD-10-CM

## 2016-07-20 DIAGNOSIS — Z23 Encounter for immunization: Secondary | ICD-10-CM | POA: Diagnosis not present

## 2016-07-20 DIAGNOSIS — M545 Low back pain, unspecified: Secondary | ICD-10-CM

## 2016-07-20 DIAGNOSIS — J449 Chronic obstructive pulmonary disease, unspecified: Secondary | ICD-10-CM

## 2016-07-20 DIAGNOSIS — M4854XA Collapsed vertebra, not elsewhere classified, thoracic region, initial encounter for fracture: Secondary | ICD-10-CM | POA: Insufficient documentation

## 2016-07-20 DIAGNOSIS — R05 Cough: Secondary | ICD-10-CM

## 2016-07-20 DIAGNOSIS — R200 Anesthesia of skin: Secondary | ICD-10-CM | POA: Diagnosis not present

## 2016-07-20 DIAGNOSIS — I10 Essential (primary) hypertension: Secondary | ICD-10-CM

## 2016-07-20 DIAGNOSIS — M7989 Other specified soft tissue disorders: Secondary | ICD-10-CM

## 2016-07-20 DIAGNOSIS — R519 Headache, unspecified: Secondary | ICD-10-CM

## 2016-07-20 DIAGNOSIS — Z981 Arthrodesis status: Secondary | ICD-10-CM | POA: Diagnosis not present

## 2016-07-20 DIAGNOSIS — R51 Headache: Secondary | ICD-10-CM | POA: Diagnosis not present

## 2016-07-20 DIAGNOSIS — R059 Cough, unspecified: Secondary | ICD-10-CM

## 2016-07-20 DIAGNOSIS — Z87448 Personal history of other diseases of urinary system: Secondary | ICD-10-CM | POA: Diagnosis not present

## 2016-07-20 DIAGNOSIS — G479 Sleep disorder, unspecified: Secondary | ICD-10-CM

## 2016-07-20 DIAGNOSIS — R3121 Asymptomatic microscopic hematuria: Secondary | ICD-10-CM

## 2016-07-20 DIAGNOSIS — J439 Emphysema, unspecified: Secondary | ICD-10-CM

## 2016-07-20 DIAGNOSIS — Z8669 Personal history of other diseases of the nervous system and sense organs: Secondary | ICD-10-CM

## 2016-07-20 DIAGNOSIS — Z87311 Personal history of (healed) other pathological fracture: Secondary | ICD-10-CM

## 2016-07-20 DIAGNOSIS — G4733 Obstructive sleep apnea (adult) (pediatric): Secondary | ICD-10-CM

## 2016-07-20 LAB — POCT URINALYSIS DIPSTICK
BILIRUBIN UA: NEGATIVE
Glucose, UA: NEGATIVE
KETONES UA: NEGATIVE
Nitrite, UA: NEGATIVE
PH UA: 6.5
Protein, UA: NEGATIVE
RBC UA: NEGATIVE
Spec Grav, UA: 1.02
Urobilinogen, UA: 1

## 2016-07-20 MED ORDER — ALBUTEROL SULFATE HFA 108 (90 BASE) MCG/ACT IN AERS: 2.0000 | INHALATION_SPRAY | Freq: Four times a day (QID) | RESPIRATORY_TRACT | 2 refills | Status: DC

## 2016-07-20 MED ORDER — TIOTROPIUM BROMIDE MONOHYDRATE 18 MCG IN CAPS: 18.0000 ug | ORAL_CAPSULE | Freq: Every day | RESPIRATORY_TRACT | 2 refills | Status: DC

## 2016-07-20 MED ORDER — GABAPENTIN 300 MG PO CAPS: 300.0000 mg | ORAL_CAPSULE | Freq: Two times a day (BID) | ORAL | 3 refills | Status: DC

## 2016-07-20 MED ORDER — ATORVASTATIN CALCIUM 20 MG PO TABS: 20.0000 mg | ORAL_TABLET | Freq: Every day | ORAL | 3 refills | Status: DC

## 2016-07-20 MED ORDER — ZOSTER VACCINE LIVE 19400 UNT/0.65ML ~~LOC~~ SUSR: 0.6500 mL | Freq: Once | SUBCUTANEOUS | 0 refills | Status: AC

## 2016-07-20 NOTE — Assessment & Plan Note (Signed)
He also states that occasionally he get lower extremity swelling, mostly towards the end of the day. He was given low-dose Lasix in the past to be used as needed basis. He was asking whether he can still use that.  There was no edema of the lower extremity during the exam today.  I told him to raise his legs while sitting, and he can use compression stockings when on his feet for longer time.

## 2016-07-20 NOTE — Assessment & Plan Note (Signed)
BP Readings from Last 3 Encounters:  07/20/16 123/65  04/26/16 147/85  04/20/16 135/92   He is not on any antihypertensive currently. He stopped taking them more than 6 month ago. He remains normotensive.  -Continue to monitor.

## 2016-07-20 NOTE — Progress Notes (Signed)
CC: Chronic headache and back pain.  HPI:  [name] is a 70 y.o. with past medical history as listed below came to the clinic to get his medication refilled. Headache. He complained of this chronic headache going on since 2005, after his cervical spine fusion surgery. He states that he get his headache every day. There are days when he wake up in the morning with the headaches, other days they're in the afternoon. He states that goody powder work very well for him, but he was told last time not to use it too much as that can cause GI bleeding. He has tried Tylenol and meloxicam with no relief. He has been previously seen in headache clinic and states that  did not help his headaches. He also complained of occasional neck pain, and tingling and numbness of his both hands occasionally. He denies any accompanied nausea vomiting or change in vision. Denies any sinus congestion. Back pain. He also complained of lower back pain, occasionally radiating to his both legs. Denies any tingling numbness or localized weakness. He has an history of 2 spinal surgeries because of fracture thoracic spine due to fall on ice in 2006. He has tried physical therapy in the past which she states never helped. He states that he was giving Norco in the past which worked really well for his back pain. He was given meloxicam during the last visit and which he states is not working for him. Sleep disturbance. He also complained of disturbed sleep, he goes to bed around 9-10 PM. But he walks up within 2-3 hours of sleeping, it really become harder for him to go back to sleep. He had a sleep study done last year, which shows mild sleep apnea, but never had a recommendation for CPAP. He states that Ambien work very well for him. He was given melatonin during last visit which he states never worked. Lower extremity edema. He also states that occasionally he get lower extremity swelling, mostly towards the end of the  day. He was given low-dose Lasix in the past to be used as needed basis. He was asking whether he can still use that.  Past Medical History:  Diagnosis Date  . Anxiety and depression   . COPD (chronic obstructive pulmonary disease) ([place])   . GERD (gastroesophageal reflux disease)   . History of fall   . History of migraine headaches   . HTN (hypertension)   . Hyperlipemia   . Personal history of alcoholism ([place])   . Tobacco use   . Tubular adenoma 01/29/2008    Review of Systems:  As per HPI.  Physical Exam:  Vitals:   07/20/16 1408  BP: 123/65  Pulse: 82  Temp: 97.7 F (36.5 C)  TempSrc: Oral  SpO2: 100%  Weight: 170 lb 3.2 oz (77.2 kg)  Height: 5' 7.5" (1.715 m)    General: Vital signs reviewed.  Patient is well-developed and well-nourished, in no acute distress and cooperative with exam.  Head: Normocephalic and atraumatic. Eyes: EOMI, conjunctivae normal, no scleral icterus.  Neck: Supple, trachea midline, normal ROM, no JVD, masses, thyromegaly, or carotid bruit present.  Cardiovascular: RRR, S1 normal, S2 normal, no murmurs, gallops, or rubs. Pulmonary/Chest: Clear to auscultation bilaterally, no wheezes, rales, or rhonchi. Abdominal: Soft, non-tender, non-distended, BS +, no masses, organomegaly, or guarding present.  Musculoskeletal: No joint deformities, erythema, or stiffness, ROM full and nontender. Extremities: No lower extremity edema bilaterally,  pulses symmetric and intact bilaterally. No cyanosis  or clubbing. Neurological: A&O x3, Strength is normal and symmetric bilaterally, cranial nerve II-XII are grossly intact, no focal motor deficit, sensory intact to light touch bilaterally.  Skin: Warm, dry and intact. Few ecchymosis on right hand and arm and left lower leg. Psychiatric: Normal mood and affect. speech and behavior is normal. Cognition and memory are normal.   Assessment & Plan:   See Encounters Tab for problem based charting.  Patient seen with  Dr. Lynnae January.

## 2016-07-20 NOTE — Assessment & Plan Note (Signed)
During her previous sleep study done more than a year ago, shows mild obstructive sleep apnea. He was never recommended for CPAP.

## 2016-07-20 NOTE — Assessment & Plan Note (Signed)
He also complained of disturbed sleep, he goes to bed around 9-10 PM. But he walks up within 2-3 hours of sleeping, it really become harder for him to go back to sleep. He had a sleep study done last year, which shows mild sleep apnea, but never had a recommendation for CPAP. He states that Ambien work very well for him. He was given melatonin during last visit which he states never worked  I counseled pain on his sleep hygiene today. I told him to try using little high strength of melatonin he can go up to 10 mg. If it's still don't work, then we might consider adding trazodone on an as-needed basis. I discussed with him that the risk and benefit associated with that, it might cause cognitive impairment cause a fall during night when he a get up to go use the restroom.

## 2016-07-20 NOTE — Assessment & Plan Note (Signed)
He also complained of lower back pain, occasionally radiating to his both legs. Denies any tingling numbness or localized weakness. He has an history of 2 spinal surgeries because of fracture thoracic spine due to fall on ice in 2006. He has tried physical therapy in the past which she states never helped. He states that he was giving Norco in the past which worked really well for his back pain. He was given meloxicam during the last visit and which he states is not working for him.  His exam was benign or any localized weakness or tenderness. Range of motion was unrestricted.  -X-ray of his thoracic and lumbar spine. -He assess after the x-rays, he might need further evaluation with most probably CT with contrast, as he hasn't history of rod placed in his back, but might not be able to get an MRI.

## 2016-07-20 NOTE — Assessment & Plan Note (Signed)
His COPD is well controlled. He hardly uses his inhalers. He still smokes about 1-1/2 pack per week. He states that he has decreased from 2 pack per day to this one. Still trying to work on quitting.

## 2016-07-20 NOTE — Assessment & Plan Note (Signed)
He complained of this chronic headache going on since 2005, after his cervical spine fusion surgery. He states that he get his headache every day. There are days when he wake up in the morning with the headaches, other days they're in the afternoon. He states that goody powder work very well for him, but he was told last time not to use it too much as that can cause GI bleeding. He has tried Tylenol and meloxicam with no relief. He has been previously seen in headache clinic and states that  did not help his headaches. He also complained of occasional neck pain, and tingling and numbness of his both hands occasionally. He denies any accompanied nausea vomiting or change in vision. Denies any sinus congestion. On further questioning he do admit that he is using 10-12 packs of Goody's powder every day.  He might be getting a rebound headache because of excessive analgesic use. I told him to stop using Goody's powder, or at least decrease the amount if he really needs it. He should use some Tylenol if needed for headache. We will reassess his headache after stopping Goody's powder. He was seen at headache clinic 5 years ago, we will get the records from there to see what had been tried and what was working for him.

## 2016-07-20 NOTE — Assessment & Plan Note (Signed)
On today's dipstick exam, it was negative for any blood. He was seen at [facility] urology, and had a cystoscope done. He states that they told him that there is no problem in his bladder. No record found.

## 2016-07-20 NOTE — Assessment & Plan Note (Signed)
He was given a flu shot today. We'll keep him a prescription for Zostavax.

## 2016-07-20 NOTE — Patient Instructions (Signed)
Thank you for visiting clinic today. We will get records from your previous headache clinic. We will get some x-rays of your neck and back. Please try not to take that much Goody powder, as that can cause a rebound headache. Try using simple Tylenol if needed. Follow-up in about 2 weeks for further evaluation.

## 2016-07-21 LAB — URINALYSIS, COMPLETE
Bilirubin, UA: NEGATIVE
Glucose, UA: NEGATIVE
Ketones, UA: NEGATIVE
Leukocytes, UA: NEGATIVE
NITRITE UA: NEGATIVE
PH UA: 6.5 (ref 5.0–7.5)
Protein, UA: NEGATIVE
RBC, UA: NEGATIVE
Specific Gravity, UA: 1.008 (ref 1.005–1.030)
Urobilinogen, Ur: 1 mg/dL (ref 0.2–1.0)

## 2016-07-21 LAB — MICROSCOPIC EXAMINATION
Casts: NONE SEEN /lpf
EPITHELIAL CELLS (NON RENAL): NONE SEEN /HPF (ref 0–10)

## 2016-07-23 NOTE — Progress Notes (Signed)
Internal Medicine Clinic Attending  I saw and evaluated the patient.  I personally confirmed the key portions of the history and exam documented by Dr. Reesa Chew and I reviewed pertinent patient test results.  The assessment, diagnosis, and plan were formulated together and I agree with the documentation in the resident's note. Mr Rummler was present with his ex wife with whom he lives. He has several pain issues today. Chronic daily HA for which he takes 12 Goody Powders a day. He states he has seen several physicians for his HA, inc HA specialists, without relief. There is a high likelihood that he has medication overuse HA but other chronic daily HA etiologies should be considered. We should not proceed with tx until we get a full hx, review old records, and see what treatments have been tried. He also has neck issues (I reviewed old records and he had severe spastic cervical myelopathy with marked cervical stenosis at the C5-6 level and with retrolisthesis of C4 on C5 on flexion or on extension radiographs. He had anterior cervical decompression and fusion at the C4-5 and C5-  6 levels.) his neck issues may certainly play into his HA. He also has back issues but states he has "learned to live with the back pain." He states he has broken his back twice after falling on ice in his late 23's. He states he has metal in his back. The only hardware seen on plain film is his cervical hardware. We started with plain films which I indep viewed and he has a compression fx of T12 which is old. Going forward, we need to review old records before considering treatment strategies. I would start twith his HA as that seems to be most troubling for him and necessitating his daily goody Pwdr use.

## 2016-08-08 ENCOUNTER — Ambulatory Visit (HOSPITAL_COMMUNITY): Payer: Medicare Other

## 2016-08-10 ENCOUNTER — Ambulatory Visit (HOSPITAL_COMMUNITY)
Admission: RE | Admit: 2016-08-10 | Discharge: 2016-08-10 | Disposition: A | Discharge status: Home / Self Care | Payer: Medicare Other | Source: Ambulatory Visit | Attending: Internal Medicine | Admitting: Internal Medicine

## 2016-08-10 DIAGNOSIS — R05 Cough: Secondary | ICD-10-CM | POA: Diagnosis not present

## 2016-08-10 DIAGNOSIS — R918 Other nonspecific abnormal finding of lung field: Secondary | ICD-10-CM | POA: Insufficient documentation

## 2016-08-10 DIAGNOSIS — Z87891 Personal history of nicotine dependence: Secondary | ICD-10-CM | POA: Insufficient documentation

## 2016-08-10 DIAGNOSIS — R059 Cough, unspecified: Secondary | ICD-10-CM

## 2016-08-10 DIAGNOSIS — Z23 Encounter for immunization: Secondary | ICD-10-CM

## 2016-08-13 ENCOUNTER — Ambulatory Visit (INDEPENDENT_AMBULATORY_CARE_PROVIDER_SITE_OTHER): Payer: Medicare Other | Admitting: Internal Medicine

## 2016-08-13 ENCOUNTER — Ambulatory Visit (HOSPITAL_COMMUNITY)
Admission: RE | Admit: 2016-08-13 | Discharge: 2016-08-13 | Disposition: A | Discharge status: Home / Self Care | Payer: Medicare Other | Source: Ambulatory Visit | Attending: Student in an Organized Health Care Education/Training Program | Admitting: Student in an Organized Health Care Education/Training Program

## 2016-08-13 ENCOUNTER — Encounter: Payer: Self-pay | Admitting: Internal Medicine

## 2016-08-13 ENCOUNTER — Ambulatory Visit (HOSPITAL_COMMUNITY)
Admission: RE | Admit: 2016-08-13 | Discharge: 2016-08-13 | Disposition: A | Discharge status: Home / Self Care | Payer: Medicare Other | Source: Ambulatory Visit | Attending: Internal Medicine | Admitting: Internal Medicine

## 2016-08-13 VITALS — BP 129/69 | HR 78 | Temp 98.0°F | Wt 172.1 lb

## 2016-08-13 DIAGNOSIS — K829 Disease of gallbladder, unspecified: Secondary | ICD-10-CM | POA: Diagnosis not present

## 2016-08-13 DIAGNOSIS — M7989 Other specified soft tissue disorders: Secondary | ICD-10-CM | POA: Diagnosis not present

## 2016-08-13 DIAGNOSIS — I2089 Other forms of angina pectoris: Secondary | ICD-10-CM | POA: Insufficient documentation

## 2016-08-13 DIAGNOSIS — G43909 Migraine, unspecified, not intractable, without status migrainosus: Secondary | ICD-10-CM

## 2016-08-13 DIAGNOSIS — K828 Other specified diseases of gallbladder: Secondary | ICD-10-CM | POA: Insufficient documentation

## 2016-08-13 DIAGNOSIS — R252 Cramp and spasm: Secondary | ICD-10-CM | POA: Insufficient documentation

## 2016-08-13 DIAGNOSIS — Z8669 Personal history of other diseases of the nervous system and sense organs: Secondary | ICD-10-CM

## 2016-08-13 DIAGNOSIS — R0789 Other chest pain: Secondary | ICD-10-CM

## 2016-08-13 DIAGNOSIS — F1021 Alcohol dependence, in remission: Secondary | ICD-10-CM

## 2016-08-13 DIAGNOSIS — F17210 Nicotine dependence, cigarettes, uncomplicated: Secondary | ICD-10-CM

## 2016-08-13 DIAGNOSIS — I208 Other forms of angina pectoris: Secondary | ICD-10-CM | POA: Insufficient documentation

## 2016-08-13 DIAGNOSIS — R932 Abnormal findings on diagnostic imaging of liver and biliary tract: Secondary | ICD-10-CM | POA: Diagnosis not present

## 2016-08-13 NOTE — Assessment & Plan Note (Signed)
I discussed the results of his CT scan showing evidence for possible cirrhosis. We discussed that this is not the best test to evaluate for cirrhosis and that an abdominal US would give [place] a better picture. Will obtain RUQ abd [place]. His LFTs were normal in March 2016, but will recheck these today. I suspect if he does have cirrhosis that it is likely due to EtOH or fatty liver disease. If [place] does show evidence of cirrhosis I would recommend checking for hepatitis as well.

## 2016-08-13 NOTE — Patient Instructions (Signed)
General Instructions: - Migraines- please gradually decrease your Goody powder and Excedrin use. These medications are causing your headaches. Decrease Goody powder to 4 per day this week and then decrease by 1 each week. For Excedrin, start with 5 per day and then decrease by 1 each week. Goal is to COMPLETELY STOP these medications - Your spine x-rays show no acute findings - A CT chest will need to be repeated in 1 year - I highly recommend that you quit smoking - We will get an ultrasound of your liver to evaluate for cirrhosis as the CT scan showed some findings concerning for cirrhosis. - Blood work today to check liver enzymes and electrolytes - If you have active chest pain again you should immediately go to the emergency room  Thank you for bringing your medicines today. This helps [place] keep you safe from mistakes.   Progress Toward Treatment Goals:  Treatment Goal 12/27/2014  Blood pressure at goal  Stop smoking smoking the same amount    Self Care Goals & Plans:  Self Care Goal 12/27/2014  Manage my medications bring my medications to every visit; take my medicines as prescribed; refill my medications on time  Eat healthy foods -  Be physically active -  Stop smoking go to the [employer] ([email or web address]  Meeting treatment goals maintain the current self-care plan    No flowsheet data found.   Care Management & Community Referrals:  Referral 12/27/2014  Referrals made for care management support smoking cessation counselor

## 2016-08-13 NOTE — Assessment & Plan Note (Signed)
His leg swelling does not seem to position dependent per the patient. It's quite possible given the new liver CT findings of possible cirrhosis that he could be retaining fluid secondary to that. Another possibility is heart failure given his worsening exertional chest pain and SOB symptoms. He had an echo done in 2008 which showed normal EF of 55-65%, no wall motion abnormalities. At this point I think awaiting the abd US findings will be helpful and if that does not seem the likely cause then further evaluation may be warranted. He does deserve a trial of compression stockings to see if this will help.

## 2016-08-13 NOTE — Assessment & Plan Note (Signed)
I agree that his headaches are most likely medication overuse headaches. We discussed extensively that he needs to gradually stop the goody powders and OTC medications including Excedrin. He has agreed to gradually titrate down on these medications by starting with 4 goody powders per day for this week and then decrease by 1 each week until stopped completely. For the Excedrin he agreed to start with 5 daily and then decrease by 1 each week until done completely. I explained he will likely have extreme migraines while going through this elimination process and to resort to laying down in a dark room and wrapping a towel around his head which has worked for him. He understands and agrees with the plan. Will have him follow up in 1 month to assess how his migraines are doing.

## 2016-08-13 NOTE — Assessment & Plan Note (Signed)
His description of chest pain is concerning for stable angina. EKG was performed in clinic and showed normal sinus rhythm, no evidence of Q waves. I think given his chest pain episodes are becoming more frequent he should undergo stress testing to evaluate for ischemic disease. I will order this today. At next visit, can discuss starting aspirin, beta blocker, ACE, and statin with him. I advised him that if he has reoccurrence of his pain (whether with exertion or rest) that he needs to go to the emergency room to be evaluated. He is agreeable to this plan.

## 2016-08-13 NOTE — Progress Notes (Signed)
   CC: Migraines   HPI:  [name] is a 70 y.o. man with PMHx as noted below who presents today for follow up of his migraines.  Migraines: Describes his headaches as frontal, radiating to the back of his head and into his neck, and associated with light and sound sensitivity. He states sitting in a dark room and wrapping a towel tightly around his head helps lessen the headache. He admits he is still taking 6-8 Goody powders a day in combination with extra strength Excedrin up to 10 pills per day along with other OTC medications.   Liver Findings on CT:  He had a CT chest done to evaluate for lung cancer given his smoking history. We reviewed the chest CT results today. I informed him that incidentally evidence of cirrhosis of his liver was seen. He has agreed to proceed with abdominal US for further evaluation. He reports he used to drink alcohol heavily in his late teens to 30s, about 1 case of beer a day or a pint of liquor daily. He quit drinking about 20 years ago. Denies IV drug use.   Leg Swelling: Reports his feet and legs are swollen all the time. He states he used to be on Lasix years ago which helped the swelling. He feels his swelling does not improve when he raises his legs. He does not use compression stockings. He denies any tenderness or erythema in his legs.   Leg Cramps: Reports getting leg cramps frequently but most often at night time. He states the episodes last 15-20 minutes and then resolve on their own. He can be laying down or walking when the symptoms occur. He feels walking makes his symptoms worse. He denies any specific distance before the cramps occur. He states he "can walk down the hall or 1.5 miles" and the symptoms will occur.   Chest pain: Reports over the last 2 years having episodes of chest pain every 2-3 months, but this has increased to 2 times per month recently. His most recent episode was last week while mowing the lawn. He describes his pain as a  cramp, tight, and pressure-like. Pain lasts for about 30 minutes and then resolves with rest. He reports associated SOB, but denies nausea and diaphoresis. He denies any chest pain while at rest.   Past Medical History:  Diagnosis Date  . Anxiety and depression   . COPD (chronic obstructive pulmonary disease) ([place])   . GERD (gastroesophageal reflux disease)   . History of fall   . History of migraine headaches   . HTN (hypertension)   . Hyperlipemia   . Personal history of alcoholism ([place])   . Tobacco use   . Tubular adenoma 01/29/2008    Review of Systems:  All negative except per HPI  Physical Exam:  Vitals:   08/13/16 1352  BP: 129/69  Pulse: 78  Temp: 98 F (36.7 C)  TempSrc: Oral  SpO2: 99%  Weight: 172 lb 1.6 oz (78.1 kg)   General: elderly man sitting up, NAD HEENT: [place]/AT, EOMI, sclera anicteric, mucus membranes moist CV: RRR, no m/g/r Pulm: CTA bilaterally, breaths non-labored Abd: BS+, soft, non-tender, no hepatosplenomegaly Ext: warm, 1+ peripheral edema more prominent in left leg, no erythema or tenderness Neuro: alert and oriented x 3  Assessment & Plan:   See Encounters Tab for problem based charting.  Patient discussed with Dr. Dareen Piano

## 2016-08-13 NOTE — Assessment & Plan Note (Signed)
His leg cramps do not sound like restless legs syndrome given the cramps can occur at any time and he does not feel the urge to get up and walk around. Claudication does not seem likely either as the pain can occur at rest or at any distance when he is walking. Will evaluate his electrolytes for now and reevaluate at next visit.

## 2016-08-14 LAB — CMP14 + ANION GAP
ALBUMIN: 4.3 g/dL (ref 3.5–4.8)
ALK PHOS: 122 IU/L — AB (ref 39–117)
ALT: 15 IU/L (ref 0–44)
AST: 18 IU/L (ref 0–40)
Albumin/Globulin Ratio: 1.4 (ref 1.2–2.2)
Anion Gap: 20 mmol/L — ABNORMAL HIGH (ref 10.0–18.0)
BILIRUBIN TOTAL: 0.3 mg/dL (ref 0.0–1.2)
BUN/Creatinine Ratio: 7 — ABNORMAL LOW (ref 10–24)
BUN: 7 mg/dL — ABNORMAL LOW (ref 8–27)
CHLORIDE: 99 mmol/L (ref 96–106)
CO2: 24 mmol/L (ref 18–29)
Calcium: 9.1 mg/dL (ref 8.6–10.2)
Creatinine, Ser: 1.04 mg/dL (ref 0.76–1.27)
GFR calc Af Amer: 84 mL/min/{1.73_m2} (ref >59)
GFR calc non Af Amer: 72 mL/min/{1.73_m2} (ref >59)
GLOBULIN, TOTAL: 3 g/dL (ref 1.5–4.5)
Glucose: 92 mg/dL (ref 65–99)
POTASSIUM: 4.5 mmol/L (ref 3.5–5.2)
SODIUM: 143 mmol/L (ref 134–144)
Total Protein: 7.3 g/dL (ref 6.0–8.5)

## 2016-08-14 NOTE — Progress Notes (Signed)
Internal Medicine Clinic Attending  Case discussed with Dr. Rivet at the time of the visit.  We reviewed the resident's history and exam and pertinent patient test results.  I agree with the assessment, diagnosis, and plan of care documented in the resident's note.  

## 2016-09-18 ENCOUNTER — Telehealth (HOSPITAL_COMMUNITY): Payer: Self-pay | Admitting: *Deleted

## 2016-09-18 NOTE — Telephone Encounter (Signed)
Patient given detailed instructions per Myocardial Perfusion Study Information Sheet for the test on 09/21/16 at [facility]. Patient notified to arrive 15 minutes early and that it is imperative to arrive on time for appointment to keep from having the test rescheduled.  If you need to cancel or reschedule your appointment, please call the office within 24 hours of your appointment. Failure to do so may result in a cancellation of your appointment, and a $50 no show fee. Patient verbalized understanding.Jerime Arif, Ranae Palms

## 2016-09-21 ENCOUNTER — Ambulatory Visit (HOSPITAL_COMMUNITY): Payer: Medicare Other | Attending: Cardiology

## 2016-09-21 DIAGNOSIS — I208 Other forms of angina pectoris: Secondary | ICD-10-CM | POA: Diagnosis not present

## 2016-09-21 DIAGNOSIS — R9439 Abnormal result of other cardiovascular function study: Secondary | ICD-10-CM | POA: Insufficient documentation

## 2016-09-21 LAB — MYOCARDIAL PERFUSION IMAGING
CHL CUP NUCLEAR SDS: 3
CSEPPHR: 92 {beats}/min
LV dias vol: 74 mL (ref 62–150)
LVSYSVOL: 28 mL
RATE: 0.28
Rest HR: 76 {beats}/min
SRS: 0
SSS: 3
TID: 0.87

## 2016-09-21 MED ORDER — TECHNETIUM TC 99M TETROFOSMIN IV KIT
10.9000 | PACK | Freq: Once | INTRAVENOUS | Status: AC | PRN
  Administered 2016-09-21: 10.9 via INTRAVENOUS
  Filled 2016-09-21: qty 11

## 2016-09-21 MED ORDER — REGADENOSON 0.4 MG/5ML IV SOLN
0.4000 mg | Freq: Once | INTRAVENOUS | Status: AC
  Administered 2016-09-21: 0.4 mg via INTRAVENOUS

## 2016-09-21 MED ORDER — TECHNETIUM TC 99M TETROFOSMIN IV KIT
32.9000 | PACK | Freq: Once | INTRAVENOUS | Status: AC | PRN
  Administered 2016-09-21: 32.9 via INTRAVENOUS
  Filled 2016-09-21: qty 33

## 2016-11-15 ENCOUNTER — Other Ambulatory Visit: Payer: Self-pay | Admitting: Internal Medicine

## 2017-01-18 DIAGNOSIS — H2512 Age-related nuclear cataract, left eye: Secondary | ICD-10-CM | POA: Diagnosis not present

## 2017-01-18 DIAGNOSIS — H2513 Age-related nuclear cataract, bilateral: Secondary | ICD-10-CM | POA: Diagnosis not present

## 2017-01-18 DIAGNOSIS — H02839 Dermatochalasis of unspecified eye, unspecified eyelid: Secondary | ICD-10-CM | POA: Diagnosis not present

## 2017-01-18 DIAGNOSIS — H18413 Arcus senilis, bilateral: Secondary | ICD-10-CM | POA: Diagnosis not present

## 2017-01-18 DIAGNOSIS — H53023 Refractive amblyopia, bilateral: Secondary | ICD-10-CM | POA: Diagnosis not present

## 2017-02-08 DIAGNOSIS — H25812 Combined forms of age-related cataract, left eye: Secondary | ICD-10-CM | POA: Diagnosis not present

## 2017-02-08 DIAGNOSIS — H2512 Age-related nuclear cataract, left eye: Secondary | ICD-10-CM | POA: Diagnosis not present

## 2017-02-08 DIAGNOSIS — H2511 Age-related nuclear cataract, right eye: Secondary | ICD-10-CM | POA: Diagnosis not present

## 2017-02-18 ENCOUNTER — Other Ambulatory Visit: Payer: Self-pay | Admitting: Internal Medicine

## 2017-02-18 DIAGNOSIS — E785 Hyperlipidemia, unspecified: Secondary | ICD-10-CM

## 2017-02-20 ENCOUNTER — Other Ambulatory Visit: Payer: Self-pay

## 2017-02-20 DIAGNOSIS — E785 Hyperlipidemia, unspecified: Secondary | ICD-10-CM

## 2017-02-20 DIAGNOSIS — J449 Chronic obstructive pulmonary disease, unspecified: Secondary | ICD-10-CM

## 2017-02-20 MED ORDER — MELOXICAM 7.5 MG PO TABS: 7.5000 mg | ORAL_TABLET | Freq: Every day | ORAL | 3 refills | Status: DC

## 2017-02-20 MED ORDER — ATORVASTATIN CALCIUM 20 MG PO TABS: 20.0000 mg | ORAL_TABLET | Freq: Every day | ORAL | 3 refills | Status: DC

## 2017-02-20 MED ORDER — TIOTROPIUM BROMIDE MONOHYDRATE 18 MCG IN CAPS: 18.0000 ug | ORAL_CAPSULE | Freq: Every day | RESPIRATORY_TRACT | 2 refills | Status: DC

## 2017-02-20 MED ORDER — ALBUTEROL SULFATE HFA 108 (90 BASE) MCG/ACT IN AERS: 2.0000 | INHALATION_SPRAY | Freq: Four times a day (QID) | RESPIRATORY_TRACT | 2 refills | Status: DC

## 2017-02-20 MED ORDER — GABAPENTIN 300 MG PO CAPS: ORAL_CAPSULE | ORAL | 0 refills | Status: DC

## 2017-02-20 NOTE — Telephone Encounter (Signed)
Already ordered

## 2017-02-20 NOTE — Telephone Encounter (Signed)
Requesting all meds to be filled @ [facility] on [address].  

## 2017-02-20 NOTE — Telephone Encounter (Signed)
I am refilling his meds. Please call and make a follow up appointment.

## 2017-02-22 DIAGNOSIS — H25811 Combined forms of age-related cataract, right eye: Secondary | ICD-10-CM | POA: Diagnosis not present

## 2017-02-22 DIAGNOSIS — H2511 Age-related nuclear cataract, right eye: Secondary | ICD-10-CM | POA: Diagnosis not present

## 2017-03-20 ENCOUNTER — Other Ambulatory Visit: Payer: Self-pay | Admitting: Internal Medicine

## 2017-03-21 ENCOUNTER — Other Ambulatory Visit: Payer: Self-pay | Admitting: Internal Medicine

## 2017-03-21 NOTE — Telephone Encounter (Signed)
Refill request gabapentin (NEURONTIN) 300 MG capsule  

## 2017-04-12 ENCOUNTER — Ambulatory Visit (INDEPENDENT_AMBULATORY_CARE_PROVIDER_SITE_OTHER): Payer: Medicare Other | Admitting: Internal Medicine

## 2017-04-12 ENCOUNTER — Encounter: Payer: Self-pay | Admitting: Internal Medicine

## 2017-04-12 VITALS — BP 109/63 | HR 69 | Temp 98.4°F | Wt 169.4 lb

## 2017-04-12 DIAGNOSIS — I10 Essential (primary) hypertension: Secondary | ICD-10-CM | POA: Diagnosis not present

## 2017-04-12 DIAGNOSIS — Z8669 Personal history of other diseases of the nervous system and sense organs: Secondary | ICD-10-CM | POA: Diagnosis not present

## 2017-04-12 DIAGNOSIS — I208 Other forms of angina pectoris: Secondary | ICD-10-CM | POA: Diagnosis not present

## 2017-04-12 DIAGNOSIS — M7989 Other specified soft tissue disorders: Secondary | ICD-10-CM | POA: Diagnosis not present

## 2017-04-12 DIAGNOSIS — M65352 Trigger finger, left little finger: Secondary | ICD-10-CM | POA: Diagnosis not present

## 2017-04-12 DIAGNOSIS — Z79899 Other long term (current) drug therapy: Secondary | ICD-10-CM | POA: Diagnosis not present

## 2017-04-12 DIAGNOSIS — E785 Hyperlipidemia, unspecified: Secondary | ICD-10-CM

## 2017-04-12 MED ORDER — ASPIRIN EC 81 MG PO TBEC: 81.0000 mg | DELAYED_RELEASE_TABLET | Freq: Every day | ORAL | 2 refills | Status: DC

## 2017-04-12 MED ORDER — ATORVASTATIN CALCIUM 20 MG PO TABS: 20.0000 mg | ORAL_TABLET | Freq: Every day | ORAL | 3 refills | Status: DC

## 2017-04-12 MED ORDER — GABAPENTIN 300 MG PO CAPS: 300.0000 mg | ORAL_CAPSULE | Freq: Two times a day (BID) | ORAL | 2 refills | Status: DC

## 2017-04-12 NOTE — Assessment & Plan Note (Signed)
BP Readings from Last 3 Encounters:  04/12/17 109/63  08/13/16 129/69  07/20/16 123/65   He remained normotensive, blood pressure was little soft, he denies any dizziness. He is off the verapamil for last 7 months.  -He do not need any antihypertensives at this time.

## 2017-04-12 NOTE — Assessment & Plan Note (Signed)
He denies any chest pain or shortness of breath.  Myoview done in December was low risk and shows a small area of apical defect consistent with some ischemia.  Patient is on Lipitor 20 mg daily. -Add aspirin 81 mg daily.

## 2017-04-12 NOTE — Patient Instructions (Signed)
Thank you for visiting clinic today. We did a steroid injection on your left finger, hopefully you will have little ease of movement around that finger. You might experience some soreness for couple of days. If your swelling or redness get worse please contact our clinic. I'm also giving you a referral for headache clinic. Please use compression stockings when walking around, you can buy them over-the-counter. Please follow-up in 3 month.  Trigger Finger Trigger finger (stenosing tenosynovitis) is a condition that causes a finger to get stuck in a bent position. Each finger has a tough, cord-like tissue that connects muscle to bone (tendon), and each tendon is surrounded by a tunnel of tissue (tendon sheath). To move your finger, your tendon needs to slide freely through the sheath. Trigger finger happens when the tendon or the sheath thickens, making it difficult to move your finger. Trigger finger can affect any finger or a thumb. It may affect more than one finger. Mild cases may clear up with rest and medicine. Severe cases require more treatment. What are the causes? Trigger finger is caused by a thickened finger tendon or tendon sheath. The cause of this thickening is not known. What increases the risk? The following factors may make you more likely to develop this condition:  Doing activities that require a strong grip.  Having rheumatoid arthritis, gout, or diabetes.  Being 9-38 years old.  Being a woman.  What are the signs or symptoms? Symptoms of this condition include:  Pain when bending or straightening your finger.  Tenderness or swelling where your finger attaches to the palm of your hand.  A lump in the palm of your hand or on the inside of your finger.  Hearing a popping sound when you try to straighten your finger.  Feeling a popping, catching, or locking sensation when you try to straighten your finger.  Being unable to straighten your finger.  How is this  diagnosed? This condition is diagnosed based on your symptoms and a physical exam. How is this treated? This condition may be treated by:  Resting your finger and avoiding activities that make symptoms worse.  Wearing a finger splint to keep your finger in a slightly bent position.  Taking NSAIDs to relieve pain and swelling.  Injecting medicine (steroids) into the tendon sheath to reduce swelling and irritation. Injections may need to be repeated.  Having surgery to open the tendon sheath. This may be done if other treatments do not work and you cannot straighten your finger. You may need physical therapy after surgery.  Follow these instructions at home:  Use moist heat to help reduce pain and swelling as told by your health care provider.  Rest your finger and avoid activities that make pain worse. Return to normal activities as told by your health care provider.  If you have a splint, wear it as told by your health care provider.  Take over-the-counter and prescription medicines only as told by your health care provider.  Keep all follow-up visits as told by your health care provider. This is important. Contact a health care provider if:  Your symptoms are not improving with home care. Summary  Trigger finger (stenosing tenosynovitis) causes your finger to get stuck in a bent position, and it can make it difficult and painful to straighten your finger.  This condition develops when a finger tendon or tendon sheath thickens.  Treatment starts with resting, wearing a splint, and taking NSAIDs.  In severe cases, surgery to open the  tendon sheath may be needed. This information is not intended to replace advice given to you by your health care provider. Make sure you discuss any questions you have with your health care provider. Document Released: 07/14/2004 Document Revised: 09/04/2016 Document Reviewed: 09/04/2016 Elsevier Interactive Patient Education  2017 [employer].

## 2017-04-12 NOTE — Assessment & Plan Note (Signed)
He was having trigger little finger of left hand.  Steroid and lidocaine injection was given in digital flexor tendon sheet, patient tolerated the procedure very well and he was able to fully extend his finger after the procedure.  If his condition failed to improve or recur we can try another injection or can discuss sending him for hand surgery.

## 2017-04-12 NOTE — Progress Notes (Signed)
   CC: Lower extremity edema and left little finger contracture.  HPI:  [name] is a 71 y.o. with past medical history as listed below came to the clinic with complaint of lower extremity edema and left little finger contracture.  Lower extremity edema. He was complaining of lower extremity edema more pronounced on left for a few months, his edema get worse as the day goes by improved in the morning and after resting. he denies any exertional dyspnea or chest pain. he denies any recent travel, prolonged immobilization or calf tenderness.  Daily headaches. he continued to get daily headaches and continue to use Goody powder 4-6 times daily. He was seen at a headache clinic 5 years ago-unable to find any record. I explained to him that [place] powder might be causing these headaches but he do not want to give up stating that it is the only thing that helped with his pain. He explained his headache as bandlike, had some photophobia and would like to stay in a darkened room. No aura, denies any associated nausea, vomiting, tingling or numbness or any focal deficit.  Contracture of left fifth finger. He was complaining of contracture of left fifth finger for the past 3 month, he is unable to extend fully his fifth finger and was feeling a knot  at the base, stating that it feels like get stuck. He denies any accompanied pain , tingling or numbness.  Past Medical History:  Diagnosis Date  . Anxiety and depression   . COPD (chronic obstructive pulmonary disease) ([place])   . GERD (gastroesophageal reflux disease)   . History of fall   . History of migraine headaches   . HTN (hypertension)   . Hyperlipemia   . Personal history of alcoholism ([place])   . Tobacco use   . Tubular adenoma 01/29/2008   Review of Systems:  As per HPI.  Physical Exam:  Vitals:   04/12/17 1331  BP: 109/63  Pulse: 69  Temp: 98.4 F (36.9 C)  TempSrc: Oral  SpO2: 100%  Weight: 169 lb 6.4 oz (76.8 kg)     General: Vital signs reviewed.  Patient is well-developed and well-nourished, in no acute distress and cooperative with exam.  Cardiovascular: RRR, S1 normal, S2 normal, no murmurs, gallops, or rubs. Pulmonary/Chest: Clear to auscultation bilaterally, no wheezes, rales, or rhonchi. Abdominal: Soft, non-tender, non-distended, BS +, no masses, organomegaly, or guarding present.  Musculoskeletal: Contracture of left little finger with a palpable nodule at the base of the metacarpal phalangeal joint along with a palpable tendon across the palm. No tenderness erythema or edema. Extremities: 1+ lower extremity edema bilaterally, little more pronounced on the left, no calf tenderness,  pulses symmetric and intact bilaterally.  Neurological: A&O x3, Strength is normal and symmetric bilaterally, cranial nerve II-XII are grossly intact, no focal motor deficit, sensory intact to light touch bilaterally.  Skin: Warm, dry and intact. No rashes or erythema. Psychiatric: Normal mood and affect. speech and behavior is normal. Cognition and memory are normal.  Assessment & Plan:   See Encounters Tab for problem based charting.  Patient discussed with Dr. Dareen Piano.

## 2017-04-12 NOTE — Assessment & Plan Note (Signed)
History headaches might be due to migraines, most likely due to excessive analgesic use.  Patient is not ready to give up his Goody powder.  Referral for neurology was provided to be seen at headache clinic.

## 2017-04-12 NOTE — Assessment & Plan Note (Signed)
His lower extremity edema is more consistent with dependent edema due to venous insufficiency. It was little more pronounced on left as compared to right, no calf tenderness.  -He was advised to use compression stockings and keep his feet elevated when sitting.

## 2017-04-12 NOTE — Progress Notes (Signed)
Digital Flexor Tendon Sheath Injection   Pre-operative diagnosis: Digital Flexor Tenosynovitis (Trigger Finger) Left 5th finger  After risks and benefits were explained including bleeding, infection, tendon damage or rupture, allergic reaction to medications, vascular injection, and nerve damage, signed consent was obtained.  All questions were answered.    The flexor surface of the left 5th finger was cleaned with alcohol swabs. Using a 30 gauge 0.5 inch needle the flexor tendon was entered, confirmed by movement of the finger. The needle was withdrawn until resistance on the plunger decreased. The tendon sheath space was then injected with 10mg , 0.25 mL of Triamcinolone and 0.75 mL of 1% Lidocaine without Epi. The needle was withdrawn, the site was cleaned and covered with a band aid.   The patient did tolerate the procedure well and there were not complications.   Lucious Groves, DO

## 2017-04-15 NOTE — Progress Notes (Signed)
Internal Medicine Clinic Attending  Case discussed with Dr. Amin at the time of the visit.  We reviewed the resident's history and exam and pertinent patient test results.  I agree with the assessment, diagnosis, and plan of care documented in the resident's note.    

## 2017-04-29 ENCOUNTER — Encounter: Payer: Self-pay | Admitting: *Deleted

## 2017-05-29 NOTE — Addendum Note (Signed)
Addended by: Hulan Fray on: 05/29/2017 07:26 PM   Modules accepted: Orders

## 2017-06-25 ENCOUNTER — Emergency Department (HOSPITAL_COMMUNITY)
Admission: EM | Admit: 2017-06-25 | Discharge: 2017-06-25 | Disposition: A | Discharge status: Home / Self Care | Payer: Medicare Other | Attending: Emergency Medicine | Admitting: Emergency Medicine

## 2017-06-25 ENCOUNTER — Encounter (HOSPITAL_COMMUNITY): Payer: Self-pay

## 2017-06-25 ENCOUNTER — Emergency Department (HOSPITAL_COMMUNITY): Payer: Medicare Other

## 2017-06-25 DIAGNOSIS — F17210 Nicotine dependence, cigarettes, uncomplicated: Secondary | ICD-10-CM | POA: Insufficient documentation

## 2017-06-25 DIAGNOSIS — I10 Essential (primary) hypertension: Secondary | ICD-10-CM | POA: Insufficient documentation

## 2017-06-25 DIAGNOSIS — M545 Low back pain, unspecified: Secondary | ICD-10-CM

## 2017-06-25 DIAGNOSIS — J449 Chronic obstructive pulmonary disease, unspecified: Secondary | ICD-10-CM | POA: Insufficient documentation

## 2017-06-25 DIAGNOSIS — Z7982 Long term (current) use of aspirin: Secondary | ICD-10-CM | POA: Diagnosis not present

## 2017-06-25 DIAGNOSIS — Z79899 Other long term (current) drug therapy: Secondary | ICD-10-CM | POA: Insufficient documentation

## 2017-06-25 DIAGNOSIS — R072 Precordial pain: Secondary | ICD-10-CM | POA: Diagnosis not present

## 2017-06-25 DIAGNOSIS — J439 Emphysema, unspecified: Secondary | ICD-10-CM | POA: Diagnosis not present

## 2017-06-25 LAB — BASIC METABOLIC PANEL WITH GFR
Anion gap: 7 (ref 5–15)
BUN: 11 mg/dL (ref 6–20)
CO2: 26 mmol/L (ref 22–32)
Calcium: 9.2 mg/dL (ref 8.9–10.3)
Chloride: 105 mmol/L (ref 101–111)
Creatinine, Ser: 1.05 mg/dL (ref 0.61–1.24)
GFR calc Af Amer: >60 mL/min
GFR calc non Af Amer: >60 mL/min
Glucose, Bld: 90 mg/dL (ref 65–99)
Potassium: 5.2 mmol/L — ABNORMAL HIGH (ref 3.5–5.1)
Sodium: 138 mmol/L (ref 135–145)

## 2017-06-25 LAB — CBC
HCT: 46.9 % (ref 39.0–52.0)
HEMOGLOBIN: 16 g/dL (ref 13.0–17.0)
MCH: 33.1 pg (ref 26.0–34.0)
MCHC: 34.1 g/dL (ref 30.0–36.0)
MCV: 96.9 fL (ref 78.0–100.0)
PLATELETS: 190 10*3/uL (ref 150–400)
RBC: 4.84 MIL/uL (ref 4.22–5.81)
RDW: 13 % (ref 11.5–15.5)
WBC: 8.7 10*3/uL (ref 4.0–10.5)

## 2017-06-25 LAB — I-STAT TROPONIN, ED: TROPONIN I, POC: 0.05 ng/mL (ref 0.00–0.08)

## 2017-06-25 MED ORDER — MORPHINE SULFATE (PF) 4 MG/ML IV SOLN
4.0000 mg | Freq: Once | INTRAVENOUS | Status: AC
  Administered 2017-06-25: 4 mg via INTRAMUSCULAR
  Filled 2017-06-25: qty 1

## 2017-06-25 MED ORDER — ACETAMINOPHEN 325 MG PO TABS
650.0000 mg | ORAL_TABLET | Freq: Once | ORAL | Status: AC
  Administered 2017-06-25: 650 mg via ORAL
  Filled 2017-06-25: qty 2

## 2017-06-25 MED ORDER — DICLOFENAC SODIUM 3 % TD GEL: 1.0000 "application " | Freq: Two times a day (BID) | TRANSDERMAL | 0 refills | Status: DC

## 2017-06-25 MED ORDER — ACETAMINOPHEN 325 MG PO TABS: 650.0000 mg | ORAL_TABLET | Freq: Four times a day (QID) | ORAL | 0 refills | Status: AC | PRN

## 2017-06-25 NOTE — ED Provider Notes (Signed)
[facility] DEPT Provider Note   CSN: [id number] Arrival date & time: 06/25/17  1236     History   Chief Complaint Chief Complaint  Patient presents with  . Back Pain  . Chest Pain    HPI [name] is a 71 y.o. male.  [name] is a 71 y.o. Male who presents to the ED complaining of acute low back pain. Patient reports he developed low back pain 2 days ago that is worse with movement. He denies any known injury. He denies falls. He reports if he holds still he has minimal pain and with movement his pain is severe. Nursing notes reports that he complains of some chest pain. Patient reports that she is had some intermittent left-sided chest pain several weeks ago that lasts seconds. He tells me he's had no chest pain in over 3 weeks. He denies any current chest pain. He tells me this was a confusion in triage. Patient has taken nothing for his pain today. He denies fevers, coughing, chest pain, shortness of breath, numbness, tingling, weakness, loss of bladder control, loss of bowel control, abdominal pain, nausea, vomiting, urinary symptoms, rashes, history of cancer, history of IV drug use or rashes.   The history is provided by the patient and medical records. No language interpreter was used.  Back Pain   Pertinent negatives include no chest pain, no fever, no numbness, no headaches, no abdominal pain, no dysuria and no weakness.  Chest Pain   Associated symptoms include back pain. Pertinent negatives include no abdominal pain, no cough, no fever, no headaches, no nausea, no numbness, no palpitations, no shortness of breath, no vomiting and no weakness.    Past Medical History:  Diagnosis Date  . Anxiety and depression   . COPD (chronic obstructive pulmonary disease) ([place])   . GERD (gastroesophageal reflux disease)   . History of fall   . History of migraine headaches   . HTN (hypertension)   . Hyperlipemia   . Personal history of alcoholism ([place])   .  Tobacco use   . Tubular adenoma 01/29/2008    Patient Active Problem List   Diagnosis Date Noted  . Trigger little finger of left hand 04/12/2017  . Abnormal CT of liver 08/13/2016  . Leg cramps 08/13/2016  . Stable angina ([place]) 08/13/2016  . OSA (obstructive sleep apnea) 08/23/2014  . Hx of migraines 05/26/2014  . Back pain, chronic 05/26/2014  . History of colonic polyps 12/29/2012  . Hyperlipemia 11/12/2012  . COPD (chronic obstructive pulmonary disease) ([place]) 11/12/2012  . Leg swelling 11/12/2012  . HTN (hypertension) 11/12/2012  . Tobacco use 11/12/2012  . Healthcare maintenance 11/12/2012    Past Surgical History:  Procedure Laterality Date  . ANTERIOR FUSION CERVICAL SPINE  2005   two surgeries after two mechanical falls, per Dr. Donald Pore  . COLONOSCOPY W/ BIOPSIES  01/29/2008   Dr. Silvano Rusk  . [place]   correction for crossed-eyes  . [place] Medications    Prior to Admission medications   Medication Sig Start Date End Date Taking? Authorizing Provider  albuterol (PROVENTIL HFA;VENTOLIN HFA) 108 (90 Base) MCG/ACT inhaler Inhale 2 puffs into the lungs 4 (four) times daily. 02/20/17  Yes Lorella Nimrod, MD  aspirin EC 81 MG tablet Take 1 tablet (81 mg total) by mouth daily. 04/12/17 04/12/18 Yes Lorella Nimrod, MD  atorvastatin (LIPITOR) 20 MG tablet Take 1 tablet (20 mg  total) by mouth daily. 04/12/17  Yes Lorella Nimrod, MD  gabapentin (NEURONTIN) 300 MG capsule Take 1 capsule (300 mg total) by mouth 2 (two) times daily. 04/12/17  Yes Lorella Nimrod, MD  meloxicam (MOBIC) 7.5 MG tablet Take 1 tablet (7.5 mg total) by mouth daily. 02/20/17  Yes Lorella Nimrod, MD  Multiple Vitamins-Minerals (CENTRUM SILVER ADULT 50+) TABS Take 1 tablet by mouth daily.   Yes [provider]  tiotropium (SPIRIVA) 18 MCG inhalation capsule Place 1 capsule (18 mcg total) into inhaler and inhale daily. 02/20/17  Yes Lorella Nimrod, MD  acetaminophen  (TYLENOL) 325 MG tablet Take 2 tablets (650 mg total) by mouth every 6 (six) hours as needed for mild pain or moderate pain. 06/25/17   Waynetta Pean, PA-C  cephALEXin (KEFLEX) 500 MG capsule Take 1 capsule (500 mg total) by mouth 4 (four) times daily. Patient not taking: Reported on 06/25/2017 04/20/16   Arlean Hopping C, PA-C  Diclofenac Sodium 3 % GEL Place 1 application onto the skin 2 (two) times daily. To affected area. 06/25/17   Waynetta Pean, PA-C    Family History Family History  Problem Relation Age of Onset  . Heart attack Mother   . Heart attack Father   . Stroke Sister   . Heart attack Brother   . Heart attack Sister     Social History Social History  Substance Use Topics  . Smoking status: Current Every Day Smoker    Packs/day: 0.50    Years: 55.00    Types: Cigarettes  . Smokeless tobacco: Never Used  . Alcohol use 0.0 oz/week     Comment: 6 pack a week.     Allergies   Patient has no known allergies.   Review of Systems Review of Systems  Constitutional: Negative for chills and fever.  HENT: Negative for congestion and sore throat.   Eyes: Negative for visual disturbance.  Respiratory: Negative for cough, shortness of breath and wheezing.   Cardiovascular: Negative for chest pain and palpitations.  Gastrointestinal: Negative for abdominal pain, diarrhea, nausea and vomiting.  Genitourinary: Negative for difficulty urinating, dysuria, frequency, penile pain, testicular pain and urgency.  Musculoskeletal: Positive for back pain. Negative for neck pain.  Skin: Negative for rash.  Neurological: Negative for weakness, light-headedness, numbness and headaches.     Physical Exam Updated Vital Signs BP 137/77   Pulse 70   Temp 98.3 F (36.8 C) (Oral)   Resp 17   Ht 5' 7.5" (1.715 m)   Wt 76.7 kg (169 lb)   SpO2 98%   BMI 26.08 kg/m   Physical Exam  Constitutional: He is oriented to person, place, and time. He appears well-developed and  well-nourished. No distress.  Nontoxic appearing.   HENT:  Head: Normocephalic and atraumatic.  Mouth/Throat: Oropharynx is clear and moist.  Eyes: Pupils are equal, round, and reactive to light. Conjunctivae are normal. Right eye exhibits no discharge. Left eye exhibits no discharge.  Neck: Normal range of motion. Neck supple. No JVD present. No tracheal deviation present.  Cardiovascular: Normal rate, regular rhythm, normal heart sounds and intact distal pulses.  Exam reveals no gallop and no friction rub.   No murmur heard. Bilateral radial, posterior tibialis and dorsalis pedis pulses are intact.    Pulmonary/Chest: Effort normal and breath sounds normal. No stridor. No respiratory distress. He has no wheezes. He has no rales.  Lungs are clear to ascultation bilaterally. Symmetric chest expansion bilaterally. No increased work of breathing. No rales  or rhonchi.    Abdominal: Soft. There is no tenderness. There is no guarding.  Musculoskeletal: Normal range of motion. He exhibits tenderness. He exhibits no edema or deformity.  Patient has midline lumbar spine tenderness to palpation as well as bilateral low back tenderness to palpation. No overlying skin changes. No back erythema, deformity or ecchymosis. No lower extremity edema or tenderness. Good strength in his bilateral lower extremities when sitting in the bed. He reports significant pain when trying to ambulate prior to pain medication. After pain medication he ambulates with normal gait and no foot drop.   Lymphadenopathy:    He has no cervical adenopathy.  Neurological: He is alert and oriented to person, place, and time. He displays normal reflexes. No sensory deficit. He exhibits normal muscle tone. Coordination normal.  Bilateral patellar DTRs are intact. Sensation and strength is intact his bilateral upper and lower extremities. Ambulates with normal gait. No foot drop.   Skin: Skin is warm and dry. Capillary refill takes less than  2 seconds. No rash noted. He is not diaphoretic. No erythema. No pallor.  Psychiatric: He has a normal mood and affect. His behavior is normal.  Nursing note and vitals reviewed.    ED Treatments / Results  Labs (all labs ordered are listed, but only abnormal results are displayed) Labs Reviewed  BASIC METABOLIC PANEL - Abnormal; Notable for the following:       Result Value   Potassium 5.2 (*)    All other components within normal limits  CBC  I-STAT TROPONIN, ED    EKG  EKG Interpretation  Date/Time:  Tuesday June 25 2017 14:12:09 EDT Ventricular Rate:  67 PR Interval:  120 QRS Duration: 86 QT Interval:  396 QTC Calculation: 418 R Axis:   68 Text Interpretation:  Normal sinus rhythm Normal ECG no acute changes  Confirmed by Brantley Stage [id number]) on 06/25/2017 7:32:22 PM       Radiology Dg Chest 2 View  Result Date: 06/25/2017 CLINICAL DATA:  Bilateral lower extremity first without scratch the bilateral lower extremity swelling for 3 days. EXAM: CHEST  2 VIEW COMPARISON:  Low dose chest CT 08/10/2016. PA and lateral chest 03/10/2010. FINDINGS: The lungs are emphysematous. No consolidative process or edema. Mild prominence of the pulmonary interstitium in the lower lobes bilaterally and in the right upper lobe appears unchanged compared to the prior CT. Heart size is normal. T12 compression fracture is identified as seen on prior CT. No acute bony abnormality. IMPRESSION: No acute disease. Emphysema and chronic interstitial change. Remote T12 compression fracture. Electronically Signed   By: Inge Rise M.D.   On: 06/25/2017 14:33   Dg Lumbar Spine Complete  Result Date: 06/25/2017 CLINICAL DATA:  Low back pain.  Twisting injury.  No recent trauma. EXAM: LUMBAR SPINE - COMPLETE 4+ VIEW COMPARISON:  07/20/2016 FINDINGS: Five lumbar type vertebral bodies. Sacroiliac joints are symmetric. T12 vertebral body height loss is chronic and could be partially developmental  (butterfly vertebra). Mild spondylosis with loss of intervertebral disc height at the lumbosacral junction. Facet arthropathy at L4-5 and L5-S1. Aortic atherosclerosis. IMPRESSION: No acute osseous abnormality. Electronically Signed   By: Abigail Miyamoto M.D.   On: 06/25/2017 18:29    Procedures Procedures (including critical care time)  Medications Ordered in ED Medications  acetaminophen (TYLENOL) tablet 650 mg (not administered)  morphine 4 MG/ML injection 4 mg (4 mg Intramuscular Given 06/25/17 1819)     Initial Impression / Assessment and Plan / ED  Course  I have reviewed the triage vital signs and the nursing notes.  Pertinent labs & imaging results that were available during my care of the patient were reviewed by me and considered in my medical decision making (see chart for details).    This is a 71 y.o. Male who presents to the ED complaining of acute low back pain. Patient reports he developed low back pain 2 days ago that is worse with movement. He denies any known injury. He denies falls. He reports if he holds still he has minimal pain and with movement his pain is severe. Nursing notes reports that he complains of some chest pain. Patient reports that she is had some intermittent left-sided chest pain several weeks ago that lasts seconds. He tells me he's had no chest pain in over 3 weeks. He denies any current chest pain. He tells me this was a confusion in triage. Patient has taken nothing for his pain today. On exam the patient is afebrile nontoxic appearing. He is tenderness to the midline of his lumbar spine as well as diffusely across his low back musculature. No crepitus or deformity. No overlying skin changes to his back. No loss of bowel or bladder control. No urinary symptoms. No abdominal pain. Bilateral patellar DTRs are intact. No concern for cauda equina. EKG shows normal sinus rhythm. Troponin is not elevated. Patient denies any chest pain for 3 weeks. We'll have him  follow-up with his primary care doctor. Chest x-ray shows a remote T12 compression fracture and lumbar spine x-ray shows no acute osseous abnormality. At reevaluation following pain medication patient reports he's feeling much better. He ambulates with normal gait. No foot drop. We'll discharge at this time with follow-up by primary care. Prescriptions at discharge include Tylenol and diclofenac topical gel. Discussed return precautions. I advised the patient to follow-up with their primary care provider this week. I advised the patient to return to the emergency department with new or worsening symptoms or new concerns. The patient verbalized understanding and agreement with plan.   This patient was discussed with Dr. Oleta Mouse who agrees with assessment and plan.   Final Clinical Impressions(s) / ED Diagnoses   Final diagnoses:  Acute bilateral low back pain without sciatica  Precordial pain    New Prescriptions New Prescriptions   ACETAMINOPHEN (TYLENOL) 325 MG TABLET    Take 2 tablets (650 mg total) by mouth every 6 (six) hours as needed for mild pain or moderate pain.   DICLOFENAC SODIUM 3 % GEL    Place 1 application onto the skin 2 (two) times daily. To affected area.     Waynetta Pean, PA-C 06/25/17 2044    Forde Dandy, MD 06/27/17 [phone]

## 2017-06-25 NOTE — ED Triage Notes (Signed)
Pt. Developed lower back pain 2 days ago.  Denies any injury, just bending over or twisting.  He also has noticed that he has developed intermittent chest pain sharp, non-radiating.  He denies any sob. N/v/d or fevers.  He does have bilateral leg swelling.  Pt. Is alert and oriented X3, Skin is p/w/d,  Pt. Denies any numbness or tingling in his legs

## 2017-06-25 NOTE — ED Notes (Signed)
Patient transported to X-ray 

## 2017-06-27 ENCOUNTER — Ambulatory Visit (INDEPENDENT_AMBULATORY_CARE_PROVIDER_SITE_OTHER): Payer: Medicare Other | Admitting: Internal Medicine

## 2017-06-27 DIAGNOSIS — G4733 Obstructive sleep apnea (adult) (pediatric): Secondary | ICD-10-CM | POA: Diagnosis not present

## 2017-06-27 DIAGNOSIS — M545 Low back pain, unspecified: Secondary | ICD-10-CM | POA: Insufficient documentation

## 2017-06-27 DIAGNOSIS — F17210 Nicotine dependence, cigarettes, uncomplicated: Secondary | ICD-10-CM

## 2017-06-27 DIAGNOSIS — Z23 Encounter for immunization: Secondary | ICD-10-CM | POA: Diagnosis not present

## 2017-06-27 DIAGNOSIS — Z791 Long term (current) use of non-steroidal anti-inflammatories (NSAID): Secondary | ICD-10-CM | POA: Diagnosis not present

## 2017-06-27 DIAGNOSIS — M8008XA Age-related osteoporosis with current pathological fracture, vertebra(e), initial encounter for fracture: Secondary | ICD-10-CM

## 2017-06-27 DIAGNOSIS — Z981 Arthrodesis status: Secondary | ICD-10-CM

## 2017-06-27 DIAGNOSIS — M8080XA Other osteoporosis with current pathological fracture, unspecified site, initial encounter for fracture: Secondary | ICD-10-CM

## 2017-06-27 DIAGNOSIS — J449 Chronic obstructive pulmonary disease, unspecified: Secondary | ICD-10-CM | POA: Diagnosis not present

## 2017-06-27 DIAGNOSIS — M810 Age-related osteoporosis without current pathological fracture: Secondary | ICD-10-CM | POA: Insufficient documentation

## 2017-06-27 DIAGNOSIS — I10 Essential (primary) hypertension: Secondary | ICD-10-CM | POA: Diagnosis not present

## 2017-06-27 DIAGNOSIS — Z79899 Other long term (current) drug therapy: Secondary | ICD-10-CM | POA: Diagnosis not present

## 2017-06-27 MED ORDER — TRAMADOL HCL 50 MG PO TABS: 50.0000 mg | ORAL_TABLET | Freq: Two times a day (BID) | ORAL | 0 refills | Status: DC | PRN

## 2017-06-27 MED ORDER — ALENDRONATE SODIUM 70 MG PO TABS: 70.0000 mg | ORAL_TABLET | ORAL | 11 refills | Status: DC

## 2017-06-27 NOTE — Patient Instructions (Signed)
For your back pain: Take Ibuprofen 800mg  (over the counter) twice a day  Take tramadol 50mg  (one pill) if you really need it to control your pain  For your osteoporosis, take phosphamax one pill once a week (pick a day of the week and keep it consistent), on an empty stomach and a full glass of water.

## 2017-06-27 NOTE — Progress Notes (Signed)
   CC: back pain  HPI:  [name] is a 71 y.o. with a PMH of HTN, OSA, COPD, presenting to clinic for evaluation of acute low back pain.  Patient woke up with low back pain about 4 days ago; pain is dull in nature, bilateral, without radiation, without numbness/tingling, without LE weakness or bowel/bladder incontinence. Patient states he has had some minor back pain before, but not like this. He is an otherwise active 71 yo and night prior to onset of symptoms, he was carrying in groceries and working around the house. Patient denies any falls or injuries to the area. He denies a personal or family history of cancer. Pain is worse with extension and movement. Patient went to ED 2 days ago for evaluation; Xrays at the time showed a remote T12 compression fracture and bilateral lumbar facet arthropathy. Patient was given morphine in the ED which relieved his pain for a couple of hours and was discharged with tylenol and voltaren gel. Patient states this has not helped his pain. He also endorses taking mobic chronically for his cervical spine pain (s/p fusion) which has not alleviated his pain. He took 800mg  ibuprofen today without relief. He denies h/o GI bleeding or PUD, and currently denies GERD symptoms, abd pain, melena or hematochezia.   Please see problem based Assessment and Plan for status of patients chronic conditions.  Past Medical History:  Diagnosis Date  . Anxiety and depression   . COPD (chronic obstructive pulmonary disease) ([place])   . GERD (gastroesophageal reflux disease)   . History of fall   . History of migraine headaches   . HTN (hypertension)   . Hyperlipemia   . Personal history of alcoholism ([place])   . Tobacco use   . Tubular adenoma 01/29/2008    Review of Systems:   ROS  Per HPI  Physical Exam:  Vitals:   06/27/17 1353  BP: 134/76  Pulse: 83  Temp: 97.8 F (36.6 C)  TempSrc: Oral  SpO2: 99%  Weight: 172 lb 6.4 oz (78.2 kg)   GENERAL- alert,  co-operative, appears as stated age, not in any distress. HEENT- Atraumatic, normocephalic CARDIAC- RRR, no murmurs, rubs or gallops. RESP- Moving equal volumes of air, and clear to auscultation bilaterally, no wheezes or crackles. ABDOMEN- Soft, nontender, bowel sounds present. NEURO- No obvious Cr N abnormality. EXTREMITIES- pulse 2+, symmetric, no pedal edema. TTP of lumbar spine and bil SI joints, unable to perform straight leg raise or hip ROM testing due to patient's discomfort on lying down. Strength intact, sensation intact. SKIN- Warm, dry, no rash or lesion. PSYCH- Normal mood and affect, appropriate thought content and speech.  Assessment & Plan:   See Encounters Tab for problem based charting.   Patient discussed with Dr. Nilsa Nutting, MD Internal Medicine PGY2

## 2017-06-28 ENCOUNTER — Encounter: Payer: Self-pay | Admitting: Internal Medicine

## 2017-06-28 NOTE — Assessment & Plan Note (Signed)
Patient with compression fracture of T12 consistent with diagnosis of osteoporosis.   Plan: --start fosamax 70mg  q7 days.

## 2017-06-28 NOTE — Progress Notes (Signed)
Internal Medicine Clinic Attending  Case discussed with Dr. Svalina  at the time of the visit.  We reviewed the resident's history and exam and pertinent patient test results.  I agree with the assessment, diagnosis, and plan of care documented in the resident's note.  

## 2017-06-28 NOTE — Assessment & Plan Note (Signed)
Patient with new onset acute low back pain in setting of arthritic changes seen on xrays, not responding to NSAID therapy. I discussed with patient the nature of arthritis, expectation of flares, importance of maintaining active lifestyle when able and at home management of flares.   Plan: --advised to take 800mg  ibuprofen BID  --given script for tramadol 50mg  #10 --f/u in 2 weeks if symptoms not resolved, worsening or new symptoms arising.

## 2017-07-12 ENCOUNTER — Encounter: Payer: Self-pay | Admitting: Internal Medicine

## 2017-07-12 ENCOUNTER — Ambulatory Visit (INDEPENDENT_AMBULATORY_CARE_PROVIDER_SITE_OTHER): Payer: Medicare Other | Admitting: Internal Medicine

## 2017-07-12 VITALS — BP 123/75 | HR 76 | Temp 98.9°F | Ht 67.5 in | Wt 170.7 lb

## 2017-07-12 DIAGNOSIS — M4854XD Collapsed vertebra, not elsewhere classified, thoracic region, subsequent encounter for fracture with routine healing: Secondary | ICD-10-CM | POA: Diagnosis not present

## 2017-07-12 DIAGNOSIS — Z1159 Encounter for screening for other viral diseases: Secondary | ICD-10-CM

## 2017-07-12 DIAGNOSIS — G8929 Other chronic pain: Secondary | ICD-10-CM | POA: Diagnosis not present

## 2017-07-12 DIAGNOSIS — I10 Essential (primary) hypertension: Secondary | ICD-10-CM | POA: Diagnosis not present

## 2017-07-12 DIAGNOSIS — M545 Low back pain, unspecified: Secondary | ICD-10-CM

## 2017-07-12 DIAGNOSIS — M542 Cervicalgia: Secondary | ICD-10-CM | POA: Diagnosis not present

## 2017-07-12 DIAGNOSIS — F17210 Nicotine dependence, cigarettes, uncomplicated: Secondary | ICD-10-CM | POA: Diagnosis not present

## 2017-07-12 DIAGNOSIS — Z23 Encounter for immunization: Secondary | ICD-10-CM

## 2017-07-12 DIAGNOSIS — E785 Hyperlipidemia, unspecified: Secondary | ICD-10-CM

## 2017-07-12 MED ORDER — PNEUMOCOCCAL 13-VAL CONJ VACC IM SUSP
0.5000 mL | Freq: Once | INTRAMUSCULAR | Status: AC
  Administered 2017-07-12: 0.5 mL via INTRAMUSCULAR

## 2017-07-12 MED ORDER — HYDROCODONE-ACETAMINOPHEN 5-325 MG PO TABS: 1.0000 | ORAL_TABLET | Freq: Four times a day (QID) | ORAL | 0 refills | Status: DC | PRN

## 2017-07-12 MED ORDER — GABAPENTIN 300 MG PO CAPS: 300.0000 mg | ORAL_CAPSULE | Freq: Two times a day (BID) | ORAL | 2 refills | Status: DC

## 2017-07-12 MED ORDER — PNEUMOCOCCAL 13-VAL CONJ VACC IM SUSP: 0.5000 mL | INTRAMUSCULAR | Status: DC

## 2017-07-12 MED ORDER — ATORVASTATIN CALCIUM 20 MG PO TABS: 20.0000 mg | ORAL_TABLET | Freq: Every day | ORAL | 3 refills | Status: DC

## 2017-07-12 MED ORDER — KETOROLAC TROMETHAMINE 30 MG/ML IJ SOLN
60.0000 mg | Freq: Once | INTRAMUSCULAR | Status: AC
  Administered 2017-07-12: 60 mg via INTRAMUSCULAR

## 2017-07-12 NOTE — Assessment & Plan Note (Addendum)
Patient continued to experience 10 over 10 back pain. According to patient he was unable to sleep for past many nights because of this pain. This pain was different than his usual chronic pain. On exam he was very tender along thoracic and lumbar spinal and paraspinal region, He was unable to lay down for a more thorough exam.  He had imaging done during his ED visit which was only positive for a remote chronic T12 compression fracture, which does not explain the extent of  his symptoms.   -MRI of thoracic and lumbar region, to rule out any other pathology or a metastatic disease.he does not has an history of any cancer, but it does not exclude the possibility of a metastatic disease.  -He was also given a prescription of Norco. -Follow up in 1-2 weeks.

## 2017-07-12 NOTE — Patient Instructions (Signed)
Thank you for visiting clinic today. I'm sorry that you are hurting that much. I'm giving you a prescription for Norco, you can take it every 4 hourly if needed for pain. I'm also ordering an MRI for your back. I'm also giving you  injection of Toradol today in the clinic for your pain. You can keep up your next week appointment with orthopedic physiatry for your back, they can help you getting a proper back brace if needed. Please follow-up in 1-2 weeks or earlier if your pain get worse.

## 2017-07-12 NOTE — Assessment & Plan Note (Signed)
BP Readings from Last 3 Encounters:  07/12/17 123/75  06/27/17 134/76  06/25/17 137/90   Patient remained normotensive without any antihypertensives.  Continue to monitor.

## 2017-07-12 NOTE — Progress Notes (Addendum)
   CC: For follow-up of his acute back pain.  HPI:  BradBrad Hubbard is a 71 y.o. gentleman with past medical history as listed below came to the clinic today for follow-up of his recent onset back pain.  He was seen in ED on 06/25/2017 after developing acute onset lower back pain, imaging obtained during that visit shows a remote T12 compression fracture. He was seen again in clinic on 06/27/2017 as ibuprofen 800 mg given during that ED visit was not helping. He was given tramadol and also started on Fosamax because of leg muscle of osteoporosis in the setting of compression fracture.  According to patient nothing is really helping with his pain, he describes his pain more than 10 over 10. He is unable to sleep for many nights because of this pain. He denies any radiation, tingling or numbness, lower extremity weakness, urinary or bowel incontinence. He was unable to recall any inciting event. He is using aspirin 1000 mg 4 times a day, along with ibuprofen 800 mg 3 times a day and tramadol with no relief. He also wants to try a chiropractor to get some relief. His friend make an appointment with a orthopedic physiatry for next week. He was also asking for any active brace which can help. He has a history of chronic back pain, mostly in his cervical area, used to take Norco for 5 years, stopped taking opioids last year. According to patient this pain is different than his previous pain.   He denies any urinary symptoms, recent change in his appetite, weight or bowel habits.  Addendum: BMP done as previous lab shows mild hyperkalemia is within normal limit. Hep C negative.  Past Medical History:  Diagnosis Date  . Anxiety and depression   . COPD (chronic obstructive pulmonary disease) ([place])   . GERD (gastroesophageal reflux disease)   . History of fall   . History of migraine headaches   . HTN (hypertension)   . Hyperlipemia   . Personal history of alcoholism ([place])   . Tobacco use   .  Tubular adenoma 01/29/2008   Review of Systems:  Negative except mentioned in history of present illness.  Physical Exam:  Vitals:   07/12/17 1335  BP: 123/75  Pulse: 76  Temp: 98.9 F (37.2 C)  TempSrc: Oral  SpO2: 99%  Weight: 170 lb 11.2 oz (77.4 kg)  Height: 5' 7.5" (1.715 m)    General: Vital signs reviewed.  Patient is well-developed and well-nourished, in no acute distress and cooperative with exam.  Head: Normocephalic and atraumatic. Cardiovascular: RRR, S1 normal, S2 normal, no murmurs, gallops, or rubs. Pulmonary/Chest: Clear to auscultation bilaterally, no wheezes, rales, or rhonchi. Abdominal: Soft, non-tender, non-distended, BS +, no masses, organomegaly, or guarding present.  Musculoskeletal: Tenderness along the lower thoracic and lumbar spinal and paraspinal area. Unable to performed raise leg because of his pain and his inability to lay down. Extremities: 1+ lower extremity edema bilaterally,  pulses symmetric and intact bilaterally. No cyanosis or clubbing. Neurological: A&O x3, Strength is normal and symmetric bilaterally, cranial nerve II-XII are grossly intact, no focal motor deficit, sensory intact to light touch bilaterally.  Skin: Warm, dry and intact. No rashes or erythema. Psychiatric: Normal mood and affect. speech and behavior is normal. Cognition and memory are normal.  Assessment & Plan:   See Encounters Tab for problem based charting.  Patient discussed with Dr. Lynnae January.

## 2017-07-13 LAB — BMP8+ANION GAP
Anion Gap: 19 mmol/L — ABNORMAL HIGH (ref 10.0–18.0)
BUN/Creatinine Ratio: 10 (ref 10–24)
BUN: 10 mg/dL (ref 8–27)
CALCIUM: 9.3 mg/dL (ref 8.6–10.2)
CHLORIDE: 101 mmol/L (ref 96–106)
CO2: 23 mmol/L (ref 20–29)
Creatinine, Ser: 0.98 mg/dL (ref 0.76–1.27)
GFR, EST AFRICAN AMERICAN: 89 mL/min/{1.73_m2} (ref >59)
GFR, EST NON AFRICAN AMERICAN: 77 mL/min/{1.73_m2} (ref >59)
Glucose: 87 mg/dL (ref 65–99)
Potassium: 4.5 mmol/L (ref 3.5–5.2)
Sodium: 143 mmol/L (ref 134–144)

## 2017-07-13 LAB — HEPATITIS C ANTIBODY: Hep C Virus Ab: <0.1 s/co ratio (ref 0.0–0.9)

## 2017-07-15 NOTE — Progress Notes (Signed)
Internal Medicine Clinic Attending  Case discussed with Dr. Amin at the time of the visit.  We reviewed the resident's history and exam and pertinent patient test results.  I agree with the assessment, diagnosis, and plan of care documented in the resident's note.    

## 2017-07-21 ENCOUNTER — Other Ambulatory Visit: Payer: Self-pay | Admitting: Internal Medicine

## 2017-07-21 DIAGNOSIS — J449 Chronic obstructive pulmonary disease, unspecified: Secondary | ICD-10-CM

## 2017-07-22 NOTE — Addendum Note (Signed)
Addended by: Lorella Nimrod on: 07/22/2017 02:46 PM   Modules accepted: Orders

## 2017-07-23 ENCOUNTER — Ambulatory Visit (INDEPENDENT_AMBULATORY_CARE_PROVIDER_SITE_OTHER): Payer: Medicare Other | Admitting: Physical Medicine and Rehabilitation

## 2017-07-23 ENCOUNTER — Encounter (INDEPENDENT_AMBULATORY_CARE_PROVIDER_SITE_OTHER): Payer: Self-pay | Admitting: Physical Medicine and Rehabilitation

## 2017-07-23 VITALS — BP 131/86 | HR 67

## 2017-07-23 DIAGNOSIS — M545 Low back pain, unspecified: Secondary | ICD-10-CM

## 2017-07-23 DIAGNOSIS — M47816 Spondylosis without myelopathy or radiculopathy, lumbar region: Secondary | ICD-10-CM | POA: Diagnosis not present

## 2017-07-23 MED ORDER — PREDNISONE 50 MG PO TABS: ORAL_TABLET | ORAL | 0 refills | Status: DC

## 2017-07-23 NOTE — Progress Notes (Deleted)
Lower back pain. Taking hydrocodone. Says he has never seen anyone for pain management before, but he has had surgery performed by Dr. Vertell Limber. Pain is across lower back and does not radiate. Pain is constant.. 1 week.  500 ASA, hydrocodone bid.

## 2017-07-23 NOTE — Progress Notes (Signed)
[name] - 71 y.o. male MRN [id number]  Date of birth: November 01, 1945  Office Visit Note: Visit Date: 07/23/2017 PCP: Lorella Nimrod, MD Referred by: Lorella Nimrod, MD  Subjective: Chief Complaint  Patient presents with  . Lower Back - Pain   HPI: [name] is a 71 year old gentleman who comes in today as a new patient with history of acute severe back pain on 06/23/2017.  He reports that his back started hurting really without incident or trauma.  He reports pain across the lower back but worse in any position but particularly with movement and standing.  He does get some pain at night but it does not awaken him.  He has had prior history of ACDF by Dr. Vertell Limber.  He has had no lumbar surgery.  He has had a history of intermittent back pain off and on.  Medical history is reviewed below.  The emergency department did perform lumbar spine x-ray.  That is also reviewed below.  This shows a T12 febrile body height loss which is chronic could be congenital.  There is also facet arthropathy at L4-5 and L5-S1.  There was no listhesis or scoliosis.  He reports no pain down the legs no paresthesias.  He has had no fevers or chills or night sweats.  He has had no unexplained weight loss.  He has no history of cancer.  He does have a history of tobacco use and alcoholism.  He has a history of anxiety and depression.  He was already taking gabapentin and meloxicam.  He recently saw his internal medicine doctor Dr. Reesa Chew.  He was started on small dose of hydrocodone.  He says this is not helping very much either.  He is taking 500 mg aspirin as well.  He has not noted any focal weakness.  He has not had recent physical therapy.  He does have an MRI of the lumbar and thoracic spine on order from his internal medicine doctor.    Review of Systems  Constitutional: Positive for malaise/fatigue. Negative for chills, fever and weight loss.  HENT: Negative for hearing loss and sinus pain.   Eyes: Negative for  blurred vision, double vision and photophobia.  Respiratory: Negative for cough and shortness of breath.   Cardiovascular: Negative for chest pain, palpitations and leg swelling.  Gastrointestinal: Negative for abdominal pain, nausea and vomiting.  Genitourinary: Negative for dysuria and flank pain.  Musculoskeletal: Positive for back pain. Negative for myalgias.  Skin: Negative for itching and rash.  Neurological: Negative for tingling, tremors, focal weakness, weakness and headaches.  Endo/Heme/Allergies: Negative.   Psychiatric/Behavioral: Negative for depression.  All other systems reviewed and are negative.  Otherwise per HPI.  Assessment & Plan: Visit Diagnoses:  1. Acute midline low back pain without sciatica   2. Spondylosis without myelopathy or radiculopathy, lumbar region     Plan: Findings:  Acute presentation of severe low back pain.  History of chronic off-and-on back pain.  History of prior cervical fusion by Dr. Vertell Limber.  No radicular complaints or weakness.  At this point I think the best approach is to add 50 mg of prednisone for a 5-day course.  Take the hydrocodone provided by his internal medicine doctor as needed.  This would not represent a situation we would use chronic opioids but in the acute setting I think that is reasonable.  He is going to follow-up with his internal medicine doctor they could discuss that.  There is an MRI pending and ordered.  Hopefully the prednisone will help while waiting for the MRI.  Further treatment could be looked at depending on the MRI.  Even an acute severe flareups of back pain these are usually self-limited.  This is only been going on for 1 month.  There are no other red flag signs or symptoms.    Meds & Orders:  Meds ordered this encounter  Medications  . predniSONE (DELTASONE) 50 MG tablet    Sig: Take 1 tablet daily with food for 5 days until finished    Dispense:  5 tablet    Refill:  0   No orders of the defined types were  placed in this encounter.   Follow-up: Return for MRI review after completion.   Procedures: No procedures performed  No notes on file   Clinical History: LUMBAR SPINE - COMPLETE 4+ VIEW  COMPARISON:  07/20/2016  FINDINGS: Five lumbar type vertebral bodies. Sacroiliac joints are symmetric. T12 vertebral body height loss is chronic and could be partially developmental (butterfly vertebra). Mild spondylosis with loss of intervertebral disc height at the lumbosacral junction. Facet arthropathy at L4-5 and L5-S1. Aortic atherosclerosis.  IMPRESSION: No acute osseous abnormality.   Electronically Signed   By: Abigail Miyamoto M.D.   On: 06/25/2017 18:29  He reports that he has been smoking Cigarettes.  He has a 27.50 pack-year smoking history. He has never used smokeless tobacco. No results for input(s): HGBA1C, LABURIC in the last 8760 hours.  Objective:  VS:  HT:    WT:   BMI:     BP:131/86  HR:67bpm  TEMP: ( )  RESP:  Physical Exam  Constitutional: He is oriented to person, place, and time. He appears well-developed and well-nourished. No distress.  HENT:  Head: Normocephalic and atraumatic.  Nose: Nose normal.  Mouth/Throat: Oropharynx is clear and moist.  Eyes: Pupils are equal, round, and reactive to light. Conjunctivae are normal.  Neck: Normal range of motion. Neck supple.  Cardiovascular: Regular rhythm and intact distal pulses.   Pulmonary/Chest: Effort normal and breath sounds normal.  Abdominal: Soft. He exhibits no distension.  Musculoskeletal: He exhibits no deformity.  Patient ambulates without aid with forward flexed lumbar spine.  Does have pain in the paraspinal musculature particularly quadratus lumborum with muscle spasm.  He has no pain over the greater trochanters.  He has no pain with hip rotation.  He has good distal strength.  He can toe and heel walk.  He has 2+ muscle stretch reflexes in the quadriceps and gastrocnemius.  He has no clonus  bilaterally.  Neurological: He is alert and oriented to person, place, and time.  Skin: Skin is warm. No rash noted.  Psychiatric: He has a normal mood and affect. His behavior is normal.  Nursing note and vitals reviewed.   Ortho Exam Imaging: No results found.  Past Medical/Family/Surgical/Social History: Medications & Allergies reviewed per EMR Patient Active Problem List   Diagnosis Date Noted  . Acute low back pain 06/27/2017  . Osteoporosis 06/27/2017  . Trigger little finger of left hand 04/12/2017  . Abnormal CT of liver 08/13/2016  . Leg cramps 08/13/2016  . Stable angina ([place]) 08/13/2016  . OSA (obstructive sleep apnea) 08/23/2014  . Hx of migraines 05/26/2014  . Back pain, chronic 05/26/2014  . History of colonic polyps 12/29/2012  . Hyperlipemia 11/12/2012  . COPD (chronic obstructive pulmonary disease) ([place]) 11/12/2012  . Leg swelling 11/12/2012  . HTN (hypertension) 11/12/2012  . Tobacco use 11/12/2012  . Healthcare  maintenance 11/12/2012   Past Medical History:  Diagnosis Date  . Anxiety and depression   . COPD (chronic obstructive pulmonary disease) ([place])   . GERD (gastroesophageal reflux disease)   . History of fall   . History of migraine headaches   . HTN (hypertension)   . Hyperlipemia   . Personal history of alcoholism ([place])   . Tobacco use   . Tubular adenoma 01/29/2008   Family History  Problem Relation Age of Onset  . Heart attack Mother   . Heart attack Father   . Stroke Sister   . Heart attack Brother   . Heart attack Sister    Past Surgical History:  Procedure Laterality Date  . ANTERIOR FUSION CERVICAL SPINE  2005   two surgeries after two mechanical falls, per Dr. Donald Pore  . COLONOSCOPY W/ BIOPSIES  01/29/2008   Dr. Silvano Rusk  . [place]   correction for crossed-eyes  . [place]   Social History   Occupational History  . On disability    . Service Engineer, drilling     he worked at [employer] in  [place]  . 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 Smoking status: Current Every Day Smoker    Packs/day: 0.50    Years: 55.00    Types: Cigarettes  . Smokeless tobacco: Never Used  . Alcohol use 0.0 oz/week     Comment: Beer-rarely.  . Drug use: No  . Sexual activity: Not on file

## 2017-08-02 ENCOUNTER — Ambulatory Visit (INDEPENDENT_AMBULATORY_CARE_PROVIDER_SITE_OTHER): Payer: Medicare Other | Admitting: Internal Medicine

## 2017-08-02 ENCOUNTER — Encounter: Payer: Self-pay | Admitting: Internal Medicine

## 2017-08-02 ENCOUNTER — Encounter (INDEPENDENT_AMBULATORY_CARE_PROVIDER_SITE_OTHER): Payer: Self-pay

## 2017-08-02 DIAGNOSIS — M7989 Other specified soft tissue disorders: Secondary | ICD-10-CM

## 2017-08-02 DIAGNOSIS — Z79891 Long term (current) use of opiate analgesic: Secondary | ICD-10-CM | POA: Diagnosis not present

## 2017-08-02 DIAGNOSIS — F17210 Nicotine dependence, cigarettes, uncomplicated: Secondary | ICD-10-CM | POA: Diagnosis not present

## 2017-08-02 DIAGNOSIS — Z8781 Personal history of (healed) traumatic fracture: Secondary | ICD-10-CM | POA: Diagnosis not present

## 2017-08-02 DIAGNOSIS — I10 Essential (primary) hypertension: Secondary | ICD-10-CM | POA: Diagnosis not present

## 2017-08-02 DIAGNOSIS — M545 Low back pain, unspecified: Secondary | ICD-10-CM

## 2017-08-02 MED ORDER — HYDROCODONE-ACETAMINOPHEN 5-325 MG PO TABS: 1.0000 | ORAL_TABLET | Freq: Four times a day (QID) | ORAL | 0 refills | Status: DC | PRN

## 2017-08-02 MED ORDER — FUROSEMIDE 20 MG PO TABS: 20.0000 mg | ORAL_TABLET | Freq: Every day | ORAL | 11 refills | Status: DC

## 2017-08-02 NOTE — Patient Instructions (Signed)
Thank you for visiting clinic today. I'm giving you more Percocet for pain, you can take it up to 3 times a day. Please go for your MRI which is scheduled for 08/09/2017. You can also make an appointment with your orthopedic after MRI. We will call you with the results. i'm also giving you a water pill called furosemide or Lasix 20 mg daily, for your leg swelling. Please follow-up in 4 weeks. We will check your blood for potassium during that visit as Lasix can decrease your potassium.

## 2017-08-02 NOTE — Progress Notes (Signed)
   CC: for follow-up of his back pain, complains of worsening lower extremity swelling.  HPI:  BradBrad Hubbard is a 71 y.o. with past medical history listed below came to the clinic for follow-up of his back pain, he continued to experience back pain which interfere with his daily life. Pain is not effecting his sleep, continued to have pain in lying down or sitting up. Pain is nonradiating and he denies any focal neurologic deficit.  Norco help for a short duration, he was taking Norco just at bedtime. He was sent for MRI of thoracolumbar spine, which is scheduled for 08/09/2017. He has history of T12 compression fracture.  Past Medical History:  Diagnosis Date  . Anxiety and depression   . COPD (chronic obstructive pulmonary disease) ([place])   . GERD (gastroesophageal reflux disease)   . History of fall   . History of migraine headaches   . HTN (hypertension)   . Hyperlipemia   . Personal history of alcoholism ([place])   . Tobacco use   . Tubular adenoma 01/29/2008   Review of Systems:  As HPI.  Physical Exam:  Vitals:   08/02/17 1436  BP: 105/61  Pulse: 66  Temp: 98.3 F (36.8 C)  TempSrc: Oral  SpO2: 99%  Weight: 175 lb 3.2 oz (79.5 kg)  Height: 5' 7.5" (1.715 m)    General: Vital signs reviewed.  Patient is well-developed and well-nourished, in no acute distress and cooperative with exam.  Head: Normocephalic and atraumatic. Cardiovascular: RRR, S1 normal, S2 normal, no murmurs, gallops, or rubs. Pulmonary/Chest: Clear to auscultation bilaterally, no wheezes, rales, or rhonchi. Abdominal: Soft, non-tender, non-distended, BS +, no masses, organomegaly, or guarding present.  Musculoskeletal: Tenderness along the lower thoracic and lumbar spinal area, restricted range of motion due to pain. Extremities: 2+ lower extremity edema bilaterally,  pulses symmetric and intact bilaterally.  Neurological: A&O x3, Strength is normal and symmetric bilaterally, cranial nerve II-XII  are grossly intact, no focal motor deficit, sensory intact to light touch bilaterally.  Skin: Warm, dry and intact. No rashes or erythema. Psychiatric: Normal mood and affect. speech and behavior is normal.   Assessment & Plan:   See Encounters Tab for problem based charting.  Patient discussed with Dr. Lynnae Hubbard.

## 2017-08-05 NOTE — Assessment & Plan Note (Signed)
He was having worsening lower extremity edema, according to patient he use to take Lasix as a when necessary basis before, currently out of prescription and not taking any Lasix.  He uses compression stockings at night. He denies any worsening dyspnea, orthopnea or PND. Most likely due to venous stasis.  -Lasix 20 mg daily. -Advised to use compression stockings when on feet and keep her legs elevated while sitting and sleeping.

## 2017-08-05 NOTE — Assessment & Plan Note (Signed)
BP Readings from Last 3 Encounters:  08/02/17 105/61  07/23/17 131/86  07/12/17 123/75   Pressure was little soft today. Patient denies any dizziness. He is not on any antihypertensives currently, verapamil was stopped approximately a year ago.  Continue monitoring.

## 2017-08-05 NOTE — Assessment & Plan Note (Addendum)
He continued to experience back pain which interferes significantly with his daily activities. He did visited orthopedic surgery, they  recommended to follow up after MRI . MRI scheduled for 08/09/2017 .  Patient appears in significant pain with tenderness along the lower thoracic and lumbar area.  -I gave him another prescription for Norco , he can take it up to 3 times a day if needed. -We will follow-up with MRI results.

## 2017-08-07 ENCOUNTER — Encounter (INDEPENDENT_AMBULATORY_CARE_PROVIDER_SITE_OTHER): Payer: Self-pay | Admitting: Physical Medicine and Rehabilitation

## 2017-08-07 NOTE — Progress Notes (Signed)
Internal Medicine Clinic Attending  Case discussed with Dr. Amin at the time of the visit.  We reviewed the resident's history and exam and pertinent patient test results.  I agree with the assessment, diagnosis, and plan of care documented in the resident's note.    

## 2017-08-09 ENCOUNTER — Ambulatory Visit (HOSPITAL_COMMUNITY)
Admission: RE | Admit: 2017-08-09 | Discharge: 2017-08-09 | Disposition: A | Discharge status: Home / Self Care | Payer: Medicare Other | Source: Ambulatory Visit | Attending: Internal Medicine | Admitting: Internal Medicine

## 2017-08-09 DIAGNOSIS — M5126 Other intervertebral disc displacement, lumbar region: Secondary | ICD-10-CM | POA: Diagnosis not present

## 2017-08-09 DIAGNOSIS — M5146 Schmorl's nodes, lumbar region: Secondary | ICD-10-CM | POA: Insufficient documentation

## 2017-08-09 DIAGNOSIS — M545 Low back pain, unspecified: Secondary | ICD-10-CM

## 2017-08-09 DIAGNOSIS — M47816 Spondylosis without myelopathy or radiculopathy, lumbar region: Secondary | ICD-10-CM | POA: Insufficient documentation

## 2017-08-09 DIAGNOSIS — G8929 Other chronic pain: Secondary | ICD-10-CM

## 2017-08-09 DIAGNOSIS — Z8781 Personal history of (healed) traumatic fracture: Secondary | ICD-10-CM | POA: Insufficient documentation

## 2017-08-09 DIAGNOSIS — M4807 Spinal stenosis, lumbosacral region: Secondary | ICD-10-CM | POA: Diagnosis not present

## 2017-08-09 DIAGNOSIS — M549 Dorsalgia, unspecified: Secondary | ICD-10-CM | POA: Diagnosis not present

## 2017-08-14 ENCOUNTER — Encounter (INDEPENDENT_AMBULATORY_CARE_PROVIDER_SITE_OTHER): Payer: Self-pay | Admitting: Physical Medicine and Rehabilitation

## 2017-08-14 ENCOUNTER — Ambulatory Visit (INDEPENDENT_AMBULATORY_CARE_PROVIDER_SITE_OTHER): Payer: Medicare Other | Admitting: Physical Medicine and Rehabilitation

## 2017-08-14 VITALS — BP 126/84 | HR 65

## 2017-08-14 DIAGNOSIS — M545 Low back pain, unspecified: Secondary | ICD-10-CM

## 2017-08-14 DIAGNOSIS — M47816 Spondylosis without myelopathy or radiculopathy, lumbar region: Secondary | ICD-10-CM

## 2017-08-14 DIAGNOSIS — S32040A Wedge compression fracture of fourth lumbar vertebra, initial encounter for closed fracture: Secondary | ICD-10-CM

## 2017-08-14 DIAGNOSIS — M48061 Spinal stenosis, lumbar region without neurogenic claudication: Secondary | ICD-10-CM | POA: Diagnosis not present

## 2017-08-14 NOTE — Progress Notes (Deleted)
Here for MRI review. Pain is unchanged since OV 08/07/17.

## 2017-08-19 ENCOUNTER — Encounter (INDEPENDENT_AMBULATORY_CARE_PROVIDER_SITE_OTHER): Payer: Self-pay | Admitting: Physical Medicine and Rehabilitation

## 2017-08-19 NOTE — Progress Notes (Signed)
[name] - 71 y.o. male MRN [id number]  Date of birth: October 19, 1945  Office Visit Note: Visit Date: 08/14/2017 PCP: Lorella Nimrod, MD Referred by: Lorella Nimrod, MD  Subjective: Chief Complaint  Patient presents with  . Lower Back - Pain   HPI: [name] is a 71 year old gentleman with history of chronic pain syndrome and prior ACDF by Dr. Vertell Limber.  We saw him recently with report of acute onset of low back pain on 06/23/2017.  He reported no specific injury.  He had an MRI on order from his primary care physician.  We gave him a trial of prednisone because he had failed to get any relief with anti-inflammatory as well as gabapentin.  He also had a small dose of hydrocodone that helped to a degree.  He comes in today for MRI review this is reviewed below and reviewed with the patient.  He has acute L4 Schmorl's node and compression fracture.  He does have a history of osteoporosis.  Low back pain just like only saw him before with no relief.  MRI does not show any retropulsion or canal compromise.  He has no radicular pain.    Review of Systems  Constitutional: Negative for chills, fever, malaise/fatigue and weight loss.  HENT: Negative for hearing loss and sinus pain.   Eyes: Negative for blurred vision, double vision and photophobia.  Respiratory: Negative for cough and shortness of breath.   Cardiovascular: Negative for chest pain, palpitations and leg swelling.  Gastrointestinal: Negative for abdominal pain, nausea and vomiting.  Genitourinary: Negative for flank pain.  Musculoskeletal: Positive for back pain. Negative for myalgias.  Skin: Negative for itching and rash.  Neurological: Negative for tremors, focal weakness and weakness.  Endo/Heme/Allergies: Negative.   Psychiatric/Behavioral: Negative for depression.  All other systems reviewed and are negative.  Otherwise per HPI.  Assessment & Plan: Visit Diagnoses:  1. Acute midline low back pain without sciatica   2.  Spondylosis without myelopathy or radiculopathy, lumbar region   3. Closed compression fracture of fourth lumbar vertebra, initial encounter [facility])     Plan: Findings:  Acute low back pain which is severe which started on 06/23/2017 with a definitive date.  He reports no specific injury or falls.  X-ray of the lumbar spine was reviewed again and this was done on 06/25/2017 does not show any compression fracture at L4.  MRI was ordered by his primary care physician before I saw him and this is now reviewed with him and does show acute L4 compression fracture and Schmorl's node.  At this point we are going to refer him to Dr. Phylliss Bob for possible kyphoplasty evaluation.  We are also going to send him to [facility] for lumbar brace.  We are also going to provide him with short-term pain medication.  We discussed the ultimate outcome of compression fractures and these usually do get to a point where there is pain relief.  He needs to follow-up with his primary care physician for osteoporosis management.  We did discuss this.    Meds & Orders: No orders of the defined types were placed in this encounter.   Orders Placed This Encounter  Procedures  . Ambulatory referral to Orthopedic Surgery    Follow-up: Return if symptoms worsen or fail to improve.   Procedures: No procedures performed  No notes on file   Clinical History: MRI LUMBAR SPINE WITHOUT AND WITH CONTRAST  TECHNIQUE: Multiplanar and multiecho pulse sequences of the lumbar spine were  obtained without and with intravenous contrast.  CONTRAST:  15 cc MultiHance  COMPARISON:  Radiographs dated 06/25/2017 and CT scans of the abdomen dated 03/23/2016 and 05/20/2008  FINDINGS: Segmentation:  Standard.  Alignment:  Physiologic.  Vertebrae: Acute large Schmorl's node in the inferior endplate of L4 with diffuse edema and diffuse enhancement of the L4 vertebral body. Trabecular pattern is preserved in the upper part of the  vertebral body suggesting that this is a benign compression fracture. No appreciable mass.  Old prominent Schmorl's node in the inferior endplate of L2. Old compression fracture of T12, completely healed.  Conus medullaris: Extends to the L1 level and appears normal except for a tiny lipoma of the filum terminale.  Paraspinal and other soft tissues: Negative.  Disc levels:  L1-2: Disc desiccation. Tiny broad-based disc bulge extending into the left neural foramen. No neural impingement.  L2-3: Disc desiccation. Chronic Schmorl's node in the inferior endplate of L2. Tiny broad-based disc bulge with no neural impingement.  L3-4: Tiny disc bulges into the neural foramina without neural impingement.  L4-5: Minimal broad-based disc bulge without neural impingement. Acute large Schmorl's node in the inferior endplate of L1 with prominent edema and enhancement of the entire L4 vertebral body without a visible mass. Slight bilateral facet arthritis with inflammation around both facet joints, left greater than right, as indicated by enhancement after contrast.  L5-S1: Small broad-based disc bulge with tiny central annular fissure. Moderately severe to severe left foraminal stenosis due to facet joint hypertrophy.  IMPRESSION: 1. Acute large Schmorl's node benign-appearing in the inferior endplate of L4 with edema and abnormal enhancement of the entire L4 vertebral body. The trabecular pattern is maintained in the upper portion of the vertebral body which suggests that there is not an infiltrating process in the vertebra. 2. Bilateral facet arthritis and inflammation at L4-5. 3. Moderately severe to severe left foraminal stenosis at L5-S1. 4. Old compression fracture of T12.   Electronically Signed   By: Lorriane Shire M.D.   On: 08/09/2017 16:31  LUMBAR SPINE - COMPLETE 4+ VIEW  COMPARISON: 07/20/2016  FINDINGS: Five lumbar type vertebral bodies. Sacroiliac  joints are symmetric. T12 vertebral body height loss is chronic and could be partially developmental (butterfly vertebra). Mild spondylosis with loss of intervertebral disc height at the lumbosacral junction. Facet arthropathy at L4-5 and L5-S1. Aortic atherosclerosis.  IMPRESSION: No acute osseous abnormality.   Electronically Signed By: Abigail Miyamoto M.D. On: 06/25/2017 18:29  He reports that he has been smoking cigarettes.  He has a 27.50 pack-year smoking history. he has never used smokeless tobacco. No results for input(s): HGBA1C, LABURIC in the last 8760 hours.  Objective:  VS:  HT:    WT:   BMI:     BP:126/84  HR:65bpm  TEMP: ( )  RESP:  Physical Exam  Constitutional: He is oriented to person, place, and time. He appears well-developed and well-nourished. No distress.  HENT:  Head: Normocephalic and atraumatic.  Eyes: Conjunctivae are normal. Pupils are equal, round, and reactive to light.  Neck: Normal range of motion. Neck supple.  Cardiovascular: Regular rhythm and intact distal pulses.  Pulmonary/Chest: Effort normal. No respiratory distress.  Musculoskeletal:  Patient is slow to rise from a seated position.  He does have pain with extension of the lumbar spine.  He has no pain over the [place].  He has no pain with hip rotation he has good distal strength without clonus.  Some paraspinal tenderness and spasm.  Neurological: He  is alert and oriented to person, place, and time.  Skin: Skin is warm and dry. No rash noted. No erythema.  Psychiatric: He has a normal mood and affect.  Nursing note and vitals reviewed.   Ortho Exam Imaging: No results found.  Past Medical/Family/Surgical/Social History: Medications & Allergies reviewed per EMR Patient Active Problem List   Diagnosis Date Noted  . Acute low back pain 06/27/2017  . Osteoporosis 06/27/2017  . Trigger little finger of left hand 04/12/2017  . Abnormal CT of liver 08/13/2016  . Leg cramps 08/13/2016  .  Stable angina ([place]) 08/13/2016  . OSA (obstructive sleep apnea) 08/23/2014  . Hx of migraines 05/26/2014  . Back pain, chronic 05/26/2014  . History of colonic polyps 12/29/2012  . Hyperlipemia 11/12/2012  . COPD (chronic obstructive pulmonary disease) ([place]) 11/12/2012  . Leg swelling 11/12/2012  . HTN (hypertension) 11/12/2012  . Tobacco use 11/12/2012  . Healthcare maintenance 11/12/2012   Past Medical History:  Diagnosis Date  . Anxiety and depression   . COPD (chronic obstructive pulmonary disease) ([place])   . GERD (gastroesophageal reflux disease)   . History of fall   . History of migraine headaches   . HTN (hypertension)   . Hyperlipemia   . Personal history of alcoholism ([place])   . Tobacco use   . Tubular adenoma 01/29/2008   Family History  Problem Relation Age of Onset  . Heart attack Mother   . Heart attack Father   . Stroke Sister   . Heart attack Brother   . Heart attack Sister    Past Surgical History:  Procedure Laterality Date  . ANTERIOR FUSION CERVICAL SPINE  2005   two surgeries after two mechanical falls, per Dr. Donald Pore  . COLONOSCOPY W/ BIOPSIES  01/29/2008   Dr. Silvano Rusk  . [place]   correction for crossed-eyes  . [place]   Social History   Occupational History  . Occupation: On disability   . Occupation: Estate manager/land agent    Comment: he worked at [employer] in [place]  . Occupation: Child psychotherapist: [employer]    Comment: years ago  Tobacco Use  . Smoking status: Current Every Day Smoker    Packs/day: 0.50    Years: 55.00    Pack years: 27.50    Types: Cigarettes  . Smokeless tobacco: Never Used  Substance and Sexual Activity  . Alcohol use: Yes    Alcohol/week: 0.0 oz    Comment: Beer-rarely.  . Drug use: No  . Sexual activity: Not on file

## 2017-08-27 DIAGNOSIS — M545 Low back pain: Secondary | ICD-10-CM | POA: Diagnosis not present

## 2017-09-04 ENCOUNTER — Ambulatory Visit (INDEPENDENT_AMBULATORY_CARE_PROVIDER_SITE_OTHER): Payer: Medicare Other | Admitting: Internal Medicine

## 2017-09-04 ENCOUNTER — Other Ambulatory Visit: Payer: Self-pay | Admitting: Internal Medicine

## 2017-09-04 ENCOUNTER — Other Ambulatory Visit: Payer: Self-pay

## 2017-09-04 VITALS — BP 135/82 | HR 89 | Temp 97.8°F | Ht 67.0 in | Wt 176.5 lb

## 2017-09-04 DIAGNOSIS — G8929 Other chronic pain: Secondary | ICD-10-CM

## 2017-09-04 DIAGNOSIS — R600 Localized edema: Secondary | ICD-10-CM

## 2017-09-04 DIAGNOSIS — M7989 Other specified soft tissue disorders: Secondary | ICD-10-CM | POA: Diagnosis not present

## 2017-09-04 DIAGNOSIS — M545 Low back pain, unspecified: Secondary | ICD-10-CM

## 2017-09-04 MED ORDER — HYDROCODONE-ACETAMINOPHEN 5-325 MG PO TABS: 1.0000 | ORAL_TABLET | Freq: Four times a day (QID) | ORAL | 0 refills | Status: DC | PRN

## 2017-09-04 NOTE — Progress Notes (Signed)
   CC: Leg swelling, back pain  HPI:  [name] is a 71 y.o. male with PMHx detailed below presenting for follow up of his low back pain and leg swelling.  See problem based assessment and plan below for additional details  Leg swelling This seems to be mostly from venous insufficiency as he is without other evidence of heart failure or volume overload. He wears tight high socks but not actual compression stockings for this. He is having a good benefit with lasix 20mg  daily. He denies any trouble with lightheadedness or cramps. Assessment Chronic lower extremity edema with suspected venous insufficiency, improving on low dose diuresis Plan Continue lasix 20mg  daily Metabolic panel today  Acute low back pain He is suffering from increased low back pain due to L4 vertebral compression fracture seen on lumbar spine MRI. He has been evaluated at PM&R clinic and subsequently at orthopedic surgery for evaluation and management. He he was started on a back brace and observation. The plan is to assess for spontaneous stabilization and improvement in pain within the next 1-2 months or otherwise could consider kyphoplasty for symptomatic relief. His pain is partially improved by wearing the back brace but still bothers him worst especially with ambulation.  He has had relief of symptoms with low-dose hydrocodone-acetaminophen that he takes 0-1 tablets daily. He thinks this improves his symptoms allowing for less discomfort with walking or bleeding chores around the house. He has not noticed any trouble with constipation or excessive sleepiness with taking this medicine. Assessment Low back pain due to L4 vertebral compression fracture He is already on treatment with alendronate I think the benefit outweighs the risk to continue low-dose opioid pain medicine for this next one month all observing him for spontaneous healing inner to reduce the risk of deconditioning Plan Continue Fosamax  weekly Continue back brace at all times while ambulatory Prescribed Norco 5 mg #30 tablets every 6 hours as needed for low back pain Follow-up with orthopedic surgery clinic in one month  .  Past Medical History:  Diagnosis Date  . Anxiety and depression   . COPD (chronic obstructive pulmonary disease) ([place])   . GERD (gastroesophageal reflux disease)   . History of fall   . History of migraine headaches   . HTN (hypertension)   . Hyperlipemia   . Personal history of alcoholism ([place])   . Tobacco use   . Tubular adenoma 01/29/2008    Review of Systems: Review of Systems  Constitutional: Negative for malaise/fatigue.  Respiratory: Negative for shortness of breath.   Cardiovascular: Positive for leg swelling.  Gastrointestinal: Negative for constipation and nausea.  Musculoskeletal: Positive for back pain. Negative for falls.  Neurological: Negative for dizziness.     Physical Exam: Vitals:   09/04/17 1311  BP: 135/82  Pulse: 89  Temp: 97.8 F (36.6 C)  TempSrc: Oral  SpO2: 96%  Weight: 176 lb 8 oz (80.1 kg)  Height: 5\' 7"  (1.702 m)   GENERAL- alert, co-operative, NAD CARDIAC- RRR, no murmurs, rubs or gallops. RESP- CTAB, no wheezes or crackles. ABDOMEN- Soft, nontender, no guarding or rebound, normoactive bowel sounds present BACK- Wearing back stabilization brace NEURO- Strength lower extremities- 5/5, Sensation intact globally EXTREMITIES- Trace pedal edema bilaterally SKIN- Warm, dry, No rash or lesion.    Assessment & Plan:   See encounters tab for problem based medical decision making.   Patient discussed with Dr. Angelia Mould

## 2017-09-04 NOTE — Patient Instructions (Addendum)
FOLLOW-UP INSTRUCTIONS When: 2 months For: Back pain and leg swelling What to bring: Medications   We checked for your kidney function and potassium with the lasix. We will call if there is any problem needing a change in treatment.

## 2017-09-05 LAB — BMP8+ANION GAP
ANION GAP: 13 mmol/L (ref 10.0–18.0)
BUN/Creatinine Ratio: 8 — ABNORMAL LOW (ref 10–24)
BUN: 8 mg/dL (ref 8–27)
CHLORIDE: 104 mmol/L (ref 96–106)
CO2: 24 mmol/L (ref 20–29)
Calcium: 9 mg/dL (ref 8.6–10.2)
Creatinine, Ser: 1.01 mg/dL (ref 0.76–1.27)
GFR calc Af Amer: 86 mL/min/{1.73_m2} (ref >59)
GFR calc non Af Amer: 74 mL/min/{1.73_m2} (ref >59)
Glucose: 86 mg/dL (ref 65–99)
POTASSIUM: 4.4 mmol/L (ref 3.5–5.2)
SODIUM: 141 mmol/L (ref 134–144)

## 2017-09-06 ENCOUNTER — Encounter: Payer: Self-pay | Admitting: *Deleted

## 2017-09-06 NOTE — Assessment & Plan Note (Signed)
He is suffering from increased low back pain due to L4 vertebral compression fracture seen on lumbar spine MRI. He has been evaluated at PM&R clinic and subsequently at orthopedic surgery for evaluation and management. He he was started on a back brace and observation. The plan is to assess for spontaneous stabilization and improvement in pain within the next 1-2 months or otherwise could consider kyphoplasty for symptomatic relief. His pain is partially improved by wearing the back brace but still bothers him worst especially with ambulation.  He has had relief of symptoms with low-dose hydrocodone-acetaminophen that he takes 0-1 tablets daily. He thinks this improves his symptoms allowing for less discomfort with walking or bleeding chores around the house. He has not noticed any trouble with constipation or excessive sleepiness with taking this medicine. Assessment Low back pain due to L4 vertebral compression fracture He is already on treatment with alendronate I think the benefit outweighs the risk to continue low-dose opioid pain medicine for this next one month all observing him for spontaneous healing inner to reduce the risk of deconditioning Plan Continue Fosamax weekly Continue back brace at all times while ambulatory Prescribed Norco 5 mg #30 tablets every 6 hours as needed for low back pain Follow-up with orthopedic surgery clinic in one month

## 2017-09-06 NOTE — Assessment & Plan Note (Addendum)
This seems to be mostly from venous insufficiency as he is without other evidence of heart failure or volume overload. He wears tight high socks but not actual compression stockings for this. He is having a good benefit with lasix 20mg  daily. He denies any trouble with lightheadedness or cramps. Assessment Chronic lower extremity edema with suspected venous insufficiency, improving on low dose diuresis Plan Continue lasix 20mg  daily Metabolic panel today

## 2017-09-09 NOTE — Progress Notes (Signed)
Completed.

## 2017-09-09 NOTE — Progress Notes (Signed)
Internal Medicine Clinic Attending  Case discussed with Dr. Rice at the time of the visit.  We reviewed the resident's history and exam and pertinent patient test results.  I agree with the assessment, diagnosis, and plan of care documented in the resident's note.  

## 2017-09-11 NOTE — Telephone Encounter (Signed)
Duplicate Request.

## 2017-09-27 DIAGNOSIS — M545 Low back pain: Secondary | ICD-10-CM | POA: Diagnosis not present

## 2017-10-03 ENCOUNTER — Other Ambulatory Visit: Payer: Self-pay | Admitting: Internal Medicine

## 2017-10-03 DIAGNOSIS — E785 Hyperlipidemia, unspecified: Secondary | ICD-10-CM

## 2017-10-03 MED ORDER — ATORVASTATIN CALCIUM 20 MG PO TABS: 20.0000 mg | ORAL_TABLET | Freq: Every day | ORAL | 3 refills | Status: DC

## 2017-10-08 DIAGNOSIS — K8031 Calculus of bile duct with cholangitis, unspecified, with obstruction: Secondary | ICD-10-CM

## 2017-10-08 HISTORY — DX: Calculus of bile duct with cholangitis, unspecified, with obstruction: K80.31

## 2017-10-14 ENCOUNTER — Other Ambulatory Visit (HOSPITAL_COMMUNITY): Payer: Medicare Other

## 2017-10-14 ENCOUNTER — Emergency Department (HOSPITAL_COMMUNITY): Payer: Medicare Other

## 2017-10-14 ENCOUNTER — Other Ambulatory Visit: Payer: Self-pay

## 2017-10-14 ENCOUNTER — Encounter (HOSPITAL_COMMUNITY): Payer: Self-pay

## 2017-10-14 ENCOUNTER — Inpatient Hospital Stay (HOSPITAL_COMMUNITY)
Admission: EM | Admit: 2017-10-14 | Discharge: 2017-10-22 | DRG: 871 | Disposition: A | Discharge status: Other Acute Inpatient Hospital | Payer: Medicare Other | Attending: Internal Medicine | Admitting: Internal Medicine

## 2017-10-14 DIAGNOSIS — Z7951 Long term (current) use of inhaled steroids: Secondary | ICD-10-CM | POA: Diagnosis not present

## 2017-10-14 DIAGNOSIS — E46 Unspecified protein-calorie malnutrition: Secondary | ICD-10-CM | POA: Diagnosis not present

## 2017-10-14 DIAGNOSIS — K831 Obstruction of bile duct: Secondary | ICD-10-CM | POA: Diagnosis not present

## 2017-10-14 DIAGNOSIS — J90 Pleural effusion, not elsewhere classified: Secondary | ICD-10-CM | POA: Diagnosis present

## 2017-10-14 DIAGNOSIS — K219 Gastro-esophageal reflux disease without esophagitis: Secondary | ICD-10-CM | POA: Diagnosis not present

## 2017-10-14 DIAGNOSIS — R17 Unspecified jaundice: Secondary | ICD-10-CM | POA: Diagnosis not present

## 2017-10-14 DIAGNOSIS — K7290 Hepatic failure, unspecified without coma: Secondary | ICD-10-CM | POA: Diagnosis present

## 2017-10-14 DIAGNOSIS — R7989 Other specified abnormal findings of blood chemistry: Secondary | ICD-10-CM

## 2017-10-14 DIAGNOSIS — F17210 Nicotine dependence, cigarettes, uncomplicated: Secondary | ICD-10-CM | POA: Diagnosis present

## 2017-10-14 DIAGNOSIS — E873 Alkalosis: Secondary | ICD-10-CM | POA: Diagnosis not present

## 2017-10-14 DIAGNOSIS — M549 Dorsalgia, unspecified: Secondary | ICD-10-CM | POA: Diagnosis not present

## 2017-10-14 DIAGNOSIS — J189 Pneumonia, unspecified organism: Secondary | ICD-10-CM | POA: Diagnosis not present

## 2017-10-14 DIAGNOSIS — R109 Unspecified abdominal pain: Secondary | ICD-10-CM | POA: Diagnosis present

## 2017-10-14 DIAGNOSIS — R0902 Hypoxemia: Secondary | ICD-10-CM | POA: Diagnosis not present

## 2017-10-14 DIAGNOSIS — E441 Mild protein-calorie malnutrition: Secondary | ICD-10-CM

## 2017-10-14 DIAGNOSIS — R1033 Periumbilical pain: Secondary | ICD-10-CM | POA: Diagnosis not present

## 2017-10-14 DIAGNOSIS — Z6827 Body mass index (BMI) 27.0-27.9, adult: Secondary | ICD-10-CM | POA: Diagnosis not present

## 2017-10-14 DIAGNOSIS — M810 Age-related osteoporosis without current pathological fracture: Secondary | ICD-10-CM | POA: Diagnosis present

## 2017-10-14 DIAGNOSIS — R000 Tachycardia, unspecified: Secondary | ICD-10-CM | POA: Diagnosis not present

## 2017-10-14 DIAGNOSIS — M545 Low back pain: Secondary | ICD-10-CM | POA: Diagnosis not present

## 2017-10-14 DIAGNOSIS — Z79899 Other long term (current) drug therapy: Secondary | ICD-10-CM

## 2017-10-14 DIAGNOSIS — K2970 Gastritis, unspecified, without bleeding: Secondary | ICD-10-CM | POA: Diagnosis not present

## 2017-10-14 DIAGNOSIS — E872 Acidosis: Secondary | ICD-10-CM | POA: Diagnosis not present

## 2017-10-14 DIAGNOSIS — J9690 Respiratory failure, unspecified, unspecified whether with hypoxia or hypercapnia: Secondary | ICD-10-CM

## 2017-10-14 DIAGNOSIS — Z0189 Encounter for other specified special examinations: Secondary | ICD-10-CM

## 2017-10-14 DIAGNOSIS — F419 Anxiety disorder, unspecified: Secondary | ICD-10-CM | POA: Diagnosis present

## 2017-10-14 DIAGNOSIS — R739 Hyperglycemia, unspecified: Secondary | ICD-10-CM | POA: Diagnosis not present

## 2017-10-14 DIAGNOSIS — B9620 Unspecified Escherichia coli [E. coli] as the cause of diseases classified elsewhere: Secondary | ICD-10-CM | POA: Diagnosis present

## 2017-10-14 DIAGNOSIS — R918 Other nonspecific abnormal finding of lung field: Secondary | ICD-10-CM | POA: Diagnosis not present

## 2017-10-14 DIAGNOSIS — K759 Inflammatory liver disease, unspecified: Secondary | ICD-10-CM | POA: Diagnosis not present

## 2017-10-14 DIAGNOSIS — D649 Anemia, unspecified: Secondary | ICD-10-CM | POA: Diagnosis present

## 2017-10-14 DIAGNOSIS — I10 Essential (primary) hypertension: Secondary | ICD-10-CM | POA: Diagnosis present

## 2017-10-14 DIAGNOSIS — K567 Ileus, unspecified: Secondary | ICD-10-CM | POA: Diagnosis not present

## 2017-10-14 DIAGNOSIS — B179 Acute viral hepatitis, unspecified: Secondary | ICD-10-CM | POA: Diagnosis not present

## 2017-10-14 DIAGNOSIS — R509 Fever, unspecified: Secondary | ICD-10-CM

## 2017-10-14 DIAGNOSIS — F329 Major depressive disorder, single episode, unspecified: Secondary | ICD-10-CM | POA: Diagnosis present

## 2017-10-14 DIAGNOSIS — E785 Hyperlipidemia, unspecified: Secondary | ICD-10-CM | POA: Diagnosis present

## 2017-10-14 DIAGNOSIS — J849 Interstitial pulmonary disease, unspecified: Secondary | ICD-10-CM

## 2017-10-14 DIAGNOSIS — Z87442 Personal history of urinary calculi: Secondary | ICD-10-CM | POA: Diagnosis not present

## 2017-10-14 DIAGNOSIS — R0600 Dyspnea, unspecified: Secondary | ICD-10-CM

## 2017-10-14 DIAGNOSIS — R05 Cough: Secondary | ICD-10-CM | POA: Diagnosis not present

## 2017-10-14 DIAGNOSIS — K7200 Acute and subacute hepatic failure without coma: Secondary | ICD-10-CM | POA: Diagnosis present

## 2017-10-14 DIAGNOSIS — E876 Hypokalemia: Secondary | ICD-10-CM | POA: Diagnosis present

## 2017-10-14 DIAGNOSIS — R945 Abnormal results of liver function studies: Secondary | ICD-10-CM | POA: Diagnosis not present

## 2017-10-14 DIAGNOSIS — R059 Cough, unspecified: Secondary | ICD-10-CM

## 2017-10-14 DIAGNOSIS — R0682 Tachypnea, not elsewhere classified: Secondary | ICD-10-CM | POA: Diagnosis not present

## 2017-10-14 DIAGNOSIS — J9601 Acute respiratory failure with hypoxia: Secondary | ICD-10-CM | POA: Diagnosis not present

## 2017-10-14 DIAGNOSIS — R1010 Upper abdominal pain, unspecified: Secondary | ICD-10-CM | POA: Diagnosis not present

## 2017-10-14 DIAGNOSIS — A419 Sepsis, unspecified organism: Principal | ICD-10-CM | POA: Diagnosis present

## 2017-10-14 DIAGNOSIS — R0603 Acute respiratory distress: Secondary | ICD-10-CM | POA: Diagnosis not present

## 2017-10-14 DIAGNOSIS — D696 Thrombocytopenia, unspecified: Secondary | ICD-10-CM | POA: Diagnosis present

## 2017-10-14 DIAGNOSIS — I4891 Unspecified atrial fibrillation: Secondary | ICD-10-CM | POA: Diagnosis not present

## 2017-10-14 DIAGNOSIS — R933 Abnormal findings on diagnostic imaging of other parts of digestive tract: Secondary | ICD-10-CM | POA: Diagnosis not present

## 2017-10-14 DIAGNOSIS — R0602 Shortness of breath: Secondary | ICD-10-CM | POA: Diagnosis not present

## 2017-10-14 DIAGNOSIS — G43909 Migraine, unspecified, not intractable, without status migrainosus: Secondary | ICD-10-CM | POA: Diagnosis present

## 2017-10-14 DIAGNOSIS — I493 Ventricular premature depolarization: Secondary | ICD-10-CM | POA: Diagnosis present

## 2017-10-14 DIAGNOSIS — K7010 Alcoholic hepatitis without ascites: Secondary | ICD-10-CM | POA: Diagnosis present

## 2017-10-14 DIAGNOSIS — G8929 Other chronic pain: Secondary | ICD-10-CM | POA: Diagnosis present

## 2017-10-14 DIAGNOSIS — R1084 Generalized abdominal pain: Secondary | ICD-10-CM | POA: Diagnosis not present

## 2017-10-14 DIAGNOSIS — Z9889 Other specified postprocedural states: Secondary | ICD-10-CM | POA: Diagnosis not present

## 2017-10-14 DIAGNOSIS — J449 Chronic obstructive pulmonary disease, unspecified: Secondary | ICD-10-CM | POA: Diagnosis not present

## 2017-10-14 DIAGNOSIS — A040 Enteropathogenic Escherichia coli infection: Secondary | ICD-10-CM | POA: Diagnosis present

## 2017-10-14 DIAGNOSIS — R112 Nausea with vomiting, unspecified: Secondary | ICD-10-CM | POA: Diagnosis not present

## 2017-10-14 DIAGNOSIS — R9389 Abnormal findings on diagnostic imaging of other specified body structures: Secondary | ICD-10-CM | POA: Diagnosis not present

## 2017-10-14 DIAGNOSIS — E440 Moderate protein-calorie malnutrition: Secondary | ICD-10-CM | POA: Diagnosis not present

## 2017-10-14 DIAGNOSIS — R1115 Cyclical vomiting syndrome unrelated to migraine: Secondary | ICD-10-CM | POA: Insufficient documentation

## 2017-10-14 DIAGNOSIS — Z7982 Long term (current) use of aspirin: Secondary | ICD-10-CM

## 2017-10-14 DIAGNOSIS — E669 Obesity, unspecified: Secondary | ICD-10-CM | POA: Diagnosis not present

## 2017-10-14 DIAGNOSIS — R140 Abdominal distension (gaseous): Secondary | ICD-10-CM

## 2017-10-14 DIAGNOSIS — R748 Abnormal levels of other serum enzymes: Secondary | ICD-10-CM | POA: Diagnosis not present

## 2017-10-14 DIAGNOSIS — Z791 Long term (current) use of non-steroidal anti-inflammatories (NSAID): Secondary | ICD-10-CM

## 2017-10-14 DIAGNOSIS — S3991XA Unspecified injury of abdomen, initial encounter: Secondary | ICD-10-CM | POA: Diagnosis not present

## 2017-10-14 DIAGNOSIS — K3184 Gastroparesis: Secondary | ICD-10-CM | POA: Diagnosis present

## 2017-10-14 DIAGNOSIS — K56600 Partial intestinal obstruction, unspecified as to cause: Secondary | ICD-10-CM | POA: Diagnosis not present

## 2017-10-14 DIAGNOSIS — D72829 Elevated white blood cell count, unspecified: Secondary | ICD-10-CM | POA: Diagnosis not present

## 2017-10-14 DIAGNOSIS — J690 Pneumonitis due to inhalation of food and vomit: Secondary | ICD-10-CM | POA: Diagnosis present

## 2017-10-14 DIAGNOSIS — R590 Localized enlarged lymph nodes: Secondary | ICD-10-CM | POA: Diagnosis present

## 2017-10-14 DIAGNOSIS — J439 Emphysema, unspecified: Secondary | ICD-10-CM | POA: Diagnosis not present

## 2017-10-14 DIAGNOSIS — K56609 Unspecified intestinal obstruction, unspecified as to partial versus complete obstruction: Secondary | ICD-10-CM | POA: Diagnosis not present

## 2017-10-14 DIAGNOSIS — K5900 Constipation, unspecified: Secondary | ICD-10-CM | POA: Diagnosis not present

## 2017-10-14 DIAGNOSIS — R1110 Vomiting, unspecified: Secondary | ICD-10-CM | POA: Diagnosis not present

## 2017-10-14 DIAGNOSIS — F1021 Alcohol dependence, in remission: Secondary | ICD-10-CM | POA: Diagnosis present

## 2017-10-14 DIAGNOSIS — R079 Chest pain, unspecified: Secondary | ICD-10-CM | POA: Diagnosis not present

## 2017-10-14 DIAGNOSIS — R10819 Abdominal tenderness, unspecified site: Secondary | ICD-10-CM | POA: Diagnosis not present

## 2017-10-14 HISTORY — DX: Migraine, unspecified, not intractable, without status migrainosus: G43.909

## 2017-10-14 LAB — COMPREHENSIVE METABOLIC PANEL
ALT: 241 U/L — AB (ref 17–63)
AST: 469 U/L — AB (ref 15–41)
Albumin: 3.9 g/dL (ref 3.5–5.0)
Alkaline Phosphatase: 205 U/L — ABNORMAL HIGH (ref 38–126)
Anion gap: 11 (ref 5–15)
BUN: 9 mg/dL (ref 6–20)
CO2: 24 mmol/L (ref 22–32)
CREATININE: 1.08 mg/dL (ref 0.61–1.24)
Calcium: 9.2 mg/dL (ref 8.9–10.3)
Chloride: 102 mmol/L (ref 101–111)
GFR calc Af Amer: >60 mL/min (ref >60)
GLUCOSE: 154 mg/dL — AB (ref 65–99)
Potassium: 3.9 mmol/L (ref 3.5–5.1)
Sodium: 137 mmol/L (ref 135–145)
Total Bilirubin: 3.9 mg/dL — ABNORMAL HIGH (ref 0.3–1.2)
Total Protein: 7.3 g/dL (ref 6.5–8.1)

## 2017-10-14 LAB — CBC
HCT: 42.5 % (ref 39.0–52.0)
Hemoglobin: 14.3 g/dL (ref 13.0–17.0)
MCH: 31.9 pg (ref 26.0–34.0)
MCHC: 33.6 g/dL (ref 30.0–36.0)
MCV: 94.9 fL (ref 78.0–100.0)
PLATELETS: 173 10*3/uL (ref 150–400)
RBC: 4.48 MIL/uL (ref 4.22–5.81)
RDW: 12.6 % (ref 11.5–15.5)
WBC: 11.7 10*3/uL — AB (ref 4.0–10.5)

## 2017-10-14 LAB — URINALYSIS, ROUTINE W REFLEX MICROSCOPIC
Bilirubin Urine: NEGATIVE
GLUCOSE, UA: NEGATIVE mg/dL
Hgb urine dipstick: NEGATIVE
KETONES UR: 5 mg/dL — AB
Leukocytes, UA: NEGATIVE
Nitrite: NEGATIVE
PROTEIN: NEGATIVE mg/dL
Specific Gravity, Urine: 1.016 (ref 1.005–1.030)
pH: 7 (ref 5.0–8.0)

## 2017-10-14 LAB — LIPASE, BLOOD: LIPASE: 23 U/L (ref 11–51)

## 2017-10-14 MED ORDER — PANTOPRAZOLE SODIUM 40 MG PO TBEC
40.0000 mg | DELAYED_RELEASE_TABLET | Freq: Two times a day (BID) | ORAL | Status: DC
Start: 2017-10-15 — End: 2017-10-16
  Administered 2017-10-15 – 2017-10-16 (×3): 40 mg via ORAL
  Filled 2017-10-14 (×3): qty 1

## 2017-10-14 MED ORDER — ONDANSETRON HCL 4 MG/2ML IJ SOLN
4.0000 mg | Freq: Once | INTRAMUSCULAR | Status: AC
  Administered 2017-10-14: 4 mg via INTRAVENOUS
  Filled 2017-10-14: qty 2

## 2017-10-14 MED ORDER — PANTOPRAZOLE SODIUM 40 MG IV SOLR
40.0000 mg | Freq: Two times a day (BID) | INTRAVENOUS | Status: AC
  Administered 2017-10-14 (×2): 40 mg via INTRAVENOUS
  Filled 2017-10-14 (×2): qty 40

## 2017-10-14 MED ORDER — SODIUM CHLORIDE 0.9 % IV BOLUS (SEPSIS)
1000.0000 mL | Freq: Once | INTRAVENOUS | Status: AC
  Administered 2017-10-14: 1000 mL via INTRAVENOUS

## 2017-10-14 MED ORDER — ALBUTEROL SULFATE (2.5 MG/3ML) 0.083% IN NEBU: 2.5000 mg | INHALATION_SOLUTION | RESPIRATORY_TRACT | Status: DC | PRN

## 2017-10-14 MED ORDER — HYDROMORPHONE HCL 1 MG/ML IJ SOLN
1.0000 mg | INTRAMUSCULAR | Status: DC | PRN
  Administered 2017-10-14 – 2017-10-15 (×5): 1 mg via INTRAVENOUS
  Filled 2017-10-14 (×5): qty 1

## 2017-10-14 MED ORDER — HYDROMORPHONE HCL 1 MG/ML IJ SOLN
1.0000 mg | Freq: Once | INTRAMUSCULAR | Status: AC
  Administered 2017-10-14: 1 mg via INTRAVENOUS
  Filled 2017-10-14: qty 1

## 2017-10-14 MED ORDER — SODIUM CHLORIDE 0.9% FLUSH
3.0000 mL | Freq: Two times a day (BID) | INTRAVENOUS | Status: DC
  Administered 2017-10-14 – 2017-10-22 (×12): 3 mL via INTRAVENOUS

## 2017-10-14 MED ORDER — IOPAMIDOL (ISOVUE-300) INJECTION 61%
INTRAVENOUS | Status: AC
  Administered 2017-10-14: 100 mL
  Filled 2017-10-14: qty 100

## 2017-10-14 MED ORDER — ONDANSETRON HCL 4 MG/2ML IJ SOLN
4.0000 mg | Freq: Three times a day (TID) | INTRAMUSCULAR | Status: DC | PRN
  Administered 2017-10-14 – 2017-10-16 (×3): 4 mg via INTRAVENOUS
  Filled 2017-10-14 (×3): qty 2

## 2017-10-14 MED ORDER — SODIUM CHLORIDE 0.9% FLUSH: 3.0000 mL | INTRAVENOUS | Status: DC | PRN

## 2017-10-14 MED ORDER — SODIUM CHLORIDE 0.9 % IV SOLN: 250.0000 mL | INTRAVENOUS | Status: DC | PRN

## 2017-10-14 MED ORDER — TIOTROPIUM BROMIDE MONOHYDRATE 18 MCG IN CAPS
18.0000 ug | ORAL_CAPSULE | Freq: Every day | RESPIRATORY_TRACT | Status: DC
Start: 2017-10-14 — End: 2017-10-20
  Administered 2017-10-15 – 2017-10-20 (×6): 18 ug via RESPIRATORY_TRACT
  Filled 2017-10-14 (×2): qty 5

## 2017-10-14 MED ORDER — GABAPENTIN 300 MG PO CAPS
300.0000 mg | ORAL_CAPSULE | Freq: Two times a day (BID) | ORAL | Status: DC
  Administered 2017-10-14 – 2017-10-22 (×17): 300 mg via ORAL
  Filled 2017-10-14 (×17): qty 1

## 2017-10-14 MED ORDER — SODIUM CHLORIDE 0.9 % IV SOLN
1000.0000 mL | INTRAVENOUS | Status: DC
  Administered 2017-10-14 – 2017-10-15 (×3): 1000 mL via INTRAVENOUS

## 2017-10-14 MED ORDER — ENOXAPARIN SODIUM 40 MG/0.4ML ~~LOC~~ SOLN
40.0000 mg | SUBCUTANEOUS | Status: DC
  Administered 2017-10-14 – 2017-10-15 (×2): 40 mg via SUBCUTANEOUS
  Filled 2017-10-14 (×2): qty 0.4

## 2017-10-14 MED ORDER — PIPERACILLIN-TAZOBACTAM 3.375 G IVPB 30 MIN
3.3750 g | Freq: Once | INTRAVENOUS | Status: AC
  Administered 2017-10-14: 3.375 g via INTRAVENOUS
  Filled 2017-10-14: qty 50

## 2017-10-14 NOTE — ED Provider Notes (Signed)
Patient presented to the emergency room with complaints of nausea vomiting and abdominal pain.  He was seen by my colleague, Dr. Venora Maples.  Please see his note.  Patient is noted to have elevated LFTs.  He continues to have pain.  His symptoms are concerning for choledocholithiasis.  Ultrasound is pending Physical Exam  BP (!) 115/103   Pulse (!) 107   Temp 98.3 F (36.8 C) (Oral)   Resp 20   Ht 1.702 m (5\' 7" )   Wt 78 kg (172 lb)   SpO2 93%   BMI 26.94 kg/m   Physical Exam  ED Course/Procedures   Clinical Course as of Oct 14 1504  Mon Oct 14, 2017  0831 Pt still having pain.  Will order an additional dose of zofran and dilaudid  [JK]  0836 Pt states he has seen a [facility] doctor in the past  [JK]  1055 D/w Azucena Freed.  They will consult on patient.  [JK]    Clinical Course User Index [JK] Dorie Rank, MD    Procedures  MDM  Pt presented with abdominal pain, elevated LFTs.  No biliary ductal dilitation noted on [place] or CT.   With his persistent pain, admitted for further gi workup.       Dorie Rank, MD 10/14/17 [phone]

## 2017-10-14 NOTE — H&P (Signed)
Date: 10/14/2017               Patient Name:  [name] MRN: [id number]  DOB: 1946-08-15 Age / Sex: 72 y.o., male   PCP: Lorella Nimrod, MD         Medical Service: Internal Medicine Teaching Service         Attending Physician: Dr. Rebeca Alert Raynaldo Opitz, MD    First Contact: Dr. Tarri Abernethy Pager: [phone]  Second Contact: Dr. Danford Bad Pager: [phone]       After Hours (After 5p/  First Contact Pager: [phone]  weekends / holidays): Second Contact Pager: [phone]   Chief Complaint: Abdominal Pain  History of Present Illness: [name] is 72 year old male with HTN, tobacco use, and chronic lumbar back pain who presented with acute onset diffuse abdominal pain starting last night. He states that he had dinner which included cubed steak, macaroni casserole, and broccoli then approximately 30 minutes later he began to experience diffuse abdominal pain. He states that he has never had any pain like this before and describes it as intermittent sharp/crampy feeling. It is difficult to say whether it radiates to his back or not as he has chronic back pain. He has also experienced multiple episodes of emesis. These episodes have been nonbloody, but he does say they are pink tinged. None of the medications that he tried at home helped to relieve the pain. He has not had a bowel movement in 1 day, which is abnormal for him.  He denies any flatulence in the past several hours. He does use several NSAIDs include 3-4 packets of Goodie powder per day, meloxicam, and aspirin. He does drink malt liquor occasionally and smokes cigarettes. He denies a history of abdominal surgeries. He denies any history of illicit drug use or IV drugs.   Otherwise feels well and denies fevers, chills, nasal congestion, rhinorrhea, headaches, cough, SOB, chest pain, palpitations. He does endorse claudication symptoms with ambulation.   Meds:  Current Meds  Medication Sig  . acetaminophen (TYLENOL) 325 MG tablet Take 2  tablets (650 mg total) by mouth every 6 (six) hours as needed for mild pain or moderate pain.  Marland Kitchen alendronate (FOSAMAX) 70 MG tablet Take 1 tablet (70 mg total) by mouth every 7 (seven) days. Take with a full glass of water on an empty stomach.  Marland Kitchen aspirin EC 81 MG tablet Take 1 tablet (81 mg total) by mouth daily.  Marland Kitchen atorvastatin (LIPITOR) 20 MG tablet Take 1 tablet (20 mg total) by mouth daily.  . Diclofenac Sodium 3 % GEL Place 1 application onto the skin 2 (two) times daily. To affected area.  . furosemide (LASIX) 20 MG tablet Take 1 tablet (20 mg total) by mouth daily. For leg swelling.  . gabapentin (NEURONTIN) 300 MG capsule Take 1 capsule (300 mg total) by mouth 2 (two) times daily.  Marland Kitchen HYDROcodone-acetaminophen (NORCO) 5-325 MG tablet Take 1 tablet by mouth every 6 (six) hours as needed for moderate pain.  . meloxicam (MOBIC) 7.5 MG tablet Take 7.5 mg by mouth daily.  . Multiple Vitamins-Minerals (CENTRUM SILVER ADULT 50+) TABS Take 1 tablet by mouth daily.  Marland Kitchen PROAIR HFA 108 (90 Base) MCG/ACT inhaler INHALE 2 PUFFS INTO THE LUNGS FOUR TIMES DAILY (Patient taking differently: INHALE 2 PUFFS INTO THE LUNGS FOUR TIMES DAILY AS NEEDED FOR SOB.)  . tiotropium (SPIRIVA) 18 MCG inhalation capsule Place 1 capsule (18 mcg total) into inhaler and inhale daily.  Marland Kitchen tiZANidine (  ZANAFLEX) 2 MG tablet Take 2 mg by mouth every 6 (six) hours as needed for muscle spasms.   Allergies: Allergies as of 10/14/2017  . (No Known Allergies)   Past Medical History:  Diagnosis Date  . Anxiety and depression   . COPD (chronic obstructive pulmonary disease) ([place])   . GERD (gastroesophageal reflux disease)   . History of migraine headaches   . HTN (hypertension)   . Hyperlipemia   . Personal history of alcoholism ([place])   . Tobacco use   . 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   Physical Exam: Blood pressure 106/75, pulse (!) 115, temperature 98.3 F (36.8 C), temperature source Oral, resp. rate 17, height 5\' 7"  (1.702 m), weight 172 lb (78 kg), SpO2 95 %.  General: Elderly male in acute distress HENT: Normocephalic, atraumatic, xanthomas on the forehead, moist mucus membranes  Pulm: Good air movement with no wheezing or crackles  CV: tachycardic with regular rhythm, no murmurs, no rubs  Abdomen: Absent bowel sounds, distended, diffuse tenderness to palpation with rebound and guarding  Extremities: No LE edema, pulses palpable  Skin: Warm and dry  Neuro: Alert and oriented x3  EKG: personally reviewed: my interpretation is high background and difficult to assess whether or not he is in sinus, regular rhythm, normal axis, no ST elevation.  Abdominal Plain Film  No free air.  No evidence of bowel obstruction.  CT Abdomen  1.  No acute process in the abdomen or pelvis. 2.  Aortic Atherosclerosis (ICD10-I70.0). 3. Chronic T12 and new L4 compression deformities. 4. Left nephrolithiasis.  Assessment & Plan by Problem: Active Problems:   Abdominal pain  Abdominal Pain. [name] is 72 year old male with HTN, tobacco use, and chronic lumbar back pain who presented with acute onset diffuse abdominal pain starting last night. CMP significant for post hepatic injury (cholestatic liver injury). The differential for these lab abnormalities include alcoholic hepatitis, gallbladder dysfunction, or bile duct injury. The patient denies excessive alcohol use making alcoholic hepatitis less likely. There were no gallstones noted on CT abdomen or right upper quadrant ultrasound; however, the patient's gallbladder was contracted and not well visualized. It is possible that this patient is experiencing acalculous cholecystitis or choledocholithiasis  with passage of the stone. However, choledocholithiasis with passage of the stone is less likely given that the CT abdomen and right upper quadrant ultrasound did not show any biliary duct dilation or gallbladder wall thickening. Also on the differential is PUD versus gastritis as the patient uses excessive amounts of NSAIDs. No free air was noted on the abdominal plain film or CT abdomen making perforated viscus less likely. Pancreatitis unlikely given normal Lipase and no CT findings. Gastroenterology has been consulted. - Admit to Med-surg  - Started on BID PPI therapy  - GI planning for EGD tomorrow   - Viral hepatitis panel  - Pain management with IV Dilaudid  - PRN medications for nausea   Chronic Lumbar Back Pain  - Continuing Gabapentin  - Holding Meloxicam, Norco, and Tizanidine   Migraine Headaches  - Patient uses excessive amounts of NSAIDs for both his migraines and chronic back pain - Will consider prophylaxis with a beta blocker   COPD -  Continue Spiriva with albuterol PRN  Hyperlipidemia  - Hold Atorvastatin but will resume on discharge  - Checking lipid panel   Diet: NPO VTE ppx: Lovenox  Code Status: Full Code  Dispo: Admit patient to Inpatient with expected length of stay greater than 2 midnights.  SignedIna Homes, MD 10/14/2017, 12:27 PM  My Pager: [phone]

## 2017-10-14 NOTE — ED Provider Notes (Signed)
[facility] EMERGENCY DEPARTMENT Provider Note   CSN: [id number] Arrival date & time: 10/14/17  6440     History   Chief Complaint Chief Complaint  Patient presents with  . Abdominal Pain    HPI [name] is a 72 y.o. male.  HPI 72 year old male presents the emergency department with diffuse pain across his abdomen with associated nausea vomiting.  He states his pain began after dinner last night is been constant and worsening.  His pain is severe in severity at this time.  Denies new back pain.  No chest pain or shortness of breath.  Denies fevers and chills.  Denies blood in his vomit.  He was given 100 mcg of fentanyl and 4 mg of Zofran by EMS without improvement in his symptoms.   Past Medical History:  Diagnosis Date  . Anxiety and depression   . COPD (chronic obstructive pulmonary disease) ([place])   . GERD (gastroesophageal reflux disease)   . History of fall   . History of migraine headaches   . HTN (hypertension)   . Hyperlipemia   . Personal history of alcoholism ([place])   . Tobacco use   . Tubular adenoma 01/29/2008    Patient Active Problem List   Diagnosis Date Noted  . Acute low back pain 06/27/2017  . Osteoporosis 06/27/2017  . Trigger little finger of left hand 04/12/2017  . Abnormal CT of liver 08/13/2016  . Leg cramps 08/13/2016  . Stable angina ([place]) 08/13/2016  . OSA (obstructive sleep apnea) 08/23/2014  . Hx of migraines 05/26/2014  . Back pain, chronic 05/26/2014  . History of colonic polyps 12/29/2012  . Hyperlipemia 11/12/2012  . COPD (chronic obstructive pulmonary disease) ([place]) 11/12/2012  . Leg swelling 11/12/2012  . HTN (hypertension) 11/12/2012  . Tobacco use 11/12/2012  . Healthcare maintenance 11/12/2012    Past Surgical History:  Procedure Laterality Date  . ANTERIOR FUSION CERVICAL SPINE  2005   two surgeries after two mechanical falls, per Dr. Donald Pore  . COLONOSCOPY W/ BIOPSIES  01/29/2008   Dr. Silvano Rusk  . [place]   correction for crossed-eyes  . [place] Medications    Prior to Admission medications   Medication Sig Start Date End Date Taking? Authorizing Provider  acetaminophen (TYLENOL) 325 MG tablet Take 2 tablets (650 mg total) by mouth every 6 (six) hours as needed for mild pain or moderate pain. 06/25/17  Yes Waynetta Pean, PA-C  alendronate (FOSAMAX) 70 MG tablet Take 1 tablet (70 mg total) by mouth every 7 (seven) days. Take with a full glass of water on an empty stomach. 06/27/17 06/27/18 Yes Alphonzo Grieve, MD  aspirin EC 81 MG tablet Take 1 tablet (81 mg total) by mouth daily. 04/12/17 04/12/18 Yes Lorella Nimrod, MD  atorvastatin (LIPITOR) 20 MG tablet Take 1 tablet (20 mg total) by mouth daily. 10/03/17  Yes Lorella Nimrod, MD  Diclofenac Sodium 3 % GEL Place 1 application onto the skin 2 (two) times daily. To affected area. 06/25/17  Yes Waynetta Pean, PA-C  furosemide (LASIX) 20 MG tablet Take 1 tablet (20 mg total) by mouth daily. For leg swelling. 08/02/17 08/02/18 Yes Lorella Nimrod, MD  gabapentin (NEURONTIN) 300 MG capsule Take 1 capsule (300 mg total) by mouth 2 (two) times daily. 07/12/17  Yes Lorella Nimrod, MD  HYDROcodone-acetaminophen (NORCO) 5-325 MG tablet Take 1 tablet by mouth every 6 (six) hours as needed  for moderate pain. 09/04/17  Yes Rice, Resa Miner, MD  meloxicam (MOBIC) 7.5 MG tablet Take 7.5 mg by mouth daily.   Yes [provider]  Multiple Vitamins-Minerals (CENTRUM SILVER ADULT 50+) TABS Take 1 tablet by mouth daily.   Yes [provider]  PROAIR HFA 108 (90 Base) MCG/ACT inhaler INHALE 2 PUFFS INTO THE LUNGS FOUR TIMES DAILY Patient taking differently: INHALE 2 PUFFS INTO THE LUNGS FOUR TIMES DAILY AS NEEDED FOR SOB. 07/22/17  Yes Lorella Nimrod, MD  tiotropium (SPIRIVA) 18 MCG inhalation capsule Place 1 capsule (18 mcg total) into inhaler and inhale daily. 02/20/17  Yes Lorella Nimrod, MD    tiZANidine (ZANAFLEX) 2 MG tablet Take 2 mg by mouth every 6 (six) hours as needed for muscle spasms.   Yes [provider]    Family History Family History  Problem Relation Age of Onset  . Heart attack Mother   . Heart attack Father   . Stroke Sister   . Heart attack Brother   . Heart attack Sister     Social History Social History   Tobacco Use  . Smoking status: Current Every Day Smoker    Packs/day: 0.50    Years: 55.00    Pack years: 27.50    Types: Cigarettes  . Smokeless tobacco: Never Used  Substance Use Topics  . Alcohol use: Yes    Alcohol/week: 0.0 oz    Comment: Beer-rarely.  . Drug use: No     Allergies   Patient has no known allergies.   Review of Systems Review of Systems  All other systems reviewed and are negative.    Physical Exam Updated Vital Signs BP (!) 147/89   Pulse (!) 120   Temp 98.3 F (36.8 C) (Oral)   Resp (!) 25   Ht 5\' 7"  (1.702 m)   Wt 78 kg (172 lb)   SpO2 93%   BMI 26.94 kg/m   Physical Exam  Constitutional: He is oriented to person, place, and time. He appears well-developed and well-nourished.  Uncomfortable appearing  HENT:  Head: Normocephalic and atraumatic.  Eyes: EOM are normal.  Neck: Normal range of motion.  Cardiovascular: Normal rate and regular rhythm.  Pulmonary/Chest: Effort normal and breath sounds normal. No respiratory distress.  Abdominal: He exhibits no distension.  Upper abdominal and periumbilical tenderness with some guarding.  No rebound.  Musculoskeletal: Normal range of motion.  Neurological: He is alert and oriented to person, place, and time.  Skin: Skin is warm and dry.  Psychiatric: He has a normal mood and affect. Judgment normal.  Nursing note and vitals reviewed.    ED Treatments / Results  Labs (all labs ordered are listed, but only abnormal results are displayed) Labs Reviewed  COMPREHENSIVE METABOLIC PANEL - Abnormal; Notable for the following components:       Result Value   Glucose, Bld 154 (*)    AST 469 (*)    ALT 241 (*)    Alkaline Phosphatase 205 (*)    Total Bilirubin 3.9 (*)    All other components within normal limits  CBC - Abnormal; Notable for the following components:   WBC 11.7 (*)    All other components within normal limits  LIPASE, BLOOD  URINALYSIS, ROUTINE W REFLEX MICROSCOPIC    EKG  EKG Interpretation None       Radiology Dg Abd Portable 1 View  Result Date: 10/14/2017 CLINICAL DATA:  Abdominal pain and vomiting. EXAM: PORTABLE ABDOMEN - 1  VIEW COMPARISON:  None. FINDINGS: Upright views of the abdomen demonstrate no evidence of free air. Air-filled normal caliber small bowel in the right upper quadrant. Small stool burden in the upper abdomen. Subsegmental atelectasis at the left lung base. IMPRESSION: No free air.  No evidence of bowel obstruction. Electronically Signed   By: Jeb Levering M.D.   On: 10/14/2017 06:46    Procedures Procedures (including critical care time)  Medications Ordered in ED Medications  HYDROmorphone (DILAUDID) injection 1 mg (1 mg Intravenous Given 10/14/17 0637)  ondansetron (ZOFRAN) injection 4 mg (4 mg Intravenous Given 10/14/17 0637)  iopamidol (ISOVUE-300) 61 % injection (100 mLs  Contrast Given 10/14/17 0659)     Initial Impression / Assessment and Plan / ED Course  I have reviewed the triage vital signs and the nursing notes.  Pertinent labs & imaging results that were available during my care of the patient were reviewed by me and considered in my medical decision making (see chart for details).     Patient having significant pain in the emergency department.  Pain will be treated at this time.  Patient did undergo ultrasound of his aorta in 2014 which demonstrated no evidence of AAA at that time.  He denies back pain.  No chest pain or shortness of breath.  His pain is severe in severity.  Pain medication now.  Nausea medication now.  Patient will undergo CT abdomen LFTs  noted to be abnormal.  This may represent choledocholithiasis versus acute cholecystitis.  Final Clinical Impressions(s) / ED Diagnoses   Final diagnoses:  None    ED Discharge Orders    None       Jola Schmidt, MD 10/14/17 [phone]

## 2017-10-14 NOTE — ED Notes (Signed)
Contact Information (friend) Art [phone]

## 2017-10-14 NOTE — ED Triage Notes (Signed)
Pt via EMS from home for abdominal pain. Pt. Describes it as a cramping pain. Abd. Pain started last night after dinner. Pt. voomited approximately 8 times in past 24 hrs. Given 4 mg zofran and 100 mcg fentanyl with no relief. Pt. Very tender in upper quadrants.

## 2017-10-14 NOTE — Consult Note (Signed)
                                                                           [name] Consult: 11:05 AM 10/14/2017  LOS: 0 days    Referring Provider: Dr Knapp in ED  Primary Care Physician:  Amin, Sumayya, MD of [facility].   Primary Gastroenterologist:  Dr. Gessner.       Reason for Consultation:  abd pain, N/V and elevated LFTs.     HPI: [name] is a 71 y.o. male.  PMH COPD. Deg spinal dz.  Osteoporosis.   01/2008 Colonoscopy for hematochezia, change in bowel habits: 10 mm ascending polyp (TA), 6 mm rectal (TA and HP type) polyps.  Small int rrhoids.     Presented to ED 5 AM with upper abd pain beginning after evening meal.  No radiation into his back.  Multiple episodes of pink tinged emesis.   Pain is "12/10", emesis pink tinged.  Began 20 min after meal of cube steak, macaroni salad, broccoli.  No prior sxs or GI troubles.  Stools brown, mostly daily.  Took a laxative, thinking it might help: had no BM and no relief of sxs.     T bili 3.9.  Alk phos 205.  AST/ALT 469/241.  Lipase 23.  LFTs normal in 2016 and 2017.  WBCs 11.7.   CT ab/pelvis with contrast:  Liver normal.  Contracted GB without stones.  Biliary and pancreatic ducts wnl.  Atrophic pancreas.  Normal stomach and intestine.  New L4 and old T12 compression fx.  Left kidney stone.   Abd ultrasound:  Gallbladder contracted, not well visualized. No visible stones or wall thickening. No biliary duct dilatation.  Potentially GI disruptive meds include Fosamax weekly, 81 ASA, Mobic, hydrocodone.  Takes up to 10 ASA powders daily (range is 4 to 10), takes three 325 ASA daily plus the 81 Mg ASA, Mobic, hydrocodone.No PPI, h2 blocker.    Drinks 60 to 80 oz of a 6% alcohol canned "power drink" on Sat and sundays.  Less ETOH during week.      Past Medical History:  Diagnosis Date  . Anxiety and depression    . COPD (chronic obstructive pulmonary disease) (HCC)   . GERD (gastroesophageal reflux disease)   . History of migraine headaches   . HTN (hypertension)   . Hyperlipemia   . Personal history of alcoholism (HCC)   . Tobacco use   . Tubular adenoma 01/29/2008    Past Surgical History:  Procedure Laterality Date  . ANTERIOR FUSION CERVICAL SPINE  2005   two surgeries after two mechanical falls, per Dr. Sterns  . COLONOSCOPY W/ BIOPSIES  01/29/2008   Dr. Carl Gessner  . EYE SURGERY  1954   correction for crossed-eyes  . HEMORRHOID SURGERY  1971    Prior to Admission medications   Medication Sig Start Date End Date Taking? Authorizing Provider  acetaminophen (TYLENOL) 325 MG tablet Take 2 tablets (650 mg total) by mouth every 6 (six) hours as needed for mild pain or moderate pain. 06/25/17  Yes Dansie, William, PA-C  alendronate (FOSAMAX) 70 MG tablet Take 1 tablet (70 mg total) by mouth every 7 (seven)   days. Take with a full glass of water on an empty stomach. 06/27/17 06/27/18 Yes Svalina, Gorica, MD  aspirin EC 81 MG tablet Take 1 tablet (81 mg total) by mouth daily. 04/12/17 04/12/18 Yes Amin, Sumayya, MD  atorvastatin (LIPITOR) 20 MG tablet Take 1 tablet (20 mg total) by mouth daily. 10/03/17  Yes Amin, Sumayya, MD  Diclofenac Sodium 3 % GEL Place 1 application onto the skin 2 (two) times daily. To affected area. 06/25/17  Yes Dansie, William, PA-C  furosemide (LASIX) 20 MG tablet Take 1 tablet (20 mg total) by mouth daily. For leg swelling. 08/02/17 08/02/18 Yes Amin, Sumayya, MD  gabapentin (NEURONTIN) 300 MG capsule Take 1 capsule (300 mg total) by mouth 2 (two) times daily. 07/12/17  Yes Amin, Sumayya, MD  HYDROcodone-acetaminophen (NORCO) 5-325 MG tablet Take 1 tablet by mouth every 6 (six) hours as needed for moderate pain. 09/04/17  Yes Rice, Christopher W, MD  meloxicam (MOBIC) 7.5 MG tablet Take 7.5 mg by mouth daily.   Yes [provider]  Multiple Vitamins-Minerals  (CENTRUM SILVER ADULT 50+) TABS Take 1 tablet by mouth daily.   Yes [provider]  PROAIR HFA 108 (90 Base) MCG/ACT inhaler INHALE 2 PUFFS INTO THE LUNGS FOUR TIMES DAILY Patient taking differently: INHALE 2 PUFFS INTO THE LUNGS FOUR TIMES DAILY AS NEEDED FOR SOB. 07/22/17  Yes Amin, Sumayya, MD  tiotropium (SPIRIVA) 18 MCG inhalation capsule Place 1 capsule (18 mcg total) into inhaler and inhale daily. 02/20/17  Yes Amin, Sumayya, MD  tiZANidine (ZANAFLEX) 2 MG tablet Take 2 mg by mouth every 6 (six) hours as needed for muscle spasms.   Yes [provider]    Scheduled Meds:  Infusions:  PRN Meds:    Allergies as of 10/14/2017  . (No Known Allergies)    Family History  Problem Relation Age of Onset  . Heart attack Mother   . Heart attack Father   . Stroke Sister   . Heart attack Brother   . Heart attack Sister     Social History   Socioeconomic History  . Marital status: Divorced    Spouse name: Not on file  . Number of children: Not on file  . Years of education: Not on file  . Highest education level: Not on file  Social Needs  . Financial resource strain: Not on file  . Food insecurity - worry: Not on file  . Food insecurity - inability: Not on file  . Transportation needs - medical: Not on file  . Transportation needs - non-medical: Not on file  Occupational History  . Occupation: On disability   . 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 Occupation: Environmental Services    Employer: [employer]    Comment: years ago  Tobacco Use  . Smoking status: Current Every Day Smoker    Packs/day: 0.50    Years: 55.00    Pack years: 27.50    Types: Cigarettes  . Smokeless tobacco: Never Used  Substance and Sexual Activity  . Alcohol use: Yes    Alcohol/week: 0.0 oz    Comment: Beer-rarely.  . Drug use: No  . Sexual activity: Not on file  Other Topics Concern  . Not on file  Social History  Narrative   His son died in a car accident in 1994. His granddaughter died in a house fire in 2011.    He lives by himself in a   boarding house. He prepares his own meals, he is able to go to the pharmacy and grocery store. He uses public transportation. He uses a cane at his baseline and is independent of ADLs. Most of his family live in [place], [place], or [place]--brother, nieces and nefews. He has a sister in [place], [place] but she is disabled. He attends [employer] in [place] and his Pastor there, Milton Farmer, calls him almost every other day and checks on him 3-4 times per month.     REVIEW OF SYSTEMS: Constitutional:  Feels tired weak now, not generally ENT:  No nose bleeds Pulm:  No SOB or cough CV:  No palpitations, no LE edema.  GU:  No hematuria, no frequency GI:  Per HPI.  No dysphagia.   Heme:  No unusual bleeding or bruising   Transfusions:  None.   Neuro:  No headaches, no peripheral tingling or numbness Derm:  No itching, no rash or sores.  Endocrine:  No sweats or chills.  No polyuria or dysuria Immunization:  Reviewed.  Flu shot up to date.   Travel:  None beyond local counties in last few months.    PHYSICAL EXAM: Vital signs in last 24 hours: Vitals:   10/14/17 0900 10/14/17 0930  BP: 124/78 117/77  Pulse: (!) 130 (!) 123  Resp: (!) 21 14  Temp:    SpO2: 95% 95%   Wt Readings from Last 3 Encounters:  10/14/17 78 kg (172 lb)  09/04/17 80.1 kg (176 lb 8 oz)  08/02/17 79.5 kg (175 lb 3.2 oz)    General: elderly WM in some distress, comfortable currently sitting up in stretcher.   Head:  No asymmetry or edema.  No head trauma  Eyes:  No icterus or conj pallor Ears:  Slightly HOH  Nose:  No congestion or discharge. Mouth:  Moist, pink and clear oral mm.  Tongue midline Neck:  No JVD, masses, TMG Lungs:  Fine crackles in right base but otherwise good BS.  No cough or SOB Heart: RRR.  No mrg.  S1, s2 present.   Abdomen:  Tender without guarding/rebound in  upper abdomen bil and epigastrium. No HSM, no masses, no hernias, no bruits.     Rectal: deferred.     Musc/Skeltl: no joint erythema or swelling.  Arthritic changes in fingers Extremities:  No CCE.  Feet warm with palpable pedal pulses.    Neurologic:  Oriented x 3.  Good memory.  Moves all 4 limbs without weakness.  No tremor Skin:  No telangectasia, suspicious lesions or rashes.   Nodes:  No cervical adenopathy   Psych:  Pleasant, mildly anxious.     Intake/Output from previous day: No intake/output data recorded. Intake/Output this shift: No intake/output data recorded.  LAB RESULTS: Recent Labs    10/14/17 0512  WBC 11.7*  HGB 14.3  HCT 42.5  PLT 173   BMET Lab Results  Component Value Date   NA 137 10/14/2017   NA 141 09/04/2017   NA 143 07/12/2017   K 3.9 10/14/2017   K 4.4 09/04/2017   K 4.5 07/12/2017   CL 102 10/14/2017   CL 104 09/04/2017   CL 101 07/12/2017   CO2 24 10/14/2017   CO2 24 09/04/2017   CO2 23 07/12/2017   GLUCOSE 154 (H) 10/14/2017   GLUCOSE 86 09/04/2017   GLUCOSE 87 07/12/2017   BUN 9 10/14/2017   BUN 8 09/04/2017   BUN 10 07/12/2017   CREATININE 1.08 10/14/2017     CREATININE 1.01 09/04/2017   CREATININE 0.98 07/12/2017   CALCIUM 9.2 10/14/2017   CALCIUM 9.0 09/04/2017   CALCIUM 9.3 07/12/2017   LFT Recent Labs    10/14/17 0512  PROT 7.3  ALBUMIN 3.9  AST 469*  ALT 241*  ALKPHOS 205*  BILITOT 3.9*   PT/INR Lab Results  Component Value Date   INR 0.9 05/20/2008   Hepatitis Panel No results for input(s): HEPBSAG, HCVAB, HEPAIGM, HEPBIGM in the last 72 hours. C-Diff No components found for: CDIFF Lipase     Component Value Date/Time   LIPASE 23 10/14/2017 0512    Drugs of Abuse     Component Value Date/Time   LABOPIA NEG 11/26/2014 1359   LABOPIA POSITIVE (A) 05/20/2008 0500   COCAINSCRNUR NEG 11/26/2014 1359   LABBENZ PPS 11/26/2014 1359   LABBENZ NEG 01/28/2009 2124   AMPHETMU NEG 11/26/2014 1359   AMPHETMU  NONE DETECTED 05/20/2008 0500   THCU NEG 11/26/2014 1359   THCU NONE DETECTED 05/20/2008 0500   LABBARB NEG 11/26/2014 1359   LABBARB  05/20/2008 0500    NONE DETECTED        DRUG SCREEN FOR MEDICAL PURPOSES ONLY.  IF CONFIRMATION IS NEEDED FOR ANY PURPOSE, NOTIFY LAB WITHIN 5 DAYS.     RADIOLOGY STUDIES: Ct Abdomen Pelvis W Contrast  Result Date: 10/14/2017 CLINICAL DATA:  Generalized abdominal pain and vomiting since last night. EXAM: CT ABDOMEN AND PELVIS WITH CONTRAST TECHNIQUE: Multidetector CT imaging of the abdomen and pelvis was performed using the standard protocol following bolus administration of intravenous contrast. CONTRAST:  100mL ISOVUE-300 IOPAMIDOL (ISOVUE-300) INJECTION 61% COMPARISON:  Plain films of earlier today. Most recent CT 03/23/2016. FINDINGS: Lower chest: Clear lung bases. Normal heart size without pericardial or pleural effusion. prominent periesophageal node measures 10 mm on image 2/series 3 and is similar to the 2017 exam, favoring a benign/ reactive etiology Hepatobiliary: Normal liver. Partially contracted gallbladder. No calcified stones. No biliary duct dilatation. Pancreas: Mild pancreatic atrophy, without duct dilatation or dominant mass. Spleen: Normal in size, without focal abnormality. Adrenals/Urinary Tract: Normal adrenal glands. 3 mm upper pole left renal collecting system calculus. Bilateral too small to characterize renal lesions. A 2.0 cm lower pole right renal cyst. Normal urinary bladder. Stomach/Bowel: Normal stomach, without wall thickening. Normal colon and terminal ileum. Normal small bowel. Normal appendix, including on coronal image 74. Vascular/Lymphatic: Aortic and branch vessel atherosclerosis. Retroaortic left renal vein. No abdominopelvic adenopathy. Reproductive: Mild prostatomegaly. Other: No significant free fluid. Musculoskeletal: Moderate compression deformity of T12 is similar to on the prior exam. There is an inferior endplate L4  compression deformity which is mild-to-moderate, new since the prior. IMPRESSION: 1.  No acute process in the abdomen or pelvis. 2.  Aortic Atherosclerosis (ICD10-I70.0). 3. Chronic T12 and new L4 compression deformities. 4. Left nephrolithiasis. Electronically Signed   By: Kyle  Talbot M.D.   On: 10/14/2017 07:59   Dg Abd Portable 1 View  Result Date: 10/14/2017 CLINICAL DATA:  Abdominal pain and vomiting. EXAM: PORTABLE ABDOMEN - 1 VIEW COMPARISON:  None. FINDINGS: Upright views of the abdomen demonstrate no evidence of free air. Air-filled normal caliber small bowel in the right upper quadrant. Small stool burden in the upper abdomen. Subsegmental atelectasis at the left lung base. IMPRESSION: No free air.  No evidence of bowel obstruction. Electronically Signed   By: Melanie  Ehinger M.D.   On: 10/14/2017 06:46     IMPRESSION:   *  N/V, abd pain.  Elevated   LFTs.  Contracted GB on CT and US but no evidence of cholelithiasis or choledocholithiasis.  ? Acalculous cholecystitis.  ? ETOH hepatitis with ASA, NSAID induced PUD or ETOH gastritis, given the tremendous volume of ASA products plus Mobic which he uses on a daily basis.     Drinks significant volume of malt beverage as well.    *  Migraine headaches.  Says he has been seen at neurologist, headache clinic in past but current mgt strategy of lots of ASA is not sustainable and may be causing his acute sxs.    *  Hyperglycemia.  No hx of DM.    *  Hx tubular adenomatous colon polyps in 2009.  Date of latest colonoscopy unknown.  PLAN:     *  IV Protonix BID today, start oral Protonix BID in AM.  No plans to pursue EGD at present.    *  EGD tomorrow.    *  Acute hepatitis panel.  LFTs and CBC in AM.    *  Effective headache mgt strategy that minimizes ASA.     Sarah Gribbin  10/14/2017, 11:05 AM Pager: [phone]  GI ATTENDING  History, laboratories, x-rays, prior endoscopy reports reviewed. Patient personally seen and examined.  Agree with comprehensive consultation note as outlined above. Patient presents with acute abdominal pain with nausea and vomiting exacerbated by meals. Abnormal liver tests as outlined. No evidence for cholecystitis or choledocholithiasis. No evidence for bowel obstruction or intra-abdominal inflammatory process. Possible hepatitis viral versus nonviral. Moderate tenderness on exam. Plan diagnostic upper endoscopy tomorrow.The nature of the procedure, as well as the risks, benefits, and alternatives were carefully and thoroughly reviewed with the patient. Ample time for discussion and questions allowed. The patient understood, was satisfied, and agreed to proceed. Agree with hepatitis serologies and trending liver tests. Sips of clears only for now.  Issa Luster N. Annalyse Langlais, Jr., M.D. [facility]   

## 2017-10-14 NOTE — ED Notes (Signed)
Pt. Return from CT via stretcher. 

## 2017-10-14 NOTE — H&P (View-Only) (Signed)
[facility] Consult: 11:05 AM 10/14/2017  LOS: 0 days    Referring Provider: Dr Tomi Bamberger in ED  Primary Care Physician:  Lorella Nimrod, MD of [facility].   Primary Gastroenterologist:  Dr. Carlean Purl.       Reason for Consultation:  abd pain, N/V and elevated LFTs.     HPI: [name] is a 72 y.o. male.  PMH COPD. Deg spinal dz.  Osteoporosis.   01/2008 Colonoscopy for hematochezia, change in bowel habits: 10 mm ascending polyp (TA), 6 mm rectal (TA and HP type) polyps.  Small int rrhoids.     Presented to ED 5 AM with upper abd pain beginning after evening meal.  No radiation into his back.  Multiple episodes of pink tinged emesis.   Pain is "12/10", emesis pink tinged.  Began 20 min after meal of cube steak, macaroni salad, broccoli.  No prior sxs or GI troubles.  Stools brown, mostly daily.  Took a laxative, thinking it might help: had no BM and no relief of sxs.     T bili 3.9.  Alk phos 205.  AST/ALT 469/241.  Lipase 23.  LFTs normal in 2016 and 2017.  WBCs 11.7.   CT ab/pelvis with contrast:  Liver normal.  Contracted GB without stones.  Biliary and pancreatic ducts wnl.  Atrophic pancreas.  Normal stomach and intestine.  New L4 and old T12 compression fx.  Left kidney stone.   Abd ultrasound:  Gallbladder contracted, not well visualized. No visible stones or wall thickening. No biliary duct dilatation.  Potentially GI disruptive meds include Fosamax weekly, 81 ASA, Mobic, hydrocodone.  Takes up to 10 ASA powders daily (range is 4 to 10), takes three 325 ASA daily plus the 81 Mg ASA, Mobic, hydrocodone.No PPI, h2 blocker.    Drinks 60 to 80 oz of a 6% alcohol canned "power drink" on Sat and sundays.  Less ETOH during week.      Past Medical History:  Diagnosis Date  . Anxiety and depression    . COPD (chronic obstructive pulmonary disease) ([place])   . GERD (gastroesophageal reflux disease)   . History of migraine headaches   . HTN (hypertension)   . Hyperlipemia   . Personal history of alcoholism ([place])   . Tobacco use   . Tubular adenoma 01/29/2008    Past Surgical History:  Procedure Laterality Date  . ANTERIOR FUSION CERVICAL SPINE  2005   two surgeries after two mechanical falls, per Dr. Donald Pore  . COLONOSCOPY W/ BIOPSIES  01/29/2008   Dr. Silvano Rusk  . [place]   correction for crossed-eyes  . [place]    Prior to Admission medications   Medication Sig Start Date End Date Taking? Authorizing Provider  acetaminophen (TYLENOL) 325 MG tablet Take 2 tablets (650 mg total) by mouth every 6 (six) hours as needed for mild pain or moderate pain. 06/25/17  Yes Waynetta Pean, PA-C  alendronate (FOSAMAX) 70 MG tablet Take 1 tablet (70 mg total) by mouth every 7 (seven)  days. Take with a full glass of water on an empty stomach. 06/27/17 06/27/18 Yes Alphonzo Grieve, MD  aspirin EC 81 MG tablet Take 1 tablet (81 mg total) by mouth daily. 04/12/17 04/12/18 Yes Lorella Nimrod, MD  atorvastatin (LIPITOR) 20 MG tablet Take 1 tablet (20 mg total) by mouth daily. 10/03/17  Yes Lorella Nimrod, MD  Diclofenac Sodium 3 % GEL Place 1 application onto the skin 2 (two) times daily. To affected area. 06/25/17  Yes Waynetta Pean, PA-C  furosemide (LASIX) 20 MG tablet Take 1 tablet (20 mg total) by mouth daily. For leg swelling. 08/02/17 08/02/18 Yes Lorella Nimrod, MD  gabapentin (NEURONTIN) 300 MG capsule Take 1 capsule (300 mg total) by mouth 2 (two) times daily. 07/12/17  Yes Lorella Nimrod, MD  HYDROcodone-acetaminophen (NORCO) 5-325 MG tablet Take 1 tablet by mouth every 6 (six) hours as needed for moderate pain. 09/04/17  Yes Rice, Resa Miner, MD  meloxicam (MOBIC) 7.5 MG tablet Take 7.5 mg by mouth daily.   Yes [provider]  Multiple Vitamins-Minerals  (CENTRUM SILVER ADULT 50+) TABS Take 1 tablet by mouth daily.   Yes [provider]  PROAIR HFA 108 (90 Base) MCG/ACT inhaler INHALE 2 PUFFS INTO THE LUNGS FOUR TIMES DAILY Patient taking differently: INHALE 2 PUFFS INTO THE LUNGS FOUR TIMES DAILY AS NEEDED FOR SOB. 07/22/17  Yes Lorella Nimrod, MD  tiotropium (SPIRIVA) 18 MCG inhalation capsule Place 1 capsule (18 mcg total) into inhaler and inhale daily. 02/20/17  Yes Lorella Nimrod, MD  tiZANidine (ZANAFLEX) 2 MG tablet Take 2 mg by mouth every 6 (six) hours as needed for muscle spasms.   Yes [provider]    Scheduled Meds:  Infusions:  PRN Meds:    Allergies as of 10/14/2017  . (No Known Allergies)    Family History  Problem Relation Age of Onset  . Heart attack Mother   . Heart attack Father   . Stroke Sister   . Heart attack Brother   . Heart attack Sister     Social History   Socioeconomic History  . Marital status: Divorced    Spouse name: Not on file  . Number of children: Not on file  . Years of education: Not on file  . Highest education level: Not on file  Social Needs  . Financial resource strain: Not on file  . Food insecurity - worry: Not on file  . Food insecurity - inability: Not on file  . Transportation needs - medical: Not on file  . Transportation needs - non-medical: Not on file  Occupational History  . Occupation: On disability   . Occupation: Estate manager/land agent    Comment: he worked at [employer] in [place]  . Occupation: Child psychotherapist:     Comment: years ago  Tobacco Use  . Smoking status: Current Every Day Smoker    Packs/day: 0.50    Years: 55.00    Pack years: 27.50    Types: Cigarettes  . Smokeless tobacco: Never Used  Substance and Sexual Activity  . Alcohol use: Yes    Alcohol/week: 0.0 oz    Comment: Beer-rarely.  . Drug use: No  . Sexual activity: Not on file  Other Topics Concern  . Not on file  Social History  Narrative   His son died in a car accident in 31. His granddaughter died in a house fire in 29-Nov-2009.    He lives by himself in a  boarding house. He prepares his own meals, he is able to go to the pharmacy and grocery store. He uses public transportation. He uses a cane at his baseline and is independent of ADLs. Most of his family live in [place], [place], or [place]--brother, nieces and nefews. He has a sister in [place], [place] but she is disabled. He attends [employer] in [place] and his Doristine Bosworth there, [name], calls him almost every other day and checks on him 3-4 times per month.     REVIEW OF SYSTEMS: Constitutional:  Feels tired weak now, not generally ENT:  No nose bleeds Pulm:  No SOB or cough CV:  No palpitations, no LE edema.  GU:  No hematuria, no frequency GI:  Per HPI.  No dysphagia.   Heme:  No unusual bleeding or bruising   Transfusions:  None.   Neuro:  No headaches, no peripheral tingling or numbness Derm:  No itching, no rash or sores.  Endocrine:  No sweats or chills.  No polyuria or dysuria Immunization:  Reviewed.  Flu shot up to date.   Travel:  None beyond local counties in last few months.    PHYSICAL EXAM: Vital signs in last 24 hours: Vitals:   10/14/17 0900 10/14/17 0930  BP: 124/78 117/77  Pulse: (!) 130 (!) 123  Resp: (!) 21 14  Temp:    SpO2: 95% 95%   Wt Readings from Last 3 Encounters:  10/14/17 78 kg (172 lb)  09/04/17 80.1 kg (176 lb 8 oz)  08/02/17 79.5 kg (175 lb 3.2 oz)    General: elderly WM in some distress, comfortable currently sitting up in stretcher.   Head:  No asymmetry or edema.  No head trauma  Eyes:  No icterus or conj pallor Ears:  Slightly HOH  Nose:  No congestion or discharge. Mouth:  Moist, pink and clear oral mm.  Tongue midline Neck:  No JVD, masses, TMG Lungs:  Fine crackles in right base but otherwise good BS.  No cough or SOB Heart: RRR.  No mrg.  S1, s2 present.   Abdomen:  Tender without guarding/rebound in  upper abdomen bil and epigastrium. No HSM, no masses, no hernias, no bruits.     Rectal: deferred.     Musc/Skeltl: no joint erythema or swelling.  Arthritic changes in fingers Extremities:  No CCE.  Feet warm with palpable pedal pulses.    Neurologic:  Oriented x 3.  Good memory.  Moves all 4 limbs without weakness.  No tremor Skin:  No telangectasia, suspicious lesions or rashes.   Nodes:  No cervical adenopathy   Psych:  Pleasant, mildly anxious.     Intake/Output from previous day: No intake/output data recorded. Intake/Output this shift: No intake/output data recorded.  LAB RESULTS: Recent Labs    10/14/17 0512  WBC 11.7*  HGB 14.3  HCT 42.5  PLT 173   BMET Lab Results  Component Value Date   NA 137 10/14/2017   NA 141 09/04/2017   NA 143 07/12/2017   K 3.9 10/14/2017   K 4.4 09/04/2017   K 4.5 07/12/2017   CL 102 10/14/2017   CL 104 09/04/2017   CL 101 07/12/2017   CO2 24 10/14/2017   CO2 24 09/04/2017   CO2 23 07/12/2017   GLUCOSE 154 (H) 10/14/2017   GLUCOSE 86 09/04/2017   GLUCOSE 87 07/12/2017   BUN 9 10/14/2017   BUN 8 09/04/2017   BUN 10 07/12/2017   CREATININE 1.08 10/14/2017  CREATININE 1.01 09/04/2017   CREATININE 0.98 07/12/2017   CALCIUM 9.2 10/14/2017   CALCIUM 9.0 09/04/2017   CALCIUM 9.3 07/12/2017   LFT Recent Labs    10/14/17 0512  PROT 7.3  ALBUMIN 3.9  AST 469*  ALT 241*  ALKPHOS 205*  BILITOT 3.9*   PT/INR Lab Results  Component Value Date   INR 0.9 05/20/2008   Hepatitis Panel No results for input(s): HEPBSAG, HCVAB, HEPAIGM, HEPBIGM in the last 72 hours. C-Diff No components found for: CDIFF Lipase     Component Value Date/Time   LIPASE 23 10/14/2017 0512    Drugs of Abuse     Component Value Date/Time   LABOPIA NEG 11/26/2014 1359   LABOPIA POSITIVE (A) 05/20/2008 0500   COCAINSCRNUR NEG 11/26/2014 1359   LABBENZ PPS 11/26/2014 1359   LABBENZ NEG 01/28/2009 2124   AMPHETMU NEG 11/26/2014 1359   AMPHETMU  NONE DETECTED 05/20/2008 0500   THCU NEG 11/26/2014 1359   THCU NONE DETECTED 05/20/2008 0500   LABBARB NEG 11/26/2014 1359   LABBARB  05/20/2008 0500    NONE DETECTED        DRUG SCREEN FOR MEDICAL PURPOSES ONLY.  IF CONFIRMATION IS NEEDED FOR ANY PURPOSE, NOTIFY LAB WITHIN 5 DAYS.     RADIOLOGY STUDIES: Ct Abdomen Pelvis W Contrast  Result Date: 10/14/2017 CLINICAL DATA:  Generalized abdominal pain and vomiting since last night. EXAM: CT ABDOMEN AND PELVIS WITH CONTRAST TECHNIQUE: Multidetector CT imaging of the abdomen and pelvis was performed using the standard protocol following bolus administration of intravenous contrast. CONTRAST:  15m ISOVUE-300 IOPAMIDOL (ISOVUE-300) INJECTION 61% COMPARISON:  Plain films of earlier today. Most recent CT 03/23/2016. FINDINGS: Lower chest: Clear lung bases. Normal heart size without pericardial or pleural effusion. prominent periesophageal node measures 10 mm on image 2/series 3 and is similar to the 2017 exam, favoring a benign/ reactive etiology Hepatobiliary: Normal liver. Partially contracted gallbladder. No calcified stones. No biliary duct dilatation. Pancreas: Mild pancreatic atrophy, without duct dilatation or dominant mass. Spleen: Normal in size, without focal abnormality. Adrenals/Urinary Tract: Normal adrenal glands. 3 mm upper pole left renal collecting system calculus. Bilateral too small to characterize renal lesions. A 2.0 cm lower pole right renal cyst. Normal urinary bladder. Stomach/Bowel: Normal stomach, without wall thickening. Normal colon and terminal ileum. Normal small bowel. Normal appendix, including on coronal image 74. Vascular/Lymphatic: Aortic and branch vessel atherosclerosis. Retroaortic left renal vein. No abdominopelvic adenopathy. Reproductive: Mild prostatomegaly. Other: No significant free fluid. Musculoskeletal: Moderate compression deformity of T12 is similar to on the prior exam. There is an inferior endplate L4  compression deformity which is mild-to-moderate, new since the prior. IMPRESSION: 1.  No acute process in the abdomen or pelvis. 2.  Aortic Atherosclerosis (ICD10-I70.0). 3. Chronic T12 and new L4 compression deformities. 4. Left nephrolithiasis. Electronically Signed   By: KAbigail MiyamotoM.D.   On: 10/14/2017 07:59   Dg Abd Portable 1 View  Result Date: 10/14/2017 CLINICAL DATA:  Abdominal pain and vomiting. EXAM: PORTABLE ABDOMEN - 1 VIEW COMPARISON:  None. FINDINGS: Upright views of the abdomen demonstrate no evidence of free air. Air-filled normal caliber small bowel in the right upper quadrant. Small stool burden in the upper abdomen. Subsegmental atelectasis at the left lung base. IMPRESSION: No free air.  No evidence of bowel obstruction. Electronically Signed   By: MJeb LeveringM.D.   On: 10/14/2017 06:46     IMPRESSION:   *  N/V, abd pain.  Elevated  LFTs.  Contracted GB on CT and [place] but no evidence of cholelithiasis or choledocholithiasis.  ? Acalculous cholecystitis.  ? ETOH hepatitis with ASA, NSAID induced PUD or ETOH gastritis, given the tremendous volume of ASA products plus Mobic which he uses on a daily basis.     Drinks significant volume of malt beverage as well.    *  Migraine headaches.  Says he has been seen at neurologist, headache clinic in past but current mgt strategy of lots of ASA is not sustainable and may be causing his acute sxs.    *  Hyperglycemia.  No hx of DM.    *  Hx tubular adenomatous colon polyps in 2009.  Date of latest colonoscopy unknown.  PLAN:     *  IV Protonix BID today, start oral Protonix BID in AM.  No plans to pursue EGD at present.    *  EGD tomorrow.    *  Acute hepatitis panel.  LFTs and CBC in AM.    *  Effective headache mgt strategy that minimizes ASA.     Azucena Freed  10/14/2017, 11:05 AM Pager: [phone]  GI ATTENDING  History, laboratories, x-rays, prior endoscopy reports reviewed. Patient personally seen and examined.  Agree with comprehensive consultation note as outlined above. Patient presents with acute abdominal pain with nausea and vomiting exacerbated by meals. Abnormal liver tests as outlined. No evidence for cholecystitis or choledocholithiasis. No evidence for bowel obstruction or intra-abdominal inflammatory process. Possible hepatitis viral versus nonviral. Moderate tenderness on exam. Plan diagnostic upper endoscopy tomorrow.The nature of the procedure, as well as the risks, benefits, and alternatives were carefully and thoroughly reviewed with the patient. Ample time for discussion and questions allowed. The patient understood, was satisfied, and agreed to proceed. Agree with hepatitis serologies and trending liver tests. Sips of clears only for now.  Docia Chuck. Geri Seminole., M.D. [facility] Division of Gastroenterology

## 2017-10-14 NOTE — ED Notes (Signed)
Pt. To CT via stretcher. 

## 2017-10-15 ENCOUNTER — Inpatient Hospital Stay (HOSPITAL_COMMUNITY): Payer: Medicare Other

## 2017-10-15 ENCOUNTER — Inpatient Hospital Stay (HOSPITAL_COMMUNITY): Payer: Medicare Other | Admitting: Certified Registered"

## 2017-10-15 ENCOUNTER — Encounter (HOSPITAL_COMMUNITY): Payer: Self-pay | Admitting: *Deleted

## 2017-10-15 ENCOUNTER — Encounter (HOSPITAL_COMMUNITY)
Admission: EM | Disposition: A | Discharge status: Other Acute Inpatient Hospital | Payer: Self-pay | Source: Home / Self Care | Attending: Internal Medicine

## 2017-10-15 DIAGNOSIS — K831 Obstruction of bile duct: Secondary | ICD-10-CM | POA: Diagnosis present

## 2017-10-15 DIAGNOSIS — K759 Inflammatory liver disease, unspecified: Secondary | ICD-10-CM

## 2017-10-15 DIAGNOSIS — Z7982 Long term (current) use of aspirin: Secondary | ICD-10-CM

## 2017-10-15 DIAGNOSIS — Z791 Long term (current) use of non-steroidal anti-inflammatories (NSAID): Secondary | ICD-10-CM

## 2017-10-15 DIAGNOSIS — M549 Dorsalgia, unspecified: Secondary | ICD-10-CM

## 2017-10-15 DIAGNOSIS — Z9889 Other specified postprocedural states: Secondary | ICD-10-CM

## 2017-10-15 DIAGNOSIS — K7290 Hepatic failure, unspecified without coma: Secondary | ICD-10-CM | POA: Diagnosis present

## 2017-10-15 DIAGNOSIS — K3184 Gastroparesis: Secondary | ICD-10-CM | POA: Diagnosis present

## 2017-10-15 DIAGNOSIS — R112 Nausea with vomiting, unspecified: Secondary | ICD-10-CM | POA: Diagnosis present

## 2017-10-15 DIAGNOSIS — G8929 Other chronic pain: Secondary | ICD-10-CM

## 2017-10-15 HISTORY — PX: ESOPHAGOGASTRODUODENOSCOPY: SHX5428

## 2017-10-15 LAB — COMPREHENSIVE METABOLIC PANEL
ALT: 197 U/L — ABNORMAL HIGH (ref 17–63)
AST: 182 U/L — ABNORMAL HIGH (ref 15–41)
Albumin: 2.8 g/dL — ABNORMAL LOW (ref 3.5–5.0)
Alkaline Phosphatase: 143 U/L — ABNORMAL HIGH (ref 38–126)
Anion gap: 10 (ref 5–15)
BUN: 13 mg/dL (ref 6–20)
CO2: 22 mmol/L (ref 22–32)
Calcium: 7.4 mg/dL — ABNORMAL LOW (ref 8.9–10.3)
Chloride: 103 mmol/L (ref 101–111)
Creatinine, Ser: 1.26 mg/dL — ABNORMAL HIGH (ref 0.61–1.24)
GFR calc Af Amer: >60 mL/min (ref >60)
GFR calc non Af Amer: 56 mL/min — ABNORMAL LOW (ref >60)
Glucose, Bld: 81 mg/dL (ref 65–99)
Potassium: 3.4 mmol/L — ABNORMAL LOW (ref 3.5–5.1)
Sodium: 135 mmol/L (ref 135–145)
Total Bilirubin: 5.3 mg/dL — ABNORMAL HIGH (ref 0.3–1.2)
Total Protein: 6 g/dL — ABNORMAL LOW (ref 6.5–8.1)

## 2017-10-15 LAB — CBC
HCT: 38.2 % — ABNORMAL LOW (ref 39.0–52.0)
Hemoglobin: 12.8 g/dL — ABNORMAL LOW (ref 13.0–17.0)
MCH: 32.2 pg (ref 26.0–34.0)
MCHC: 33.5 g/dL (ref 30.0–36.0)
MCV: 96.2 fL (ref 78.0–100.0)
Platelets: 105 10*3/uL — ABNORMAL LOW (ref 150–400)
RBC: 3.97 MIL/uL — ABNORMAL LOW (ref 4.22–5.81)
RDW: 13 % (ref 11.5–15.5)
WBC: 8.5 10*3/uL (ref 4.0–10.5)

## 2017-10-15 LAB — LIPASE, BLOOD: LIPASE: 17 U/L (ref 11–51)

## 2017-10-15 LAB — HEPATITIS PANEL, ACUTE
HCV Ab: <0.1 s/co ratio (ref 0.0–0.9)
HEP B S AG: NEGATIVE
Hep A IgM: NEGATIVE
Hep B C IgM: NEGATIVE

## 2017-10-15 LAB — LIPID PANEL
Cholesterol: 88 mg/dL (ref 0–200)
HDL: 24 mg/dL — ABNORMAL LOW (ref >40)
LDL Cholesterol: 50 mg/dL (ref 0–99)
Total CHOL/HDL Ratio: 3.7 RATIO
Triglycerides: 72 mg/dL (ref <150)
VLDL: 14 mg/dL (ref 0–40)

## 2017-10-15 LAB — DIRECT ANTIGLOBULIN TEST (NOT AT ARMC)
DAT, IgG: NEGATIVE
DAT, complement: NEGATIVE

## 2017-10-15 LAB — RETICULOCYTES
RBC.: 3.93 MIL/uL — ABNORMAL LOW (ref 4.22–5.81)
Retic Count, Absolute: 59 10*3/uL (ref 19.0–186.0)
Retic Ct Pct: 1.5 % (ref 0.4–3.1)

## 2017-10-15 LAB — LACTATE DEHYDROGENASE: LDH: 275 U/L — ABNORMAL HIGH (ref 98–192)

## 2017-10-15 LAB — SAVE SMEAR

## 2017-10-15 SURGERY — EGD (ESOPHAGOGASTRODUODENOSCOPY)
Anesthesia: Monitor Anesthesia Care

## 2017-10-15 MED ORDER — FLEET ENEMA 7-19 GM/118ML RE ENEM
1.0000 | ENEMA | Freq: Once | RECTAL | Status: AC
  Administered 2017-10-15: 1 via RECTAL
  Filled 2017-10-15: qty 1

## 2017-10-15 MED ORDER — SODIUM CHLORIDE 0.9 % IV BOLUS (SEPSIS)
1000.0000 mL | Freq: Once | INTRAVENOUS | Status: AC
  Administered 2017-10-15: 1000 mL via INTRAVENOUS

## 2017-10-15 MED ORDER — PROPOFOL 500 MG/50ML IV EMUL
INTRAVENOUS | Status: DC | PRN
  Administered 2017-10-15: 100 ug/kg/min via INTRAVENOUS

## 2017-10-15 MED ORDER — ACETAMINOPHEN 325 MG PO TABS
650.0000 mg | ORAL_TABLET | Freq: Four times a day (QID) | ORAL | Status: DC | PRN
  Administered 2017-10-17 – 2017-10-18 (×3): 650 mg via ORAL
  Filled 2017-10-15 (×3): qty 2

## 2017-10-15 MED ORDER — SODIUM CHLORIDE 0.9 % IV SOLN
1000.0000 mL | INTRAVENOUS | Status: DC
  Administered 2017-10-15 – 2017-10-16 (×3): 1000 mL via INTRAVENOUS

## 2017-10-15 MED ORDER — FLEET ENEMA 7-19 GM/118ML RE ENEM: 1.0000 | ENEMA | Freq: Once | RECTAL | Status: DC

## 2017-10-15 MED ORDER — LACTATED RINGERS IV SOLN: INTRAVENOUS | Status: DC | PRN

## 2017-10-15 MED ORDER — PROPOFOL 10 MG/ML IV BOLUS
INTRAVENOUS | Status: DC | PRN
  Administered 2017-10-15: 30 mg via INTRAVENOUS

## 2017-10-15 MED ORDER — PIPERACILLIN-TAZOBACTAM 3.375 G IVPB
3.3750 g | Freq: Three times a day (TID) | INTRAVENOUS | Status: DC
  Administered 2017-10-15 – 2017-10-18 (×11): 3.375 g via INTRAVENOUS
  Filled 2017-10-15 (×13): qty 50

## 2017-10-15 MED ORDER — LIDOCAINE 2% (20 MG/ML) 5 ML SYRINGE
INTRAMUSCULAR | Status: DC | PRN
  Administered 2017-10-15: 40 mg via INTRAVENOUS

## 2017-10-15 NOTE — Progress Notes (Signed)
  Date: 10/15/2017  Patient name: [name]  Medical record number: [id number]  Date of birth: [birth year]   I saw and evaluated the patient. I reviewed the resident's note and I agree with the resident's findings and plan as documented in the resident's note.  Chief Complaint(s): Abdominal pain  History - key components related to admission:  Please see resident note for details.  Briefly, [name] is a 72 year old man with a history of hypertension, smoking, and chronic back pain status post multiple operations who presented with acute onset diffuse abdominal pain. The pain started shortly after dinner the night before admission and is sharp and crampy. Since the pain began, he has had multiple episodes of vomiting nonbloody, nonbilious, pink tinged material. He has not had a bowel movement since the pain started, and reports not passing any gas.  He is a significant amount of NSAIDs, including 3-4 packets of Goody powder, meloxicam, aspirin daily. He also drinks malt liquor. No history of abdominal surgeries.  Physical Exam - key components related to admission:  Vitals:   10/15/17 1110 10/15/17 1232 10/15/17 1240 10/15/17 1329  BP: 97/60 94/60 99/61  98/68  Pulse: 98  (!) 101 (!) 111  Resp: 13 (!) 21 17 18   Temp: 98.1 F (36.7 C)   97.8 F (36.6 C)  TempSrc: Oral   Oral  SpO2: 95% 93% 94% 90%  Weight:      Height:        General: Alert, appears very uncomfortable, moderate distress Cardiovascular: Tachycardic to just over 100, regular, no murmurs rubs or gallops Pulmonary: Clear to auscultation bilaterally, mildly to the low 20s with shallow breathing Abdominal: Distended, particularly along the upper portion (which is accentuated by his pectus excavatum), firm but not rigid, diffusely tender to palpation with guarding Extremities: Warm, no edema Skin: No significant rashes or lesions Neuro: Alert and oriented, speech normal  Assessment and Plan: I have seen and  evaluated the patient as outlined in the resident's note. I agree with the formulated Assessment and Plan as detailed in the resident's note, with the following changes:   Principal Problem:   Abdominal pain Active Problems:   Gastroparesis   Hepatitis   Cholestasis   Nausea with vomiting   1. Abdominal pain:  Unclear etiology, history and exam are concerning for possible SBO given no passage of stool or gas, vomiting, and distended tender abdomen. We consulted surgery on rounds this morning, and I appreciate their assistance. No acute surgical intervention needed at this time. Plain films and CT without clear evidence of obstruction or other etiology to explain his pain. Only finding on imaging was moderate constipation, which I would not expect to cause his degree of pain and vomiting so suddenly. Also unclear how his hepatitis and cholestasis are related. Combined with his fever, raises a concern for cholangitis, although surprising that there was no CBD dilation on imaging. EGD today showed food retained in the stomach but no other acute findings. Appreciate GI assistance as well. -Will trial advancing diet now per recommendations from our consultants and see how he tolerates it -Bowel regimen to try to improve constipation -Continue Zosyn for concern of cholangitis, follow cultures -Continue to trend LFTs and will discuss with GI utility of HIDA scan or MRCP  Oda Kilts, MD 1/8/20194:09 PM

## 2017-10-15 NOTE — Interval H&P Note (Signed)
History and Physical Interval Note:  10/15/2017 12:09 PM  [name]  has presented today for surgery, with the diagnosis of abdominal pain and vomiting pinkish emesis.  using lots of ASA and elevated LFTs/hepatitis  The various methods of treatment have been discussed with the patient and family. After consideration of risks, benefits and other options for treatment, the patient has consented to  Procedure(s): ESOPHAGOGASTRODUODENOSCOPY (EGD) (N/A) as a surgical intervention .  The patient's history has been reviewed, patient examined, no change in status, stable for surgery.  I have reviewed the patient's chart and labs.  Questions were answered to the patient's satisfaction.    Still complaining of pain but says that it is less. Has not had a bowel movement since Saturday. KUB negative except for increased stool. Plan diagnostic EGD. If negative, enemas for constipation   [name]

## 2017-10-15 NOTE — Op Note (Signed)
[facility] Patient Name: [name] Procedure Date : 10/15/2017 MRN: [id number] Attending MD: Docia Chuck. Henrene Pastor , MD Date of Birth: 01-Jul-1946 CSN: [id number] Age: 72 Admit Type: Inpatient Procedure:                Upper GI endoscopy Indications:              Generalized abdominal pain, nausea and vomiting Providers:                Docia Chuck. Henrene Pastor, MD, Cleda Daub, RN, Elspeth Cho Tech., Technician, Sampson Si, CRNA Referring MD:             [facility] Medicines:                Monitored Anesthesia Care Complications:            No immediate complications. Estimated Blood Loss:     Estimated blood loss: none. Procedure:                Pre-Anesthesia Assessment:                           - Prior to the procedure, a History and Physical                            was performed, and patient medications and                            allergies were reviewed. The patient's tolerance of                            previous anesthesia was also reviewed. The risks                            and benefits of the procedure and the sedation                            options and risks were discussed with the patient.                            All questions were answered, and informed consent                            was obtained. Prior Anticoagulants: The patient has                            taken no previous anticoagulant or antiplatelet                            agents. After reviewing the risks and benefits, the                            patient was deemed in satisfactory condition to  undergo the procedure.                           After obtaining informed consent, the endoscope was                            passed under direct vision. Throughout the                            procedure, the patient's blood pressure, pulse, and                            oxygen saturations were monitored continuously. The                             [id number] ([id number]) scope was introduced through the                            mouth, and advanced to the second part of duodenum.                            The upper GI endoscopy was accomplished without                            difficulty. The patient tolerated the procedure                            well. Scope In: Scope Out: Findings:      The esophagus was normal.      A large amount of food (residue) was found in the gastric fundus.      The examined stomach was otherwise normal.      The cardia and gastric fundus were normal on retroflexion, saved       retained gastric contents.      The examined duodenum was normal to the third portion. Impression:               1. Large volume of retained solid food consistent                            with gastroparesis. This certainly explains                            problems with nausea and vomiting. I'm not certain                            that this explains his significant abdominal                            tenderness on exam. It certainly does not explain                            his liver test abnormalities and low-grade  temperature. Moderate Sedation:      none Recommendation:           1. Clear liquids only                           2. Fleets enemas to see if having bowel movements                            improves his abdominal discomfort                           3. Surgical opinion pending                           4. May need to consider infectious disease opinion.                            He continues with fever. Drop in platelet counts                            also noted Procedure Code(s):        --- Professional ---                           [id number], Esophagogastroduodenoscopy, flexible,                            transoral; diagnostic, including collection of                            specimen(s) by brushing or washing, when performed                             (separate procedure) Diagnosis Code(s):        --- Professional ---                           R10.84, Generalized abdominal pain CPT copyright 2016 American Medical Association. All rights reserved. The codes documented in this report are preliminary and upon coder review may  be revised to meet current compliance requirements. Docia Chuck. Henrene Pastor, MD 10/15/2017 12:37:44 PM This report has been signed electronically. Number of Addenda: 0

## 2017-10-15 NOTE — Anesthesia Preprocedure Evaluation (Signed)
Anesthesia Evaluation  Patient identified by MRN, date of birth, ID band Patient awake    Reviewed: Allergy & Precautions, NPO status , Patient's Chart, lab work & pertinent test results  Airway Mallampati: I  TM Distance: >3 FB Neck ROM: Full    Dental  (+) Edentulous Upper, Edentulous Lower   Pulmonary sleep apnea , COPD,  COPD inhaler, Current Smoker,    breath sounds clear to auscultation       Cardiovascular hypertension, (-) angina Rhythm:Regular Rate:Normal     Neuro/Psych Anxiety negative neurological ROS     GI/Hepatic Neg liver ROS, GERD  ,  Endo/Other  negative endocrine ROS  Renal/GU negative Renal ROS     Musculoskeletal negative musculoskeletal ROS (+)   Abdominal Normal abdominal exam  (+)   Peds  Hematology negative hematology ROS (+)   Anesthesia Other Findings - HLD  Reproductive/Obstetrics                             Anesthesia Physical Anesthesia Plan  ASA: III  Anesthesia Plan: MAC   Post-op Pain Management:    Induction: Intravenous  PONV Risk Score and Plan: Propofol infusion  Airway Management Planned: Nasal Cannula  Additional Equipment: None  Intra-op Plan:   Post-operative Plan:   Informed Consent: I have reviewed the patients History and Physical, chart, labs and discussed the procedure including the risks, benefits and alternatives for the proposed anesthesia with the patient or authorized representative who has indicated his/her understanding and acceptance.   Dental advisory given  Plan Discussed with: CRNA  Anesthesia Plan Comments:         Anesthesia Quick Evaluation

## 2017-10-15 NOTE — Anesthesia Postprocedure Evaluation (Signed)
Anesthesia Post Note  Patient: [name]  Procedure(s) Performed: ESOPHAGOGASTRODUODENOSCOPY (EGD) (N/A )     Patient location during evaluation: PACU Anesthesia Type: MAC Level of consciousness: awake and alert Pain management: pain level controlled Vital Signs Assessment: post-procedure vital signs reviewed and stable Respiratory status: spontaneous breathing, nonlabored ventilation, respiratory function stable and patient connected to nasal cannula oxygen Cardiovascular status: stable and blood pressure returned to baseline Postop Assessment: no apparent nausea or vomiting Anesthetic complications: no    Last Vitals:  Vitals:   10/15/17 1240 10/15/17 1329  BP: 99/61 98/68  Pulse: (!) 101 (!) 111  Resp: 17 18  Temp:  36.6 C  SpO2: 94% 90%                 Effie Berkshire

## 2017-10-15 NOTE — Progress Notes (Addendum)
Subjective: Patient continues to have uncontrolled abdominal pain and distention.  States that the Dilaudid only helps for approximately 45 minutes.  His abdomen hurts with deep inspiration or any type of movement.  He continues to have no bowel movements or flatulence.  He does have an appetite, however experienced emesis with any type of p.o. intake yesterday evening.  Continues to describe his emesis as pink.  Unsure if there is blood.  Otherwise denies fevers or chills.  Discussed the plan to repeat his abdominal films today, have surgery come by to evaluate him, and for him to get the EGD today.  He voices understanding and states that he just wants the pain to go away.  All questions and concerns addressed.  Objective: Vital signs in last 24 hours: Vitals:   10/14/17 1745 10/14/17 1902 10/14/17 2144 10/15/17 0520  BP: 111/67 102/79 (!) 103/57 100/63  Pulse: (!) 119 (!) 130 (!) 115 (!) 116  Resp: 20 19 18 18   Temp: 99.8 F (37.7 C) 98.2 F (36.8 C) (!) 100.4 F (38 C) (!) 100.4 F (38 C)  TempSrc: Oral Oral Oral Oral  SpO2: 95% 97% 96% 95%  Weight:  169 lb 8.5 oz (76.9 kg)    Height:  5\' 7"  (1.702 m)     General: Well-nourished male in acute distress Pulm: Good air movement with no wheezing or crackles CV: Distant heart sounds, regular rate and rhythm, no murmurs or rubs appreciated Abdomen: Hypoactive bowel sounds, firm, distention that appears to be worse in the epigastric region, diffuse tenderness to palpation, with rebounding and guarding Extremities: No lower extremity edema, warm and dry  Assessment/Plan:  Abdominal Pain. - Abdominal pain continues to persist and is no longer relieved by dilaudid  - Patient has been febrile and tachycardic over the interval meeting SIRs criteria. NS bolus was started, blood cultures obtained, and empiric antibiotics for possible intra-abdominal infection were ordered (Zosyn).  - AM labs illustrating resolution of patient's leukocytosis  and improving LFTs.  However, did show increase in bilirubin with a decrease in hemoglobin and platelets.  Hemolytic anemia labs were sent.  At this point it does not appear that the patient has a MAHA.  However will evaluate smear. - Viral hepatitis panel negative - Continue PPI therapy  - Surgery has been consulted to evaluate for acute abdomen.  Repeat abdominal films illustrated moderately increased colonic stool burden with no evidence of perforation or bowel obstruction. - EGD performed today showed normal esophagus and stomach.  However did illustrate a large amount of residual food within the stomach consistent with gastroparesis.  GI does not feel this explains the patient's clinical presentation or his liver enzyme abnormalities. Recommending starting Fleet enemas to see if bowel movements improve his abdominal discomfort and agree with surgical consultation.  Chronic Lumbar Back Pain  - Continuing Gabapentin  - Holding Meloxicam, Norco, and Tizanidine   Migraine Headaches  - Patient uses excessive amounts of NSAIDs for both his migraines and chronic back pain - Will consider prophylaxis with a beta blocker   COPD - Continue Spiriva with albuterol PRN  Hyperlipidemia  - Hold Atorvastatin but will resume on discharge  - LDL 50  Dispo: Anticipated discharge in approximately >1 day(s).   Ina Homes, MD 10/15/2017, 5:55 AM My Pager: [phone]  ADDENDUM: Blood smear reviewed. No evidence of hemolytic anemia. Smear showing thrombocytopenia with multiple lymphocytes with toxic granules and dohle bodies. These findings are consistent with infectious process (10/15/2017 @ 1448)

## 2017-10-15 NOTE — Progress Notes (Signed)
Pharmacy Antibiotic Note  [name] is a 72 y.o. male admitted on 10/14/2017 with intra-abdominal infection.  Pharmacy has been consulted for Zosyn dosing. WBC 11.7. Renal function good.   Plan: Zosyn 3.375G IV q8h to be infused over 4 hours Trend WBC, temp, renal function  F/U infectious work-up  Height: 5\' 7"  (170.2 cm) Weight: 169 lb 8.5 oz (76.9 kg) IBW/kg (Calculated) : 66.1  Temp (24hrs), Avg:99.7 F (37.6 C), Min:98.2 F (36.8 C), Max:100.4 F (38 C)  Recent Labs  Lab 10/14/17 0512 10/15/17 0250  WBC 11.7* 8.5  CREATININE 1.08  --     Estimated Creatinine Clearance: 58.7 mL/min (by C-G formula based on SCr of 1.08 mg/dL).    No Known Allergies   Narda Bonds 10/15/2017 6:09 AM

## 2017-10-15 NOTE — Consult Note (Signed)
[facility] Surgery Consult Note  [name] 1946-03-31  [id number].    Requesting MD: Lenice Pressman Chief Complaint/Reason for Consult: abdominal pain  HPI:  [name] is a 72yo male who was admitted to [facility] 1/8 with acute onset abdominal pain, nausea, vomiting, and constipation. Patient states that the pain began 1/5. It is constant, severe, sharp, and worse with PO intake, burping, or sitting straight up. States that he has never had pain like this before. The pain is global about his abdomen and not worse in one quadrant. Last BM was 1/8, and he typically has 1 every day. Not passing any flatus. Upon presentation in the ED he had a CT scan that was fairly normal. RUQ ultrasound showed a contracted gallbladder without signs of cholecystitis. WBC 11.7, lipase 23, AST 469, ALT 241, alk phos 205, total bilirubin 3.9. Hepatitis panel negative. Patient was started empirically on zosyn.  Today WBC down to 8.5, AST 182, ALT 197, alk phos 143, total bilirubin 5.3. Abdominal xray showed moderate colonic stool burden without evidence of perforation. He underwent EGD which showed a normal esophagus, no evidence of ulcer, large amount of food residue in the gastric fundus/gastroparesis.  General surgery asked to see.  PMH significant for COPD, osteoporosis, HLD, chronic pain Abdominal surgical history: none Smokes 1/3 PPD Drinks 2-3 alcoholic beverages monthly Employment: retired  ROS: Review of Systems  Constitutional: Positive for chills and fever.  HENT: Negative.   Eyes: Negative.   Respiratory: Negative.   Cardiovascular: Negative.   Gastrointestinal: Positive for abdominal pain, constipation, nausea and vomiting. Negative for diarrhea.  Genitourinary: Negative.   Musculoskeletal: Negative.   Skin: Negative.   Neurological: Negative.     All systems reviewed and otherwise negative except for as above  Family History  Problem Relation Age of Onset  . Heart  attack Mother   . Heart attack Father   . Stroke Sister   . Heart attack Brother   . Heart attack Sister     Past Medical History:  Diagnosis Date  . Anxiety and depression   . COPD (chronic obstructive pulmonary disease) ([place])   . GERD (gastroesophageal reflux disease)   . History of migraine headaches   . HTN (hypertension)   . Hyperlipemia   . Personal history of alcoholism ([place])   . Tobacco use   . Tubular adenoma 01/29/2008    Past Surgical History:  Procedure Laterality Date  . ANTERIOR FUSION CERVICAL SPINE  2005   two surgeries after two mechanical falls, per Dr. Donald Pore  . COLONOSCOPY W/ BIOPSIES  01/29/2008   Dr. Silvano Rusk  . [place]   correction for crossed-eyes  . [place]    Social History:  reports that he has been smoking cigarettes.  He has a 27.50 pack-year smoking history. he has never used smokeless tobacco. He reports that he drinks alcohol. He reports that he does not use drugs.  Allergies: No Known Allergies  Medications Prior to Admission  Medication Sig Dispense Refill  . acetaminophen (TYLENOL) 325 MG tablet Take 2 tablets (650 mg total) by mouth every 6 (six) hours as needed for mild pain or moderate pain. 60 tablet 0  . alendronate (FOSAMAX) 70 MG tablet Take 1 tablet (70 mg total) by mouth every 7 (seven) days. Take with a full glass of water on an empty stomach. 4 tablet 11  . aspirin EC 81 MG tablet Take 1 tablet (81 mg total) by mouth  daily. 150 tablet 2  . atorvastatin (LIPITOR) 20 MG tablet Take 1 tablet (20 mg total) by mouth daily. 30 tablet 3  . Diclofenac Sodium 3 % GEL Place 1 application onto the skin 2 (two) times daily. To affected area. 100 g 0  . furosemide (LASIX) 20 MG tablet Take 1 tablet (20 mg total) by mouth daily. For leg swelling. 30 tablet 11  . gabapentin (NEURONTIN) 300 MG capsule Take 1 capsule (300 mg total) by mouth 2 (two) times daily. 60 capsule 2  . HYDROcodone-acetaminophen (NORCO) 5-325  MG tablet Take 1 tablet by mouth every 6 (six) hours as needed for moderate pain. 30 tablet 0  . meloxicam (MOBIC) 7.5 MG tablet Take 7.5 mg by mouth daily.    . Multiple Vitamins-Minerals (CENTRUM SILVER ADULT 50+) TABS Take 1 tablet by mouth daily.    Marland Kitchen PROAIR HFA 108 (90 Base) MCG/ACT inhaler INHALE 2 PUFFS INTO THE LUNGS FOUR TIMES DAILY (Patient taking differently: INHALE 2 PUFFS INTO THE LUNGS FOUR TIMES DAILY AS NEEDED FOR SOB.) 8.5 g 6  . tiotropium (SPIRIVA) 18 MCG inhalation capsule Place 1 capsule (18 mcg total) into inhaler and inhale daily. 30 capsule 2  . tiZANidine (ZANAFLEX) 2 MG tablet Take 2 mg by mouth every 6 (six) hours as needed for muscle spasms.      Prior to Admission medications   Medication Sig Start Date End Date Taking? Authorizing Provider  acetaminophen (TYLENOL) 325 MG tablet Take 2 tablets (650 mg total) by mouth every 6 (six) hours as needed for mild pain or moderate pain. 06/25/17  Yes Waynetta Pean, PA-C  alendronate (FOSAMAX) 70 MG tablet Take 1 tablet (70 mg total) by mouth every 7 (seven) days. Take with a full glass of water on an empty stomach. 06/27/17 06/27/18 Yes Alphonzo Grieve, MD  aspirin EC 81 MG tablet Take 1 tablet (81 mg total) by mouth daily. 04/12/17 04/12/18 Yes Lorella Nimrod, MD  atorvastatin (LIPITOR) 20 MG tablet Take 1 tablet (20 mg total) by mouth daily. 10/03/17  Yes Lorella Nimrod, MD  Diclofenac Sodium 3 % GEL Place 1 application onto the skin 2 (two) times daily. To affected area. 06/25/17  Yes Waynetta Pean, PA-C  furosemide (LASIX) 20 MG tablet Take 1 tablet (20 mg total) by mouth daily. For leg swelling. 08/02/17 08/02/18 Yes Lorella Nimrod, MD  gabapentin (NEURONTIN) 300 MG capsule Take 1 capsule (300 mg total) by mouth 2 (two) times daily. 07/12/17  Yes Lorella Nimrod, MD  HYDROcodone-acetaminophen (NORCO) 5-325 MG tablet Take 1 tablet by mouth every 6 (six) hours as needed for moderate pain. 09/04/17  Yes Rice, Resa Miner, MD   meloxicam (MOBIC) 7.5 MG tablet Take 7.5 mg by mouth daily.   Yes [provider]  Multiple Vitamins-Minerals (CENTRUM SILVER ADULT 50+) TABS Take 1 tablet by mouth daily.   Yes [provider]  PROAIR HFA 108 (90 Base) MCG/ACT inhaler INHALE 2 PUFFS INTO THE LUNGS FOUR TIMES DAILY Patient taking differently: INHALE 2 PUFFS INTO THE LUNGS FOUR TIMES DAILY AS NEEDED FOR SOB. 07/22/17  Yes Lorella Nimrod, MD  tiotropium (SPIRIVA) 18 MCG inhalation capsule Place 1 capsule (18 mcg total) into inhaler and inhale daily. 02/20/17  Yes Lorella Nimrod, MD  tiZANidine (ZANAFLEX) 2 MG tablet Take 2 mg by mouth every 6 (six) hours as needed for muscle spasms.   Yes [provider]    Blood pressure 99/61, pulse (!) 101, temperature 98.1 F (36.7 C), temperature source  Oral, resp. rate 17, height '5\' 7"'$  (1.702 m), weight 169 lb 8.5 oz (76.9 kg), SpO2 94 %. Physical Exam: General: pleasant, WD/WN white male who is laying in bed in NAD but appears uncomfortable HEENT: head is normocephalic, atraumatic.  Sclera are noninjected.  Pupils equal and round.  Ears and nose without any masses or lesions.  Mouth is pink and moist. Dentition fair Heart: regular, rate, and rhythm.  No obvious murmurs, gallops, or rubs noted.  Palpable pedal pulses bilaterally Lungs: CTAB, no wheezes, rhonchi, or rales noted.  Respiratory effort nonlabored Abd: no incisions noted, soft, mild distension, +BS, no masses, hernias, or organomegaly. Moderate global tenderness with guarding. No focal tenderness. MS: all 4 extremities are symmetrical with no cyanosis, clubbing, or edema. Skin: warm and dry with no masses, lesions, or rashes Psych: A&Ox3 with an appropriate affect. Neuro: cranial nerves grossly intact, extremity CSM intact bilaterally, normal speech  Results for orders placed or performed during the hospital encounter of 10/14/17 (from the past 48 hour(s))  Lipase, blood     Status: None   Collection  Time: 10/14/17  5:12 AM  Result Value Ref Range   Lipase 23 11 - 51 U/L  Comprehensive metabolic panel     Status: Abnormal   Collection Time: 10/14/17  5:12 AM  Result Value Ref Range   Sodium 137 135 - 145 mmol/L   Potassium 3.9 3.5 - 5.1 mmol/L   Chloride 102 101 - 111 mmol/L   CO2 24 22 - 32 mmol/L   Glucose, Bld 154 (H) 65 - 99 mg/dL   BUN 9 6 - 20 mg/dL   Creatinine, Ser 1.08 0.61 - 1.24 mg/dL   Calcium 9.2 8.9 - 10.3 mg/dL   Total Protein 7.3 6.5 - 8.1 g/dL   Albumin 3.9 3.5 - 5.0 g/dL   AST 469 (H) 15 - 41 U/L   ALT 241 (H) 17 - 63 U/L   Alkaline Phosphatase 205 (H) 38 - 126 U/L   Total Bilirubin 3.9 (H) 0.3 - 1.2 mg/dL   GFR calc non Af Amer >60 >60 mL/min   GFR calc Af Amer >60 >60 mL/min    Comment: (NOTE) The eGFR has been calculated using the CKD EPI equation. This calculation has not been validated in all clinical situations. eGFR's persistently <60 mL/min signify possible Chronic Kidney Disease.    Anion gap 11 5 - 15  CBC     Status: Abnormal   Collection Time: 10/14/17  5:12 AM  Result Value Ref Range   WBC 11.7 (H) 4.0 - 10.5 K/uL   RBC 4.48 4.22 - 5.81 MIL/uL   Hemoglobin 14.3 13.0 - 17.0 g/dL   HCT 42.5 39.0 - 52.0 %   MCV 94.9 78.0 - 100.0 fL   MCH 31.9 26.0 - 34.0 pg   MCHC 33.6 30.0 - 36.0 g/dL   RDW 12.6 11.5 - 15.5 %   Platelets 173 150 - 400 K/uL  Urinalysis, Routine w reflex microscopic     Status: Abnormal   Collection Time: 10/14/17  5:12 AM  Result Value Ref Range   Color, Urine AMBER (A) YELLOW    Comment: BIOCHEMICALS MAY BE AFFECTED BY COLOR   APPearance CLEAR CLEAR   Specific Gravity, Urine 1.016 1.005 - 1.030   pH 7.0 5.0 - 8.0   Glucose, UA NEGATIVE NEGATIVE mg/dL   Hgb urine dipstick NEGATIVE NEGATIVE   Bilirubin Urine NEGATIVE NEGATIVE   Ketones, ur 5 (A) NEGATIVE mg/dL  Protein, ur NEGATIVE NEGATIVE mg/dL   Nitrite NEGATIVE NEGATIVE   Leukocytes, UA NEGATIVE NEGATIVE  Hepatitis panel, acute     Status: None   Collection  Time: 10/14/17  2:04 PM  Result Value Ref Range   Hepatitis B Surface Ag Negative Negative   HCV Ab <0.1 0.0 - 0.9 s/co ratio    Comment: (NOTE)                                  Negative:     < 0.8                             Indeterminate: 0.8 - 0.9                                  Positive:     > 0.9 The CDC recommends that a positive HCV antibody result be followed up with a HCV Nucleic Acid Amplification test (009381). Performed At: [facility] [place], [place] [id number] Rush Farmer MD CV:[id number]    Hep A IgM Negative Negative   Hep B C IgM Negative Negative  Comprehensive metabolic panel     Status: Abnormal   Collection Time: 10/15/17  2:50 AM  Result Value Ref Range   Sodium 135 135 - 145 mmol/L   Potassium 3.4 (L) 3.5 - 5.1 mmol/L   Chloride 103 101 - 111 mmol/L   CO2 22 22 - 32 mmol/L   Glucose, Bld 81 65 - 99 mg/dL   BUN 13 6 - 20 mg/dL   Creatinine, Ser 1.26 (H) 0.61 - 1.24 mg/dL   Calcium 7.4 (L) 8.9 - 10.3 mg/dL   Total Protein 6.0 (L) 6.5 - 8.1 g/dL   Albumin 2.8 (L) 3.5 - 5.0 g/dL   AST 182 (H) 15 - 41 U/L   ALT 197 (H) 17 - 63 U/L   Alkaline Phosphatase 143 (H) 38 - 126 U/L   Total Bilirubin 5.3 (H) 0.3 - 1.2 mg/dL   GFR calc non Af Amer 56 (L) >60 mL/min   GFR calc Af Amer >60 >60 mL/min    Comment: (NOTE) The eGFR has been calculated using the CKD EPI equation. This calculation has not been validated in all clinical situations. eGFR's persistently <60 mL/min signify possible Chronic Kidney Disease.    Anion gap 10 5 - 15  CBC     Status: Abnormal   Collection Time: 10/15/17  2:50 AM  Result Value Ref Range   WBC 8.5 4.0 - 10.5 K/uL   RBC 3.97 (L) 4.22 - 5.81 MIL/uL   Hemoglobin 12.8 (L) 13.0 - 17.0 g/dL   HCT 38.2 (L) 39.0 - 52.0 %   MCV 96.2 78.0 - 100.0 fL   MCH 32.2 26.0 - 34.0 pg   MCHC 33.5 30.0 - 36.0 g/dL   RDW 13.0 11.5 - 15.5 %   Platelets 105 (L) 150 - 400 K/uL    Comment: SPECIMEN CHECKED FOR  CLOTS REPEATED TO VERIFY PLATELET COUNT CONFIRMED BY SMEAR   Lipid panel     Status: Abnormal   Collection Time: 10/15/17  2:50 AM  Result Value Ref Range   Cholesterol 88 0 - 200 mg/dL   Triglycerides 72 <150 mg/dL   HDL 24 (L) >40 mg/dL   Total CHOL/HDL  Ratio 3.7 RATIO   VLDL 14 0 - 40 mg/dL   LDL Cholesterol 50 0 - 99 mg/dL    Comment:        Total Cholesterol/HDL:CHD Risk Coronary Heart Disease Risk Table                     Men   Women  1/2 Average Risk   3.4   3.3  Average Risk       5.0   4.4  2 X Average Risk   9.6   7.1  3 X Average Risk  23.4   11.0        Use the calculated Patient Ratio above and the CHD Risk Table to determine the patient's CHD Risk.        ATP III CLASSIFICATION (LDL):  <100     mg/dL   Optimal  100-129  mg/dL   Near or Above                    Optimal  130-159  mg/dL   Borderline  160-189  mg/dL   High  >190     mg/dL   Very High   Lactate dehydrogenase     Status: Abnormal   Collection Time: 10/15/17  2:50 AM  Result Value Ref Range   LDH 275 (H) 98 - 192 U/L  Reticulocytes     Status: Abnormal   Collection Time: 10/15/17  2:50 AM  Result Value Ref Range   Retic Ct Pct 1.5 0.4 - 3.1 %   RBC. 3.93 (L) 4.22 - 5.81 MIL/uL   Retic Count, Absolute 59.0 19.0 - 186.0 K/uL  Save smear     Status: None   Collection Time: 10/15/17  2:50 AM  Result Value Ref Range   Smear Review SMEAR STAINED AND AVAILABLE FOR REVIEW   Direct antiglobulin test (not at [facility])     Status: None   Collection Time: 10/15/17  6:37 AM  Result Value Ref Range   DAT, complement NEG    DAT, IgG NEG    Ct Abdomen Pelvis W Contrast  Result Date: 10/14/2017 CLINICAL DATA:  Generalized abdominal pain and vomiting since last night. EXAM: CT ABDOMEN AND PELVIS WITH CONTRAST TECHNIQUE: Multidetector CT imaging of the abdomen and pelvis was performed using the standard protocol following bolus administration of intravenous contrast. CONTRAST:  191m ISOVUE-300 IOPAMIDOL  (ISOVUE-300) INJECTION 61% COMPARISON:  Plain films of earlier today. Most recent CT 03/23/2016. FINDINGS: Lower chest: Clear lung bases. Normal heart size without pericardial or pleural effusion. prominent periesophageal node measures 10 mm on image 2/series 3 and is similar to the 2017 exam, favoring a benign/ reactive etiology Hepatobiliary: Normal liver. Partially contracted gallbladder. No calcified stones. No biliary duct dilatation. Pancreas: Mild pancreatic atrophy, without duct dilatation or dominant mass. Spleen: Normal in size, without focal abnormality. Adrenals/Urinary Tract: Normal adrenal glands. 3 mm upper pole left renal collecting system calculus. Bilateral too small to characterize renal lesions. A 2.0 cm lower pole right renal cyst. Normal urinary bladder. Stomach/Bowel: Normal stomach, without wall thickening. Normal colon and terminal ileum. Normal small bowel. Normal appendix, including on coronal image 74. Vascular/Lymphatic: Aortic and branch vessel atherosclerosis. Retroaortic left renal vein. No abdominopelvic adenopathy. Reproductive: Mild prostatomegaly. Other: No significant free fluid. Musculoskeletal: Moderate compression deformity of T12 is similar to on the prior exam. There is an inferior endplate L4 compression deformity which is mild-to-moderate, new since the prior. IMPRESSION: 1.  No acute process in the abdomen or pelvis. 2.  Aortic Atherosclerosis (ICD10-I70.0). 3. Chronic T12 and new L4 compression deformities. 4. Left nephrolithiasis. Electronically Signed   By: Abigail Miyamoto M.D.   On: 10/14/2017 07:59   Dg Abd 2 Views  Result Date: 10/15/2017 CLINICAL DATA:  Abdominal distension, small-bowel obstruction EXAM: ABDOMEN - 2 VIEW COMPARISON:  Abdominal and pelvic CT scan and abdominal series of October 14, 2016. FINDINGS: There is a moderate amount of stool and gas within the colon. No free extraluminal gas collections are observed. There degenerative changes of the lower  lumbar spine. No abnormal soft tissue calcifications are observed. IMPRESSION: Moderately increased colonic stool burden. No evidence of perforation. Electronically Signed   By: David  Martinique M.D.   On: 10/15/2017 11:01   Dg Abd Portable 1 View  Result Date: 10/14/2017 CLINICAL DATA:  Abdominal pain and vomiting. EXAM: PORTABLE ABDOMEN - 1 VIEW COMPARISON:  None. FINDINGS: Upright views of the abdomen demonstrate no evidence of free air. Air-filled normal caliber small bowel in the right upper quadrant. Small stool burden in the upper abdomen. Subsegmental atelectasis at the left lung base. IMPRESSION: No free air.  No evidence of bowel obstruction. Electronically Signed   By: Jeb Levering M.D.   On: 10/14/2017 06:46   US Abdomen Limited Ruq  Result Date: 10/14/2017 CLINICAL DATA:  Elevated LFTs EXAM: ULTRASOUND ABDOMEN LIMITED RIGHT UPPER QUADRANT COMPARISON:  CT 10/14/2017 FINDINGS: Gallbladder: Contracted, not well visualized. No visible stones. No wall thickening or pericholecystic fluid. Common bile duct: Diameter: Normal caliber, 1 mm Liver: No focal lesion identified. Within normal limits in parenchymal echogenicity. Portal vein is patent on color Doppler imaging with normal direction of blood flow towards the liver. IMPRESSION: Gallbladder is contracted and not well visualized. No visible stones or wall thickening. No biliary duct dilatation. Electronically Signed   By: Rolm Baptise M.D.   On: 10/14/2017 11:48      Assessment/Plan COPD Migraines HLD Chronic low back pain  Acute abdominal pain, nausea, vomiting Constipation Transaminitis, Hyperbilirubinemia - CT scan fairly normal - RUQ ultrasound shows a contracted gallbladder without signs of cholecystitis.  - on arrival WBC 11.7, lipase 23, AST 469, ALT 241, alk phos 205, total bilirubin 3.9 - Hepatitis panel negative - Today WBC down to 8.5, AST 182, ALT 197, alk phos 143, total bilirubin 5.3. - Abdominal xray shows moderate  colonic stool burden without evidence of perforation.  - EGD showed a normal esophagus, no evidence of ulcer, large amount of food residue in the gastric fundus/gastroparesis.   Plan - Patient with abdominal pain unexplained by CT scan, u/s, or upper endoscopy. He does have a moderate colonic stool burden on xray. Will start by treating constipation with enemas. This does not explain his transaminitis or hyperbilirubinemia; continue workup per GI. If pain unchanged once constipation resolves could consider HIDA scan to rule out cholecystitis.  Will continue to follow.  Wellington Hampshire, [facility] Surgery 10/15/2017, 1:27 PM Pager: [phone] Consults: [phone] Mon-Fri 7:00 am-4:30 pm Sat-Sun 7:00 am-11:30 am

## 2017-10-15 NOTE — Transfer of Care (Signed)
Immediate Anesthesia Transfer of Care Note  Patient: [name]  Procedure(s) Performed: ESOPHAGOGASTRODUODENOSCOPY (EGD) (N/A )  Patient Location: Endoscopy Unit  Anesthesia Type:MAC  Level of Consciousness: awake, alert  and oriented  Airway & Oxygen Therapy: Patient Spontanous Breathing  Post-op Assessment: Report given to RN  Post vital signs: Reviewed and stable  Last Vitals:  Vitals:   10/15/17 0958 10/15/17 1110  BP:  97/60  Pulse:  98  Resp:  13  Temp:  36.7 C  SpO2: 93% 95%    Last Pain:  Vitals:   10/15/17 1110  TempSrc: Oral  PainSc: 8       Patients Stated Pain Goal: 4 (46/65/99 3570)  Complications: No apparent anesthesia complications

## 2017-10-16 ENCOUNTER — Inpatient Hospital Stay (HOSPITAL_COMMUNITY): Payer: Medicare Other

## 2017-10-16 DIAGNOSIS — E669 Obesity, unspecified: Secondary | ICD-10-CM

## 2017-10-16 DIAGNOSIS — M545 Low back pain: Secondary | ICD-10-CM

## 2017-10-16 DIAGNOSIS — R933 Abnormal findings on diagnostic imaging of other parts of digestive tract: Secondary | ICD-10-CM

## 2017-10-16 DIAGNOSIS — E876 Hypokalemia: Secondary | ICD-10-CM

## 2017-10-16 DIAGNOSIS — G43909 Migraine, unspecified, not intractable, without status migrainosus: Secondary | ICD-10-CM

## 2017-10-16 DIAGNOSIS — R7989 Other specified abnormal findings of blood chemistry: Secondary | ICD-10-CM

## 2017-10-16 DIAGNOSIS — B179 Acute viral hepatitis, unspecified: Secondary | ICD-10-CM

## 2017-10-16 DIAGNOSIS — D696 Thrombocytopenia, unspecified: Secondary | ICD-10-CM | POA: Diagnosis present

## 2017-10-16 DIAGNOSIS — D649 Anemia, unspecified: Secondary | ICD-10-CM

## 2017-10-16 LAB — COMPREHENSIVE METABOLIC PANEL
ALT: 112 U/L — ABNORMAL HIGH (ref 17–63)
AST: 82 U/L — ABNORMAL HIGH (ref 15–41)
Albumin: 2.4 g/dL — ABNORMAL LOW (ref 3.5–5.0)
Alkaline Phosphatase: 105 U/L (ref 38–126)
Anion gap: 7 (ref 5–15)
BUN: 13 mg/dL (ref 6–20)
CO2: 19 mmol/L — ABNORMAL LOW (ref 22–32)
Calcium: 7.2 mg/dL — ABNORMAL LOW (ref 8.9–10.3)
Chloride: 109 mmol/L (ref 101–111)
Creatinine, Ser: 1.13 mg/dL (ref 0.61–1.24)
GFR calc Af Amer: >60 mL/min (ref >60)
GFR calc non Af Amer: >60 mL/min (ref >60)
Glucose, Bld: 90 mg/dL (ref 65–99)
Potassium: 3.1 mmol/L — ABNORMAL LOW (ref 3.5–5.1)
Sodium: 135 mmol/L (ref 135–145)
Total Bilirubin: 5 mg/dL — ABNORMAL HIGH (ref 0.3–1.2)
Total Protein: 5.8 g/dL — ABNORMAL LOW (ref 6.5–8.1)

## 2017-10-16 LAB — CBC
HCT: 33.4 % — ABNORMAL LOW (ref 39.0–52.0)
Hemoglobin: 11.4 g/dL — ABNORMAL LOW (ref 13.0–17.0)
MCH: 32.5 pg (ref 26.0–34.0)
MCHC: 34.1 g/dL (ref 30.0–36.0)
MCV: 95.2 fL (ref 78.0–100.0)
Platelets: 79 10*3/uL — ABNORMAL LOW (ref 150–400)
RBC: 3.51 MIL/uL — ABNORMAL LOW (ref 4.22–5.81)
RDW: 13.2 % (ref 11.5–15.5)
WBC: 6.6 10*3/uL (ref 4.0–10.5)

## 2017-10-16 LAB — HAPTOGLOBIN: Haptoglobin: 102 mg/dL (ref 34–200)

## 2017-10-16 MED ORDER — ORAL CARE MOUTH RINSE
15.0000 mL | Freq: Two times a day (BID) | OROMUCOSAL | Status: DC
  Administered 2017-10-17 – 2017-10-22 (×8): 15 mL via OROMUCOSAL

## 2017-10-16 MED ORDER — POTASSIUM CHLORIDE 10 MEQ/100ML IV SOLN
10.0000 meq | Freq: Once | INTRAVENOUS | Status: AC
  Administered 2017-10-16: 10 meq via INTRAVENOUS
  Filled 2017-10-16: qty 100

## 2017-10-16 MED ORDER — KETOROLAC TROMETHAMINE 15 MG/ML IJ SOLN
15.0000 mg | Freq: Once | INTRAMUSCULAR | Status: AC
  Administered 2017-10-16: 15 mg via INTRAVENOUS
  Filled 2017-10-16: qty 1

## 2017-10-16 MED ORDER — MILK AND MOLASSES ENEMA
1.0000 | Freq: Once | RECTAL | Status: AC
  Administered 2017-10-16: 250 mL via RECTAL
  Filled 2017-10-16: qty 250

## 2017-10-16 MED ORDER — PANTOPRAZOLE SODIUM 40 MG PO TBEC
40.0000 mg | DELAYED_RELEASE_TABLET | Freq: Every day | ORAL | Status: DC
  Administered 2017-10-17 – 2017-10-22 (×6): 40 mg via ORAL
  Filled 2017-10-16 (×6): qty 1

## 2017-10-16 MED ORDER — KCL IN DEXTROSE-NACL 30-5-0.45 MEQ/L-%-% IV SOLN
INTRAVENOUS | Status: DC
  Administered 2017-10-16 – 2017-10-21 (×11): via INTRAVENOUS
  Filled 2017-10-16 (×14): qty 1000

## 2017-10-16 MED ORDER — CHLORHEXIDINE GLUCONATE 0.12 % MT SOLN
15.0000 mL | Freq: Two times a day (BID) | OROMUCOSAL | Status: DC
  Administered 2017-10-16 – 2017-10-22 (×12): 15 mL via OROMUCOSAL
  Filled 2017-10-16 (×11): qty 15

## 2017-10-16 MED ORDER — CHLORPROMAZINE HCL 25 MG PO TABS
25.0000 mg | ORAL_TABLET | Freq: Three times a day (TID) | ORAL | Status: DC | PRN
  Administered 2017-10-16: 25 mg via ORAL
  Filled 2017-10-16 (×3): qty 1

## 2017-10-16 MED ORDER — POTASSIUM CHLORIDE 10 MEQ/100ML IV SOLN
10.0000 meq | INTRAVENOUS | Status: AC
  Administered 2017-10-16 (×5): 10 meq via INTRAVENOUS
  Filled 2017-10-16 (×5): qty 100

## 2017-10-16 NOTE — Progress Notes (Signed)
[facility] Progress Note  1 Day Post-Op  Subjective: CC-  Patient states that his abdominal pain is unchanged. He still feels bloated, nauseated, and has vomited. Pain is global about his abdomen, points in lower abdomen when asked where his worse pain is located.  Received enema x2 yesterday, patient states he had a BM but he and RN unsure size of BM. No flatus this AM. LFTs slightly down today but still elevated, AST 82, ALT 112, alk phos 105, total bilirubin 5.0. WBC 6.6, TMAX 99.1.  Objective: Vital signs in last 24 hours: Temp:  [97.8 F (36.6 C)-99.1 F (37.3 C)] 99.1 F (37.3 C) (01/09 0439) Pulse Rate:  [97-111] 97 (01/09 0439) Resp:  [13-21] 18 (01/09 0439) BP: (94-121)/(60-87) 121/87 (01/09 0439) SpO2:  [90 %-96 %] 95 % (01/09 0439) Last BM Date: 10/14/17  Intake/Output from previous day: 01/08 0701 - 01/09 0700 In: 4382.5 [P.O.:900; I.V.:3432.5; IV Piggyback:50] Out: 2353 [Urine:2550; Emesis/NG output:4] Intake/Output this shift: Total I/O In: 3 [I.V.:3] Out: -   PE: Gen:  Alert, NAD, cooperative HEENT: EOM's intact, pupils equal and round Card:  RRR, no M/G/R heard Pulm:  effort normal Abd: soft, distended, +BS, no masses, hernias, or organomegaly. Moderate global tenderness with guarding.  Ext:  Calves soft and nontender Psych: A&Ox3  Skin: no rashes noted, warm and dry  Lab Results:  Recent Labs    10/15/17 0250 10/16/17 0237  WBC 8.5 6.6  HGB 12.8* 11.4*  HCT 38.2* 33.4*  PLT 105* 79*   BMET Recent Labs    10/15/17 0250 10/16/17 0237  NA 135 135  K 3.4* 3.1*  CL 103 109  CO2 22 19*  GLUCOSE 81 90  BUN 13 13  CREATININE 1.26* 1.13  CALCIUM 7.4* 7.2*   PT/INR No results for input(s): LABPROT, INR in the last 72 hours. CMP     Component Value Date/Time   NA 135 10/16/2017 0237   NA 141 09/04/2017 1341   K 3.1 (L) 10/16/2017 0237   CL 109 10/16/2017 0237   CO2 19 (L) 10/16/2017 0237   GLUCOSE 90 10/16/2017 0237    BUN 13 10/16/2017 0237   BUN 8 09/04/2017 1341   CREATININE 1.13 10/16/2017 0237   CREATININE 0.93 11/11/2012 1214   CALCIUM 7.2 (L) 10/16/2017 0237   PROT 5.8 (L) 10/16/2017 0237   PROT 7.3 08/13/2016 1459   ALBUMIN 2.4 (L) 10/16/2017 0237   ALBUMIN 4.3 08/13/2016 1459   AST 82 (H) 10/16/2017 0237   ALT 112 (H) 10/16/2017 0237   ALKPHOS 105 10/16/2017 0237   BILITOT 5.0 (H) 10/16/2017 0237   BILITOT 0.3 08/13/2016 1459   GFRNONAA >60 10/16/2017 0237   GFRNONAA 85 11/11/2012 1214   GFRAA >60 10/16/2017 0237   GFRAA >89 11/11/2012 1214   Lipase     Component Value Date/Time   LIPASE 17 10/15/2017 0647       Studies/Results: Dg Abd 2 Views  Result Date: 10/15/2017 CLINICAL DATA:  Abdominal distension, small-bowel obstruction EXAM: ABDOMEN - 2 VIEW COMPARISON:  Abdominal and pelvic CT scan and abdominal series of October 14, 2016. FINDINGS: There is a moderate amount of stool and gas within the colon. No free extraluminal gas collections are observed. There degenerative changes of the lower lumbar spine. No abnormal soft tissue calcifications are observed. IMPRESSION: Moderately increased colonic stool burden. No evidence of perforation. Electronically Signed   By: David  Martinique M.D.   On: 10/15/2017 11:01   US Abdomen  Limited Ruq  Result Date: 10/14/2017 CLINICAL DATA:  Elevated LFTs EXAM: ULTRASOUND ABDOMEN LIMITED RIGHT UPPER QUADRANT COMPARISON:  CT 10/14/2017 FINDINGS: Gallbladder: Contracted, not well visualized. No visible stones. No wall thickening or pericholecystic fluid. Common bile duct: Diameter: Normal caliber, 1 mm Liver: No focal lesion identified. Within normal limits in parenchymal echogenicity. Portal vein is patent on color Doppler imaging with normal direction of blood flow towards the liver. IMPRESSION: Gallbladder is contracted and not well visualized. No visible stones or wall thickening. No biliary duct dilatation. Electronically Signed   By: Rolm Baptise M.D.    On: 10/14/2017 11:48    Anti-infectives: Anti-infectives (From admission, onward)   Start     Dose/Rate Route Frequency Ordered Stop   10/15/17 0615  piperacillin-tazobactam (ZOSYN) IVPB 3.375 g     3.375 g 12.5 mL/hr over 240 Minutes Intravenous Every 8 hours 10/15/17 0610     10/14/17 1145  piperacillin-tazobactam (ZOSYN) IVPB 3.375 g     3.375 g 100 mL/hr over 30 Minutes Intravenous  Once 10/14/17 1145 10/14/17 1229       Assessment/Plan COPD Migraines HLD Chronic low back pain  Abdominal pain, nausea, vomiting Constipation Transaminitis, Hyperbilirubinemia - CT scan 1/7 fairly normal - RUQ ultrasound 1/7 shows a contracted gallbladder without signs of cholecystitis.  - on arrival WBC 11.7, lipase 23, AST 469, ALT 241, alk phos 205, total bilirubin 3.9 - Hepatitis panel negative - Today WBC 6.6, AST 82, ALT 112, alk phos 105, total bilirubin 5.0 - Abdominal xray 1/8 shows moderate colonic stool burden without evidence of perforation.  - EGD 1/8 showed a normal esophagus, no evidence of ulcer, large amount of food residue in the gastric fundus/gastroparesis.   ID - zosyn 1/7>> FEN - IVF, NPO/NGT VTE - SCDs, lovenox  Plan - Place NG tube for abdominal distension and n/v. Continue transaminitis and hyperbilirubinemia workup per GI. Will continue to follow.  Addendum: After enema this morning patient had a moderate BM. He has not vomited for several hours. Hold on NG tube unless patient vomits again.   LOS: 2 days    Wellington Hampshire , [facility] 10/16/2017, 9:46 AM Pager: [phone] Consults: [phone] Mon-Fri 7:00 am-4:30 pm Sat-Sun 7:00 am-11:30 am

## 2017-10-16 NOTE — Progress Notes (Signed)
Milk and molasses enema given at 0950. Patient tolerated well, had one medium bowel movement and one small bowel movement shortly after. Still has abdominal pain and distention but no more vomiting. Order present to insert NG tube, spoke to Dr. Ileene Rubens who stated to hold off inserting NG tube and to notify her if patient has additional vomiting episode. Will continue to monitor.

## 2017-10-16 NOTE — Consult Note (Signed)
            [facility] CM Primary Care Navigator  10/16/2017  [name] 05/16/1946 [id number]   Wentto see patient at the bedsideto identify possible discharge needs. Patientreports having "severe abdominal pain and distention" thatresultedto this admission.  Patientend[facility]   [facility] [address] [facility].   Patient reportsthathehas beenmanaginghisownmedications at homestraight out of the bottle containers.  Patient statesthat he does not drive but his friend [name] or his pastor [name] has been providingtransportation to hisdoctors'appointments.  Patient verbalized thathe lives alone and independent with self care prior to admission. Patient states that he is the primary caregiver for himself and has nobody to assist him at home except for the help of his friend or his pastor. He states that he has a brother who lives in [place], [place] (not getting along).  Anticipateddischarge planis still to be determined.  According to patient, discharge disposition is pending further plans for treatment interventions.  Patientvoiced understanding to call primary care provider's office when hereturnsback home,for a post discharge follow-up within1-2weeksor sooner if needs arise.Patient letter (with PCP's contact number) was provided ashis reminder.   Discussed with patientregarding [facility] CM services available for health management and resources at home and he was interested about it. Patient mentioned being able to manage his COPD so far with breathing medications and follow-up with primary care provider when needed.   Patient voiced understanding to seekreferral from primary care provider to [facility] care management if deemednecessaryand appropriate for services in the near  future.  [facility] care management information provided for future needs that he may have.   For additional questions please contact:  Edwena Felty A. Kastin Cerda, BSN, RN-BC [facility] PRIMARY CARE Navigator Cell: [phone]

## 2017-10-16 NOTE — Progress Notes (Signed)
  Date: 10/16/2017  Patient name: [name]  Medical record number: [id number]  Date of birth: 29-Dec-1945   I have seen and evaluated this patient and I have discussed the plan of care with the house staff. Please see their note for complete details. I concur with their findings with the following additions/corrections:   Mr. Swire continues to have significant diffuse abdominal pain and tenderness, although improved from yesterday's exam. He appears somewhat more comfortable. His abdomen is still distended, but softer than yesterday. He continues to have guarding on my exam. He has not had any further fevers, but continues to have intermittent sinus tachycardia.  His LFTs have improved from yesterday, although his bilirubin remains elevated at 5.0. Platelets are down to 79, and hemoglobin has dropped a little as well to 11.4, WBC is normal at 6.6. EGD yesterday was negative for pathology other than retained food in his stomach, and two-view abdomen x-ray showed moderate stool burden but no free air or other concerning findings.  Difficult to put his entire clinical picture together under one unifying diagnosis. It is difficult to attribute his significant pain and vomiting to constipation, especially with the sudden onset. Infection remains high on the differential, and it is possible he has a viral gastroenteritis with associated ileus and significant pain from the distention that has caused. Cholangitis remains on the differential given his fever, cholestatic liver panel, and elevated AST and ALTs, so we will continue him on Zosyn for now. We will check a GI pathogen panel to ensure we have not missed some intestinal infection, and we will also check CMV IgM, although CMV infection with endorgan damage is highly unusual in immunocompetent hosts.  We have reviewed his blood smear and overall case with hematology the smear is most strongly suggestive of an infectious process with resulting  thrombocytopenia. His other cell lines appear normal at this time, but we will continue to watch this closely. It is also possible the thrombocytopenia is related to previously undiagnosed underlying liver disease, although this seems unlikely as he he had normal platelets 3 months ago.  Oda Kilts, MD 10/16/2017, 5:53 PM

## 2017-10-16 NOTE — Progress Notes (Signed)
   Subjective: Patient continues to have uncontrolled abdominal pain. States that he had 2 bowel movements yesterday with the Fleet enemas. Initially he felt that his pain improved but now he does not feel that it has improved. He feels that his abdominal distention is the same as days prior. He was able to tolerate some juice and Jell-O yesterday without nausea or vomiting. He did have an episode of cough with pinkish sputum production yesterday evening. Otherwise no changes over the interval. Discussed that we are still looking for the definitive answer to his abdominal pain and distention. We will continue with enemas, IV hydration, and slowly advance his diet.  He is asking for additional pain medication. All questions and concerns addressed.  Objective: Vital signs in last 24 hours: Vitals:   10/15/17 1240 10/15/17 1329 10/15/17 2144 10/16/17 0439  BP: 99/61 98/68 103/67 121/87  Pulse: (!) 101 (!) 111 (!) 108 97  Resp: 17 18 18 18   Temp:  97.8 F (36.6 C) 99 F (37.2 C) 99.1 F (37.3 C)  TempSrc:  Oral Oral Oral  SpO2: 94% 90% 96% 95%  Weight:      Height:       General: Obese male in no acute distress Pulm: Good air movement with no wheezing or crackles CV: Tachycardic, no murmurs or rubs Abdomen: Hypoactive bowel sounds, distended but softer than the days prior, tenderness is decreased compared to prior exam, no rebound or guarding today Extremities: No lower extremity edema. Warm and dry.  Assessment/Plan:  Abdominal Pain. - Patient able to tolerate some PO intake yesterday evening and experienced 2 BMs. He feels that his abdominal pain and distention are unchanged compared to prior.  - Remains afebrile and hemodynamically stable for >24 hours - AM labs showing continued improvement in the patient's LFTs and bilirubin. Hgb and Plt again dropped but no signs of hemolysis based on additional lab work performed (Coombs, LDH, smear). - Continuing Fleet enemas and maintaining K >4.0   - Continuing Zosyn, will follow blood cultures  - Appreciate surgery and GI recommendations  Normocytic Anemia and Thrombocytopenia  - Based on available data, not likely due to hemolytic anemia  - Likely due to acute illness   Chronic Lumbar Back Pain  - Continuing Gabapentin  - Holding Meloxicam, Norco, and Tizanidine   Migraine Headaches  - Patient uses excessive amounts of NSAIDs for both his migraines and chronic back pain - Will consider prophylaxis with a beta blocker   COPD - Continue Spiriva with albuterol PRN  Hyperlipidemia  - Hold Atorvastatin but will resume on discharge  - LDL 50  Dispo: Anticipated discharge in approximately >1 day(s).   Ina Homes, MD 10/16/2017, 5:59 AM My Pager: [phone]

## 2017-10-16 NOTE — Progress Notes (Signed)
Patient coughing and spitting out pinkish  sputum. Dr Danford Bad informed. Now new orders recieved

## 2017-10-16 NOTE — Progress Notes (Signed)
Daily Rounding Note  10/16/2017, 10:15 AM  LOS: 2 days   SUBJECTIVE:   Chief complaint:  Ongoing c/o abd pain, n/v, hiccups.  Feels miserable.  Pain is diffuse across abdomen.  Surgery following and ordered NGT placed this AM and repeat KUB (not yet done).  Not yet in place.  Had "right good" amount of stool output after Fetts enema x 2 yesterday; received Milk/molasses enema this AM.   .     Zosyn day 3.    OBJECTIVE:         Vital signs in last 24 hours:    Temp:  [97.8 F (36.6 C)-99.1 F (37.3 C)] 99.1 F (37.3 C) (01/09 0439) Pulse Rate:  [97-111] 97 (01/09 0439) Resp:  [13-21] 18 (01/09 0439) BP: (94-121)/(60-87) 121/87 (01/09 0439) SpO2:  [90 %-96 %] 95 % (01/09 0439) Last BM Date: 10/14/17 Filed Weights   10/14/17 0510 10/14/17 1902  Weight: 78 kg (172 lb) 76.9 kg (169 lb 8.5 oz)   General: looks uncomfortable and unwell.     Heart: RRR Chest: clear bil.  No cough, no dyspnea. Abdomen: distended, soft, tender across mid to upper abdomen with + voluntary guarding.    Extremities: no CCE Neuro/Psych:  Weak but able to stand up with minor assist and transfer to bedside lounger.  Alert, oriented x 3.    Intake/Output from previous day: 01/08 0701 - 01/09 0700 In: 4382.5 [P.O.:900; I.V.:3432.5; IV Piggyback:50] Out: 6294 [Urine:2550; Emesis/NG output:4]  Intake/Output this shift: Total I/O In: 303 [P.O.:300; I.V.:3] Out: -   Lab Results: Recent Labs    10/14/17 0512 10/15/17 0250 10/16/17 0237  WBC 11.7* 8.5 6.6  HGB 14.3 12.8* 11.4*  HCT 42.5 38.2* 33.4*  PLT 173 105* 79*   BMET Recent Labs    10/14/17 0512 10/15/17 0250 10/16/17 0237  NA 137 135 135  K 3.9 3.4* 3.1*  CL 102 103 109  CO2 24 22 19*  GLUCOSE 154* 81 90  BUN '9 13 13  '$ CREATININE 1.08 1.26* 1.13  CALCIUM 9.2 7.4* 7.2*   LFT Recent Labs    10/14/17 0512 10/15/17 0250 10/16/17 0237  PROT 7.3 6.0* 5.8*  ALBUMIN 3.9 2.8*  2.4*  AST 469* 182* 82*  ALT 241* 197* 112*  ALKPHOS 205* 143* 105  BILITOT 3.9* 5.3* 5.0*   PT/INR No results for input(s): LABPROT, INR in the last 72 hours. Hepatitis Panel Recent Labs    10/14/17 1404  HEPBSAG Negative  HCVAB <0.1  HEPAIGM Negative  HEPBIGM Negative    Studies/Results: Dg Abd 2 Views  Result Date: 10/15/2017 CLINICAL DATA:  Abdominal distension, small-bowel obstruction EXAM: ABDOMEN - 2 VIEW COMPARISON:  Abdominal and pelvic CT scan and abdominal series of October 14, 2016. FINDINGS: There is a moderate amount of stool and gas within the colon. No free extraluminal gas collections are observed. There degenerative changes of the lower lumbar spine. No abnormal soft tissue calcifications are observed. IMPRESSION: Moderately increased colonic stool burden. No evidence of perforation. Electronically Signed   By: David  Martinique M.D.   On: 10/15/2017 11:01   US Abdomen Limited Ruq  Result Date: 10/14/2017 CLINICAL DATA:  Elevated LFTs EXAM: ULTRASOUND ABDOMEN LIMITED RIGHT UPPER QUADRANT COMPARISON:  CT 10/14/2017 FINDINGS: Gallbladder: Contracted, not well visualized. No visible stones. No wall thickening or pericholecystic fluid. Common bile duct: Diameter: Normal caliber, 1 mm Liver: No focal lesion identified. Within normal limits in parenchymal echogenicity.  Portal vein is patent on color Doppler imaging with normal direction of blood flow towards the liver. IMPRESSION: Gallbladder is contracted and not well visualized. No visible stones or wall thickening. No biliary duct dilatation. Electronically Signed   By: Rolm Baptise M.D.   On: 10/14/2017 11:48    ASSESMENT:   *   Abd pain, N/V.  Contracted GB on ultrasound.  Xray with constipation.   1/8 EGD: retained gastric contents.   Though has had BM  *  Hepatitis.  T bili rising but alk phos and AST/ALT improved.   Acute hepatitis panel negative.  Drinks a fair amount of malt liquor at home.    *  Chronic headaches,  excessive ASA and NSAID use PTA.  HA not currently an issue.    *  Hypokalemia.  IV runs x 6 ordered.    *  New onset thrombocytopenia.     PLAN   *  NGT placement per surgery.  Await results of yet to be completed portable abd xray. IVF at 150 ml/hour, will reduce to 125/hour and add change formula to D5 1/2 NS with 30 KCL at 125/hour.      *  CMET, CBC in AM.      Azucena Freed  10/16/2017, 10:15 AM Pager: [phone]  GI ATTENDING  Interval history data reviewed. Patient personally seen and examined. Agree with interval progress note as outlined. Patient did have modest results with enemas. However, states his problems with pain are about the same. He looks slightly more comfortable, to me, at rest. However, still with a distended abdomen with tenderness throughout with minimal palpation. Liver test abnormalities improving save bilirubin. Thrombocytopenia has progressed are not critical. Still not clear to me if we have a unifying diagnosis. IMPRESSION 1. Gastroparesis as evidenced by significant retained solid food on EGD. Otherwise normal EGD. This in and of itself would not explain generalized pain, liver test abnormalities, and thrombocytopenia 2. Question intestinal ileus with increased stool burden and gas on x-ray. No critical distention on x-ray yesterday. Follow-up x-ray today pending. If no significant gaseous distention of the small and large intestine, but still significant stool burden, could consider NG tube placement with slow gut purge with GoLYTELY or like substance 3. Elevated liver tests. Etiology not clear. Imaging with contracted gallbladder but otherwise negative. Hepatitis studies negative. Improving. No hepatic synthetic dysfunction. On empiric Zosyn 4. New onset progressive thrombocytopenia. Etiology unclear. Should be evaluated at this point RECOMMENDATIONS 1. Follow-up x-rays from today 2. Consider NG tube with SLOW gut purge 3. Continue empiric antibiotics for  now 4. Please ask hematology to see him regarding new onset progressive thrombocytopenia. Question drug reaction. Question related to his clinical presentation. Other? 5. Appreciate surgery following given the degree of abdominal discomfort and lack of clear diagnosis  Kirill Chatterjee N. Geri Seminole., M.D. [facility] Division of Gastroenterology

## 2017-10-17 ENCOUNTER — Inpatient Hospital Stay (HOSPITAL_COMMUNITY): Payer: Medicare Other

## 2017-10-17 ENCOUNTER — Encounter (HOSPITAL_COMMUNITY): Payer: Self-pay | Admitting: Internal Medicine

## 2017-10-17 DIAGNOSIS — J90 Pleural effusion, not elsewhere classified: Secondary | ICD-10-CM

## 2017-10-17 DIAGNOSIS — J439 Emphysema, unspecified: Secondary | ICD-10-CM

## 2017-10-17 DIAGNOSIS — E873 Alkalosis: Secondary | ICD-10-CM | POA: Diagnosis not present

## 2017-10-17 DIAGNOSIS — R9389 Abnormal findings on diagnostic imaging of other specified body structures: Secondary | ICD-10-CM

## 2017-10-17 DIAGNOSIS — J690 Pneumonitis due to inhalation of food and vomit: Secondary | ICD-10-CM

## 2017-10-17 DIAGNOSIS — R590 Localized enlarged lymph nodes: Secondary | ICD-10-CM

## 2017-10-17 DIAGNOSIS — R0603 Acute respiratory distress: Secondary | ICD-10-CM

## 2017-10-17 DIAGNOSIS — R918 Other nonspecific abnormal finding of lung field: Secondary | ICD-10-CM | POA: Diagnosis present

## 2017-10-17 DIAGNOSIS — A419 Sepsis, unspecified organism: Principal | ICD-10-CM

## 2017-10-17 LAB — BLOOD GAS, ARTERIAL
Acid-base deficit: 2.7 mmol/L — ABNORMAL HIGH (ref 0.0–2.0)
Bicarbonate: 20.3 mmol/L (ref 20.0–28.0)
Drawn by: 511851
O2 Content: 2 L/min
O2 Saturation: 96.4 %
Patient temperature: 98.6
pCO2 arterial: 27.4 mmHg — ABNORMAL LOW (ref 32.0–48.0)
pH, Arterial: 7.482 — ABNORMAL HIGH (ref 7.350–7.450)
pO2, Arterial: 75.5 mmHg — ABNORMAL LOW (ref 83.0–108.0)

## 2017-10-17 LAB — CBC
HCT: 33.6 % — ABNORMAL LOW (ref 39.0–52.0)
Hemoglobin: 11.8 g/dL — ABNORMAL LOW (ref 13.0–17.0)
MCH: 32.6 pg (ref 26.0–34.0)
MCHC: 35.1 g/dL (ref 30.0–36.0)
MCV: 92.8 fL (ref 78.0–100.0)
Platelets: 91 10*3/uL — ABNORMAL LOW (ref 150–400)
RBC: 3.62 MIL/uL — ABNORMAL LOW (ref 4.22–5.81)
RDW: 13 % (ref 11.5–15.5)
WBC: 5.6 10*3/uL (ref 4.0–10.5)

## 2017-10-17 LAB — COMPREHENSIVE METABOLIC PANEL
ALBUMIN: 2.2 g/dL — AB (ref 3.5–5.0)
ALT: 78 U/L — ABNORMAL HIGH (ref 17–63)
ANION GAP: 7 (ref 5–15)
AST: 50 U/L — ABNORMAL HIGH (ref 15–41)
Alkaline Phosphatase: 113 U/L (ref 38–126)
BILIRUBIN TOTAL: 6.8 mg/dL — AB (ref 0.3–1.2)
BUN: 9 mg/dL (ref 6–20)
CALCIUM: 7.6 mg/dL — AB (ref 8.9–10.3)
CO2: 21 mmol/L — ABNORMAL LOW (ref 22–32)
Chloride: 107 mmol/L (ref 101–111)
Creatinine, Ser: 1.11 mg/dL (ref 0.61–1.24)
GLUCOSE: 135 mg/dL — AB (ref 65–99)
POTASSIUM: 3.5 mmol/L (ref 3.5–5.1)
Sodium: 135 mmol/L (ref 135–145)
TOTAL PROTEIN: 5.7 g/dL — AB (ref 6.5–8.1)

## 2017-10-17 LAB — EXPECTORATED SPUTUM ASSESSMENT W GRAM STAIN, RFLX TO RESP C

## 2017-10-17 LAB — MAGNESIUM: Magnesium: 1.8 mg/dL (ref 1.7–2.4)

## 2017-10-17 LAB — DIFFERENTIAL
BASOS PCT: 0 %
Basophils Absolute: 0 10*3/uL (ref 0.0–0.1)
EOS PCT: 0 %
Eosinophils Absolute: 0 10*3/uL (ref 0.0–0.7)
LYMPHS PCT: 12 %
Lymphs Abs: 0.7 10*3/uL (ref 0.7–4.0)
Monocytes Absolute: 0.2 10*3/uL (ref 0.1–1.0)
Monocytes Relative: 4 %
NEUTROS ABS: 4.6 10*3/uL (ref 1.7–7.7)
Neutrophils Relative %: 84 %

## 2017-10-17 LAB — PROTIME-INR
INR: 1.09
Prothrombin Time: 14.1 seconds (ref 11.4–15.2)

## 2017-10-17 LAB — LACTATE DEHYDROGENASE: LDH: 211 U/L — ABNORMAL HIGH (ref 98–192)

## 2017-10-17 LAB — SEDIMENTATION RATE: Sed Rate: 94 mm/hr — ABNORMAL HIGH (ref 0–16)

## 2017-10-17 LAB — LACTIC ACID, PLASMA: Lactic Acid, Venous: 1.4 mmol/L (ref 0.5–1.9)

## 2017-10-17 LAB — GAMMA GT: GGT: 276 U/L — ABNORMAL HIGH (ref 7–50)

## 2017-10-17 LAB — C-REACTIVE PROTEIN: CRP: 22.9 mg/dL — ABNORMAL HIGH (ref <1.0)

## 2017-10-17 MED ORDER — ALBUTEROL SULFATE (2.5 MG/3ML) 0.083% IN NEBU
2.5000 mg | INHALATION_SOLUTION | Freq: Four times a day (QID) | RESPIRATORY_TRACT | Status: DC
  Administered 2017-10-17 – 2017-10-22 (×20): 2.5 mg via RESPIRATORY_TRACT
  Filled 2017-10-17 (×20): qty 3

## 2017-10-17 MED ORDER — POTASSIUM CHLORIDE CRYS ER 20 MEQ PO TBCR
40.0000 meq | EXTENDED_RELEASE_TABLET | Freq: Once | ORAL | Status: AC
  Administered 2017-10-17: 40 meq via ORAL
  Filled 2017-10-17: qty 2

## 2017-10-17 MED ORDER — AZITHROMYCIN 500 MG PO TABS
250.0000 mg | ORAL_TABLET | Freq: Every day | ORAL | Status: DC
  Administered 2017-10-18: 250 mg via ORAL
  Filled 2017-10-17: qty 1

## 2017-10-17 MED ORDER — IOPAMIDOL (ISOVUE-300) INJECTION 61%
INTRAVENOUS | Status: AC
  Administered 2017-10-17: 100 mL
  Filled 2017-10-17: qty 100

## 2017-10-17 MED ORDER — VANCOMYCIN HCL 10 G IV SOLR
1250.0000 mg | Freq: Once | INTRAVENOUS | Status: AC
  Administered 2017-10-17: 1250 mg via INTRAVENOUS
  Filled 2017-10-17: qty 1250

## 2017-10-17 MED ORDER — VANCOMYCIN HCL IN DEXTROSE 750-5 MG/150ML-% IV SOLN
750.0000 mg | Freq: Once | INTRAVENOUS | Status: AC
  Administered 2017-10-17: 750 mg via INTRAVENOUS
  Filled 2017-10-17: qty 150

## 2017-10-17 MED ORDER — VANCOMYCIN HCL IN DEXTROSE 750-5 MG/150ML-% IV SOLN
750.0000 mg | Freq: Two times a day (BID) | INTRAVENOUS | Status: DC
Start: 2017-10-18 — End: 2017-10-18
  Administered 2017-10-18 (×2): 750 mg via INTRAVENOUS
  Filled 2017-10-17 (×3): qty 150

## 2017-10-17 MED ORDER — IOPAMIDOL (ISOVUE-300) INJECTION 61%
INTRAVENOUS | Status: AC
  Filled 2017-10-17: qty 30

## 2017-10-17 MED ORDER — METOPROLOL TARTRATE 5 MG/5ML IV SOLN
5.0000 mg | Freq: Once | INTRAVENOUS | Status: AC | PRN
  Administered 2017-10-17: 5 mg via INTRAVENOUS
  Filled 2017-10-17: qty 5

## 2017-10-17 MED ORDER — AZITHROMYCIN 500 MG PO TABS
500.0000 mg | ORAL_TABLET | Freq: Once | ORAL | Status: AC
  Administered 2017-10-17: 500 mg via ORAL
  Filled 2017-10-17: qty 1

## 2017-10-17 MED ORDER — LACTATED RINGERS IV BOLUS (SEPSIS)
1000.0000 mL | Freq: Once | INTRAVENOUS | Status: AC
  Administered 2017-10-17: 1000 mL via INTRAVENOUS

## 2017-10-17 MED ORDER — VANCOMYCIN HCL IN DEXTROSE 750-5 MG/150ML-% IV SOLN
750.0000 mg | Freq: Two times a day (BID) | INTRAVENOUS | Status: DC
  Filled 2017-10-17: qty 150

## 2017-10-17 NOTE — Progress Notes (Signed)
Pt transported to Radiology for chest x ray.

## 2017-10-17 NOTE — Progress Notes (Signed)
Paged by RN that patient was now tachypneic with new oxygen demand (sats 87% on RA) and appeared worsened clinically from his earlier appearance upon return from CT. Went to evaluate the patient. He initially was very tachypneic with accessory muscle usage. His only complaints were continued abdominal pain. He did report feeling short of breath but no change from his previous. Discussed with Dr. Rebeca Alert and ordered ABG, lactate, and additional dose of vancomycin (started last night). Will add azithro as well. CT chest resulted back with large ground-glass consolidations. Upon re-assement, he was resting much more comfortably and reported he felt better on the oxygen. Tachypnea and work of breathing was improving. Will consult pulmonology as well. Appreciate their assistance.

## 2017-10-17 NOTE — Assessment & Plan Note (Deleted)
The differential diagnosis of bilateral groundglass opacities is broad -- too broad. This discussion will focus on the most likely candidates. His clinical history compels consideration of aspiration pneumonitis as the most likely cause of the patient's current bilateral groundglass opacities (I.e. In the absence of previous imaging that reveals similar findings). 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 These clinical history elements raise concern for infection with nontuberculous mycobacteria, MRSA, Aspergillus species, Pseudomonas, etc. He has bilateral groundglass opacities, right lower lobe traction bronchiectasis, and findings suspicious for early honeycombining in the bases (although, not with the classic subpleural lines). This constellation does warrant including usual interstitial pneumonitis/idiopathic pulmonary fibrosis in the differential diagnosis of an abnormal chest CT showing groundglass opacities.

## 2017-10-17 NOTE — Progress Notes (Signed)
   Subjective: Patient continues to have abdominal pain with movement. He was able to tolerate some PO intake yesterday without increased abdominal pain or N/V. He feels that he is only minimally improving. We discussed that his CXR illustrated signs consistent with PNA and we have started additional antibiotics. He has pink sputum at bedside and we discussed the need to collect it next time he is able to cough some up. Discussed the plan to continue antibiotic therapy and obtain additional imaging today. He agrees. All questions and concerns addressed.   Objective: Vital signs in last 24 hours: Vitals:   10/16/17 2114 10/17/17 0009 10/17/17 0219 10/17/17 0618  BP: (!) 145/79 116/65  (!) 130/59  Pulse: (!) 105 (!) 122 (!) 114 63  Resp: 18 20 20 19   Temp: 99.7 F (37.6 C) (!) 102.8 F (39.3 C) 99.6 F (37.6 C) (!) 97.4 F (36.3 C)  TempSrc: Oral Oral Oral Oral  SpO2: 100% 95% 96% 92%  Weight:      Height:       General: Obese male, diaphoretic  Pulm: Good air movement with faint inspiratory crackles in the lower lung fields  CV: Tachycardic with no murmurs or rubs heard Abdomen: Active bowel sound compared to yesterday, still has diffuse tenderness that appears to be worse in the suprapubic and LLQ with rebound and guarding  Extremities: No LE edema   Assessment/Plan:  Sepsis 2/2 Multifocal PNA - Patient having cough, febrile, and tachycardic. CXR illustrating bilateral upper lobe opacities consistent with multifocal PNA - Started on Vancomycin overnight  - Will obtain CT chest because CXR illustrated volume loss with elevation of the right middle fissure  - Blood cultures drawn on 1/8 have shown NGTD; however, due to fever last night repeat BC were obtained - It is possible that the patient has pneumonia leading to adynamic ileus. This would also explain the patient's acute anemia and thrombocytopenia. Will continue to monitor.    Abdominal Pain. - Patient is tolerating a little  PO intake without increased abdominal pain and N/V. He feels minimally improved but is still not having any bowel movements or flatulence  - LFT continue to normalize but bilirubin remains high. This is consistent with alcoholic hepatitis  - Repeat CT abdomen and pelvis with contrast today  - Maintaining K >4.0  - Continuing Zosyn, will follow blood cultures  - Appreciate surgery and GI recommendations  Normocytic Anemia and Thrombocytopenia  - Repeating CBC this AM - Based on available data, not likely due to hemolytic anemia  - Most likely due to acute infection   Chronic Lumbar Back Pain  - Continuing Gabapentin  - Holding Meloxicam, Norco, and Tizanidine   Migraine Headaches  - Patient uses excessive amounts of NSAIDs for both his migraines and chronic back pain - Will consider prophylaxis with a beta blocker   COPD - Continue Spiriva with albuterol PRN  Hyperlipidemia  - Hold Atorvastatin but will resume on discharge  -LDL 50  Dispo: Anticipated discharge in approximately >1 day(s).   Ina Homes, MD 10/17/2017, 6:39 AM My Pager: [phone]

## 2017-10-17 NOTE — Progress Notes (Signed)
Pt temp 102.8, Tylenol PRN given at 0012, temp rechecked 102.1, HR 122, RR 20.  At 0220 pt temp 01.7 after ice application.  MD aware, awaiting new orders.

## 2017-10-17 NOTE — Progress Notes (Signed)
[facility]/Trauma Progress Note  2 Days Post-Op   Assessment/Plan COPD Migraines HLD Chronic low back pain  Abdominal pain, nausea, vomiting Constipation Transaminitis, Hyperbilirubinemia -CT scan 1/7 fairly normal - RUQ ultrasound 1/7 shows a contracted gallbladder without signs of cholecystitis.  - on arrival WBC 11.7, lipase 23, AST 469, ALT 241, alk phos 205, total bilirubin 3.9 - Hepatitis panel negative - Today AST 50, ALT 78, alk phos 113, total bilirubin 6.8 - Abdominal xray 1/8 shows moderate colonic stool burden without evidence of perforation.  - EGD 1/8 showed a normal esophagus, no evidence of ulcer, large amount of food residue in the gastric fundus/gastroparesis.   ID - zosyn 1/7>>   Vancomycin 01/10>> FEN - IVF, NPO, clears VTE - SCDs, lovenox  Plan - No vomiting. Continue clears. Continue transaminitis and hyperbilirubinemia workup per GI. Will continue to follow.    LOS: 3 days    Subjective:  CC: abdominal pain and cough  Pt states worsening cough since admission. It is a productive cough with blood tinged sputum. Abdominal pain unchanged. He states his abdomen is less bloated. No additional BM since enema. No nausea or vomiting.   Objective: Vital signs in last 24 hours: Temp:  [97.4 F (36.3 C)-102.8 F (39.3 C)] 97.4 F (36.3 C) (01/10 0618) Pulse Rate:  [63-122] 63 (01/10 0618) Resp:  [18-20] 19 (01/10 0618) BP: (116-145)/(59-79) 130/59 (01/10 0618) SpO2:  [88 %-100 %] 88 % (01/10 0821) Last BM Date: 10/16/17  Intake/Output from previous day: 01/09 0701 - 01/10 0700 In: 2787.6 [P.O.:820; I.V.:1767.6; IV Piggyback:200] Out: 1875 [Urine:1875] Intake/Output this shift: No intake/output data recorded.  PE: Gen:  Alert, NAD, cooperative Pulm:  Rate and effort normal, productive cough with blood tinged sputum Abd: Soft, very mild distention, +BS, generalized TTP with guarding Skin: no rashes noted, warm and  dry   Anti-infectives: Anti-infectives (From admission, onward)   Start     Dose/Rate Route Frequency Ordered Stop   10/17/17 1800  vancomycin (VANCOCIN) IVPB 750 mg/150 ml premix     750 mg 150 mL/hr over 60 Minutes Intravenous Every 12 hours 10/17/17 0541     10/17/17 0600  vancomycin (VANCOCIN) 1,250 mg in sodium chloride 0.9 % 250 mL IVPB     1,250 mg 166.7 mL/hr over 90 Minutes Intravenous  Once 10/17/17 0541 10/17/17 0807   10/15/17 0615  piperacillin-tazobactam (ZOSYN) IVPB 3.375 g     3.375 g 12.5 mL/hr over 240 Minutes Intravenous Every 8 hours 10/15/17 0610     10/14/17 1145  piperacillin-tazobactam (ZOSYN) IVPB 3.375 g     3.375 g 100 mL/hr over 30 Minutes Intravenous  Once 10/14/17 1145 10/14/17 1229      Lab Results:  Recent Labs    10/15/17 0250 10/16/17 0237  WBC 8.5 6.6  HGB 12.8* 11.4*  HCT 38.2* 33.4*  PLT 105* 79*   BMET Recent Labs    10/16/17 0237 10/17/17 0333  NA 135 135  K 3.1* 3.5  CL 109 107  CO2 19* 21*  GLUCOSE 90 135*  BUN 13 9  CREATININE 1.13 1.11  CALCIUM 7.2* 7.6*   PT/INR Recent Labs    10/17/17 0333  LABPROT 14.1  INR 1.09   CMP     Component Value Date/Time   NA 135 10/17/2017 0333   NA 141 09/04/2017 1341   K 3.5 10/17/2017 0333   CL 107 10/17/2017 0333   CO2 21 (L) 10/17/2017 0333   GLUCOSE 135 (H) 10/17/2017  0333   BUN 9 10/17/2017 0333   BUN 8 09/04/2017 1341   CREATININE 1.11 10/17/2017 0333   CREATININE 0.93 11/11/2012 1214   CALCIUM 7.6 (L) 10/17/2017 0333   PROT 5.7 (L) 10/17/2017 0333   PROT 7.3 08/13/2016 1459   ALBUMIN 2.2 (L) 10/17/2017 0333   ALBUMIN 4.3 08/13/2016 1459   AST 50 (H) 10/17/2017 0333   ALT 78 (H) 10/17/2017 0333   ALKPHOS 113 10/17/2017 0333   BILITOT 6.8 (H) 10/17/2017 0333   BILITOT 0.3 08/13/2016 1459   GFRNONAA >60 10/17/2017 0333   GFRNONAA 85 11/11/2012 1214   GFRAA >60 10/17/2017 0333   GFRAA >89 11/11/2012 1214   Lipase     Component Value Date/Time   LIPASE 17  10/15/2017 0647    Studies/Results: Dg Chest 2 View  Result Date: 10/17/2017 CLINICAL DATA:  Fever EXAM: CHEST  2 VIEW COMPARISON:  Chest radiograph 06/25/2017 FINDINGS: There are bilateral upper lobe predominant opacities, worst in the right upper lobe. No pleural effusion or pneumothorax. Cardiomediastinal contours are unchanged. IMPRESSION: Bilateral upper lobe predominant opacities, worst in the right upper lobe. Findings are concerning for multifocal pneumonia. Electronically Signed   By: Ulyses Jarred M.D.   On: 10/17/2017 05:10   Dg Abd 1 View  Result Date: 10/16/2017 CLINICAL DATA:  Midline abdominal pain for 2-3 days. EXAM: ABDOMEN - 1 VIEW COMPARISON:  10/15/2017 FINDINGS: Moderate fecal retention within the right colon. No bowel obstruction or free air. No organomegaly or suspicious calculi. No acute osseous abnormality. IMPRESSION: Moderate fecal retention within the right colon. Electronically Signed   By: Ashley Royalty M.D.   On: 10/16/2017 20:52   Dg Abd 2 Views  Result Date: 10/15/2017 CLINICAL DATA:  Abdominal distension, small-bowel obstruction EXAM: ABDOMEN - 2 VIEW COMPARISON:  Abdominal and pelvic CT scan and abdominal series of October 14, 2016. FINDINGS: There is a moderate amount of stool and gas within the colon. No free extraluminal gas collections are observed. There degenerative changes of the lower lumbar spine. No abnormal soft tissue calcifications are observed. IMPRESSION: Moderately increased colonic stool burden. No evidence of perforation. Electronically Signed   By: David  Martinique M.D.   On: 10/15/2017 11:01      Kalman Drape , [facility] Surgery 10/17/2017, 8:23 AM Pager: [phone] Consults: [phone] Mon-Fri 7:00 am-4:30 pm Sat-Sun 7:00 am-11:30 am

## 2017-10-17 NOTE — Progress Notes (Signed)
Daily Rounding Note  10/17/2017, 10:06 AM  LOS: 3 days   SUBJECTIVE:   Chief complaint: fever to 102.8, tachy to 122.  CXR confirms bil PNA.  He is coughing up bloody, purulent sputum.  Diffuse abd pain continues, no BMs since yest AM.        OBJECTIVE:         Vital signs in last 24 hours:    Temp:  [97.4 F (36.3 C)-102.8 F (39.3 C)] 97.4 F (36.3 C) (01/10 0618) Pulse Rate:  [63-122] 63 (01/10 0618) Resp:  [18-20] 19 (01/10 0618) BP: (116-145)/(59-79) 130/59 (01/10 0618) SpO2:  [88 %-100 %] 88 % (01/10 0821) Last BM Date: 10/16/17 Filed Weights   10/14/17 0510 10/14/17 1902  Weight: 78 kg (172 lb) 76.9 kg (169 lb 8.5 oz)   General: looks ill and tired/weak.  Alert and conversant   Heart: Tachy in low 100s.   Chest: slight tachypnea and increased WOB.  Sputum bloody, thick Abdomen: soft, distended, tender with guarding diffusely.  BS tympanitic but not tinkling.   Extremities: no CCE Neuro/Psych:  Oriented x 3.  Moves all 4 limbs.    Intake/Output from previous day: 01/09 0701 - 01/10 0700 In: 2787.6 [P.O.:820; I.V.:1767.6; IV Piggyback:200] Out: 1875 [Urine:1875]  Intake/Output this shift: Total I/O In: 3 [I.V.:3] Out: -   Lab Results: Recent Labs    10/15/17 0250 10/16/17 0237  WBC 8.5 6.6  HGB 12.8* 11.4*  HCT 38.2* 33.4*  PLT 105* 79*   BMET Recent Labs    10/15/17 0250 10/16/17 0237 10/17/17 0333  NA 135 135 135  K 3.4* 3.1* 3.5  CL 103 109 107  CO2 22 19* 21*  GLUCOSE 81 90 135*  BUN '13 13 9  '$ CREATININE 1.26* 1.13 1.11  CALCIUM 7.4* 7.2* 7.6*   LFT Recent Labs    10/15/17 0250 10/16/17 0237 10/17/17 0333  PROT 6.0* 5.8* 5.7*  ALBUMIN 2.8* 2.4* 2.2*  AST 182* 82* 50*  ALT 197* 112* 78*  ALKPHOS 143* 105 113  BILITOT 5.3* 5.0* 6.8*   PT/INR Recent Labs    10/17/17 0333  LABPROT 14.1  INR 1.09   Hepatitis Panel Recent Labs    10/14/17 1404  HEPBSAG Negative    HCVAB <0.1  HEPAIGM Negative  HEPBIGM Negative    Studies/Results: Dg Chest 2 View  Result Date: 10/17/2017 CLINICAL DATA:  Fever EXAM: CHEST  2 VIEW COMPARISON:  Chest radiograph 06/25/2017 FINDINGS: There are bilateral upper lobe predominant opacities, worst in the right upper lobe. No pleural effusion or pneumothorax. Cardiomediastinal contours are unchanged. IMPRESSION: Bilateral upper lobe predominant opacities, worst in the right upper lobe. Findings are concerning for multifocal pneumonia. Electronically Signed   By: Ulyses Jarred M.D.   On: 10/17/2017 05:10   Dg Abd 1 View  Result Date: 10/16/2017 CLINICAL DATA:  Midline abdominal pain for 2-3 days. EXAM: ABDOMEN - 1 VIEW COMPARISON:  10/15/2017 FINDINGS: Moderate fecal retention within the right colon. No bowel obstruction or free air. No organomegaly or suspicious calculi. No acute osseous abnormality. IMPRESSION: Moderate fecal retention within the right colon. Electronically Signed   By: Ashley Royalty M.D.   On: 10/16/2017 20:52   Dg Abd 2 Views  Result Date: 10/15/2017 CLINICAL DATA:  Abdominal distension, small-bowel obstruction EXAM: ABDOMEN - 2 VIEW COMPARISON:  Abdominal and pelvic CT scan and abdominal series of October 14, 2016. FINDINGS: There is a moderate amount of  stool and gas within the colon. No free extraluminal gas collections are observed. There degenerative changes of the lower lumbar spine. No abnormal soft tissue calcifications are observed. IMPRESSION: Moderately increased colonic stool burden. No evidence of perforation. Electronically Signed   By: David  Martinique M.D.   On: 10/15/2017 11:01    Scheduled Meds: . chlorhexidine  15 mL Mouth Rinse BID  . gabapentin  300 mg Oral BID  . mouth rinse  15 mL Mouth Rinse q12n4p  . pantoprazole  40 mg Oral Daily  . sodium chloride flush  3 mL Intravenous Q12H  . tiotropium  18 mcg Inhalation Daily   Continuous Infusions: . sodium chloride    . dexrose 5 % and 0.45 %  NaCl with KCl 30 mEq/L 125 mL/hr at 10/17/17 0109  . piperacillin-tazobactam (ZOSYN)  IV Stopped (10/17/17 0913)  . vancomycin     PRN Meds:.sodium chloride, acetaminophen, albuterol, chlorproMAZINE, ondansetron (ZOFRAN) IV, sodium chloride flush   ASSESMENT:   *  Abd pain, n/v.  Constipation on xrays, still persisting on last nights films despite enemas  Ultrasound 1/7: GB contracted.   CT 1/7: no acute process.  Non-obstructing left nephrolithiasis, chronic compression fxs, aortic atherosclerosis.   EGD 1/8: retained gastric contents.    *  Acute hepatitis.  T bili rising, Alk phos and transaminases steadily improving.   Acute hepatitis panel negative.  Drinks a fair amount of malt liquor at home.   Elevated ESR 94.  Elevated CRP 22.  Elevated LDH 275 >> 211.    *  New onset thrombocytopenia. 173 >> 105 >> 79 >> 91.   Per Dr Rebeca Alert: "reviewed his blood smear and overall case with hematology the smear is most strongly suggestive of an infectious process with resulting thrombocytopenia"  His liver and spleen are normal on CT and ultrasound.  PT/INR also normal.     *  Fever.  Multifocal bil upper lobe PNA, R >> L.  New finding on today's CXR so HCAP.  Day 4 Zosyn, day 1 Vanc. High suspicion it is due to aspiration.     PLAN   *  Note stool path panel ordered by Dr Rebeca Alert, though doubt pt has infectious GI illness.    *  Hepatic fx profile in AM to ascertain the direct vs indirect bilirubin. Also CBC and BMET in AM.      *  See active orders for repeat CT abd, pelvis plus chest.    *  Processing labs include HIV, GGT, CMV IgM, blood clxs,   Azucena Freed  10/17/2017, 10:06 AM Pager: [phone]  GI ATTENDING  Interval history data reviewed. Patient seen and examined. Agree with interval progress note. Follow-up x-ray shows stool in the right colon but no obstruction. Now with pneumonia (possibly aspiration from recurrent vomiting). Liver tests continue to improve saved bilirubin.  Question Gilbert's syndrome. We will repeat liver panel tomorrow with fractionation of bilirubin. Platelets improved. Abdominal complaints and pain about the same. Again, no obvious etiology. Agree with repeat imaging studies to assess for interval change. We'll follow.  Docia Chuck. Geri Seminole., M.D. [facility] Division of Gastroenterology

## 2017-10-17 NOTE — Progress Notes (Signed)
Patient spiked fever of 100.8 with chills, Tylenol given about 20 minutes prior for generalized pain. MD paged and said not to give additional Tylenol but to give fluid bolus. Will continue to monitor.

## 2017-10-17 NOTE — Progress Notes (Signed)
Received call from tele that patient has had 3-beat runs of wide QRS. Temperature has resolved 99.3 oral. Currently is receiving LR bolus. MD paged about abnormal telemetry readings. Will continue to monitor.

## 2017-10-17 NOTE — Progress Notes (Signed)
  Date: 10/17/2017  Patient name: [name]  Medical record number: [id number]  Date of birth: 1945-12-02   I have seen and evaluated this patient and I have discussed the plan of care with the house staff. Please see their note for complete details. I concur with their findings with the following additions/corrections:   Overnight, [name] spiked a fever to 102. The night team sent a new set of blood cultures and checked a chest x-ray, which showed a right sided pulmonary infiltrate. As result, they added on vancomycin for broad coverage of possible pneumonia.  This morning, he reports feeling slightly better, but still with significant abdominal pain. He appeared slightly more comfortable, but breathing a little bit faster than he has. Lungs still sound clear without any consolidation evident on exam. Abdomen is softer and less distended, but still extremely tender to palpation with rebound tenderness.  Given his complex picture and the atypical appearance of the infiltrate on CXR, we checked a CT chest today. This showed bilateral extensive groundglass opacities, especially in the central and upper portion of both lungs. He has continued to spike fevers today, and became hypoxic this afternoon requiring O2. Given the broad differential of his CT findings and his worsening clinical picture, we have consulted pulmonology for assistance in evaluating his atypical interstitial pneumonia appearance. Infection remains most likely cause, although the organism is unclear at this point, as atypical, viral, bacterial, and even fungal may have this appearance. He has no recent travel history and no animal exposures other than a dog and a cat, but he does have a remote history of incarceration. We also need to consider inflammatory etiologies, and given his continued production of pink frothy sputum, diffuse alveolar hemorrhage syndromes are a consideration.  It is difficult to put together these  new findings with his presentation of severe abdominal pain and vomiting. It is possible the primary process is pneumonia with an associated ileus. His LFT abnormalities seem most consistent with alcoholic hepatitis, and I clarified with him today that he drinks 5-7 drinks on Saturdays and Sundays, but not during the week. He reports a remote history of heavy alcohol use, typically more than 10 beers a day, but he reports that was many years ago.  In the context of his clinical worsening today, we have given additional dose of vancomycin 750 mg to get him to a loading dose of 25 mg/kg today and will continue 15 mg/kg, as well as the vancomycin that he was previous on. We have also added azithromycin for atypical coverage and will try again to obtain a sputum culture and test for Legionella.  Oda Kilts, MD 10/17/2017, 5:52 PM

## 2017-10-17 NOTE — Progress Notes (Signed)
Received call from tele that patient's HR is irregular sustaining in the 140s and had a brief period of HR in 190s. MD notified and received order to complete STAT 12 lead EKG.

## 2017-10-17 NOTE — Progress Notes (Signed)
Pharmacy Antibiotic Note  [name] is a 72 y.o. male with possible PNA.  Pharmacy has been consulted for Vancomycin dosing.  Plan: Vancomycin 1250 mg IV now, then 750 mg IV q12h  Height: 5\' 7"  (170.2 cm) Weight: 169 lb 8.5 oz (76.9 kg) IBW/kg (Calculated) : 66.1  Temp (24hrs), Avg:100 F (37.8 C), Min:97.9 F (36.6 C), Max:102.8 F (39.3 C)  Recent Labs  Lab 10/14/17 0512 10/15/17 0250 10/16/17 0237 10/17/17 0333  WBC 11.7* 8.5 6.6  --   CREATININE 1.08 1.26* 1.13 1.11    Estimated Creatinine Clearance: 57.1 mL/min (by C-G formula based on SCr of 1.11 mg/dL).    No Known Allergies  Caryl Pina 10/17/2017 5:38 AM

## 2017-10-17 NOTE — Consult Note (Addendum)
[facility] Pulmonary Diseases & Critical Care Medicine Initial Consultation  Patient Name: [name] MRN: [id number] DOB: 04/28/1946    ADMISSION DATE:  10/14/2017 CONSULTATION DATE:  10/17/2017  REFERRING MD:  Dr. Ovid Curd Boswell/Dr. Lenice Pressman  REASON FOR CONSULTATION:  Abnormal chest CT/bilateral ground glass opacities   HISTORY OF PRESENT ILLNESS  This 72 y.o. Caucasian male smoker is seen in consultation at the request of Dr. Lenice Pressman for recommendations on further evaluation and management of an abnormal chest CT. He initially presented on 10/14/2017 for EGD to further evaluate complaints of abdominal pain, vomiting and pinkish emesis that started the night before presentation, with a recent history in increased NSAID use (Goody powder, meloxicam, aspirin) consumption and elevated LFTs. He had multiple bouts of emesis. The patient reports that he is certain that he also had a gross aspiration event.  REVIEW OF SYSTEMS Constitutional: No weight loss. No night sweats. No fever. No chills. No fatigue. HEENT: No headaches, dysphagia, sore throat, otalgia, nasal congestion, PND CV:  No chest pain, orthopnea, PND, swelling in lower extremities, palpitations GI:  Abdominal pain (diffuse). Nausea with multiple bouts of emesis. No diarrhea, change in bowel pattern, anorexia. Resp: No DOE at baseline but now experiencing mild exertional dyspnea. At baseline, claims to be able to walk 5 miles. No cough. No sputum production. No hemoptysis. Uses "inhalers" for COPD. GU: no dysuria, change in color of urine, no urgency or frequency.  No flank pain. MS:  No joint pain or swelling. No myalgias,  No decreased range of motion.  Psych:  No change in mood or affect. No memory loss. Skin: Jaundice   PAST MEDICAL/SURGICAL/SOCIAL/FAMILY HISTORIES   Past Medical History:  Diagnosis Date  . Anxiety and depression   . COPD (chronic obstructive pulmonary disease) ([place])   .  GERD (gastroesophageal reflux disease)   . HTN (hypertension)   . Hyperlipemia   . Migraine   . Personal history of alcoholism ([place])   . Tobacco use   . Tubular adenoma 01/29/2008    Past Surgical History:  Procedure Laterality Date  . ANTERIOR FUSION CERVICAL SPINE  2005   two surgeries after two mechanical falls, per Dr. Donald Pore  . COLONOSCOPY W/ BIOPSIES  01/29/2008   Dr. Silvano Rusk  . ESOPHAGOGASTRODUODENOSCOPY N/A 10/15/2017   Procedure: ESOPHAGOGASTRODUODENOSCOPY (EGD);  Surgeon: Irene Shipper, MD;  Location: [facility] ENDOSCOPY;  Service: Endoscopy;  Laterality: N/A;  . [place]   correction for crossed-eyes  . Pass [place]    Social History   Socioeconomic History  . Marital status: Divorced    Spouse name: Not on file  . Number of children: Not on file  . Years of education: Not on file  . Highest education level: Not on file  Social Needs  . Financial resource strain: Not on file  . Food insecurity - worry: Not on file  . Food insecurity - inability: Not on file  . Transportation needs - medical: Not on file  . Transportation needs - non-medical: Not on file  Occupational History  . Occupation: On disability   . Occupation: Estate manager/land agent    Comment: he worked at [employer] in [place]  . Occupation: Child psychotherapist: [employer]    Comment: years ago  . Occupation: Development worker, community  . 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 Smoking status: Current Every Day Smoker  Packs/day: 0.50    Years: 55.00    Pack years: 27.50    Types: Cigarettes  . Smokeless tobacco: Never Used  Substance and Sexual Activity  . Alcohol use: Yes    Alcohol/week: 0.0 oz    Comment: Beer-rarely.  . Drug use: No  . Sexual activity: Not Currently  Other Topics Concern  . Not on file  Social History Narrative   His son died in a car accident in 62. His granddaughter died in a house fire in January 24, 2010.    He  lives by himself in a boarding house. He prepares his own meals, he is able to go to the pharmacy and grocery store. He uses public transportation. He uses a cane at his baseline and is independent of ADLs. Most of his family live in [place], [place], or [place]--brother, nieces and nephews. He has a sister in [place], [place] but she is disabled. He attends [employer] in [place] and his Doristine Bosworth there, [name], calls him almost every other day and checks on him 3-4 times per month.     Family History  Problem Relation Age of Onset  . Heart attack Mother   . Heart attack Father   . Stroke Sister   . Heart attack Brother   . Heart attack Sister     No Known Allergies   Prior to Admission medications   Medication Sig Start Date End Date Taking? Authorizing Provider  acetaminophen (TYLENOL) 325 MG tablet Take 2 tablets (650 mg total) by mouth every 6 (six) hours as needed for mild pain or moderate pain. 06/25/17  Yes Waynetta Pean, PA-C  alendronate (FOSAMAX) 70 MG tablet Take 1 tablet (70 mg total) by mouth every 7 (seven) days. Take with a full glass of water on an empty stomach. 06/27/17 06/27/18 Yes Alphonzo Grieve, MD  aspirin EC 81 MG tablet Take 1 tablet (81 mg total) by mouth daily. 04/12/17 04/12/18 Yes Lorella Nimrod, MD  atorvastatin (LIPITOR) 20 MG tablet Take 1 tablet (20 mg total) by mouth daily. 10/03/17  Yes Lorella Nimrod, MD  Diclofenac Sodium 3 % GEL Place 1 application onto the skin 2 (two) times daily. To affected area. 06/25/17  Yes Waynetta Pean, PA-C  furosemide (LASIX) 20 MG tablet Take 1 tablet (20 mg total) by mouth daily. For leg swelling. 08/02/17 08/02/18 Yes Lorella Nimrod, MD  gabapentin (NEURONTIN) 300 MG capsule Take 1 capsule (300 mg total) by mouth 2 (two) times daily. 07/12/17  Yes Lorella Nimrod, MD  HYDROcodone-acetaminophen (NORCO) 5-325 MG tablet Take 1 tablet by mouth every 6 (six) hours as needed for moderate pain. 09/04/17  Yes Rice, Resa Miner, MD   meloxicam (MOBIC) 7.5 MG tablet Take 7.5 mg by mouth daily.   Yes [provider]  Multiple Vitamins-Minerals (CENTRUM SILVER ADULT 50+) TABS Take 1 tablet by mouth daily.   Yes [provider]  PROAIR HFA 108 (90 Base) MCG/ACT inhaler INHALE 2 PUFFS INTO THE LUNGS FOUR TIMES DAILY Patient taking differently: INHALE 2 PUFFS INTO THE LUNGS FOUR TIMES DAILY AS NEEDED FOR SOB. 07/22/17  Yes Lorella Nimrod, MD  tiotropium (SPIRIVA) 18 MCG inhalation capsule Place 1 capsule (18 mcg total) into inhaler and inhale daily. 02/20/17  Yes Lorella Nimrod, MD  tiZANidine (ZANAFLEX) 2 MG tablet Take 2 mg by mouth every 6 (six) hours as needed for muscle spasms.   Yes [provider]    Current Facility-Administered Medications  Medication Dose Route Frequency Provider Last Rate Last  Dose  . 0.9 %  sodium chloride infusion  250 mL Intravenous PRN Maryellen Pile, MD      . acetaminophen (TYLENOL) tablet 650 mg  650 mg Oral Q6H PRN Ina Homes, MD   650 mg at 10/17/17 1301  . albuterol (PROVENTIL) (2.5 MG/3ML) 0.083% nebulizer solution 2.5 mg  2.5 mg Nebulization Q2H PRN Maryellen Pile, MD      . albuterol (PROVENTIL) (2.5 MG/3ML) 0.083% nebulizer solution 2.5 mg  2.5 mg Nebulization QID Oda Kilts, MD   2.5 mg at 10/17/17 1604  . [START ON 10/18/2017] azithromycin (ZITHROMAX) tablet 250 mg  250 mg Oral Daily Maryellen Pile, MD      . chlorhexidine (PERIDEX) 0.12 % solution 15 mL  15 mL Mouth Rinse BID Oda Kilts, MD   15 mL at 10/17/17 4696  . chlorproMAZINE (THORAZINE) tablet 25 mg  25 mg Oral TID PRN Ina Homes, MD   25 mg at 10/16/17 1831  . dextrose 5 % and 0.45 % NaCl with KCl 30 mEq/L infusion   Intravenous Continuous Vena Rua, PA-C 125 mL/hr at 10/17/17 1253    . gabapentin (NEURONTIN) capsule 300 mg  300 mg Oral BID Maryellen Pile, MD   300 mg at 10/17/17 0852  . iopamidol (ISOVUE-300) 61 % injection           . MEDLINE mouth rinse  15 mL  Mouth Rinse q12n4p Oda Kilts, MD   15 mL at 10/17/17 1444  . ondansetron (ZOFRAN) injection 4 mg  4 mg Intravenous Q8H PRN Colbert Ewing, MD   4 mg at 10/16/17 0557  . pantoprazole (PROTONIX) EC tablet 40 mg  40 mg Oral Daily Vena Rua, PA-C   40 mg at 10/17/17 2952  . piperacillin-tazobactam (ZOSYN) IVPB 3.375 g  3.375 g Intravenous Q8H Erenest Blank, RPH 12.5 mL/hr at 10/17/17 1252 3.375 g at 10/17/17 1252  . sodium chloride flush (NS) 0.9 % injection 3 mL  3 mL Intravenous Q12H Maryellen Pile, MD   3 mL at 10/17/17 0853  . sodium chloride flush (NS) 0.9 % injection 3 mL  3 mL Intravenous PRN Maryellen Pile, MD      . tiotropium [facility]) inhalation capsule 18 mcg  18 mcg Inhalation Daily Maryellen Pile, MD   18 mcg at 10/17/17 8413  . vancomycin (VANCOCIN) IVPB 750 mg/150 ml premix  750 mg Intravenous Once Maryellen Pile, MD 150 mL/hr at 10/17/17 1622 750 mg at 10/17/17 1622  . [START ON 10/18/2017] vancomycin (VANCOCIN) IVPB 750 mg/150 ml premix  750 mg Intravenous Q12H Kris Mouton, [facility]         VITAL SIGNS: BP 135/77 (BP Location: Left Arm)   Pulse 73   Temp 99.3 F (37.4 C) (Oral)   Resp (!) 24   Ht 5\' 7"  (1.702 m)   Wt 76.9 kg (169 lb 8.5 oz)   SpO2 90%   BMI 26.55 kg/m   INTAKE / OUTPUT: I/O last 3 completed shifts: In: 4592.6 [P.O.:1080; I.V.:3312.6; IV Piggyback:200] Out: 2440 [Urine:3425]  PHYSICAL EXAMINATION:  Vitals:   10/17/17 1333 10/17/17 1429 10/17/17 1444 10/17/17 1604  BP: 135/77     Pulse: 66   73  Resp:  (!) 31  (!) 24  Temp: (!) 102 F (38.9 C) 99.3 F (37.4 C)    TempSrc: Axillary Oral    SpO2: 90% (!) 87% 92% 90%  Weight:      Height:  General:  Alert, awake and oriented to time, person and place. Cooperative. No acute distress. On 2 LPM. Head: normocephalic, atraumatic EYE: PERRLA, EOM intact. Scleral icterus. No pallor Nose: nares are patent. No polyps. No exudate. No sinus tenderness. Throat/Oral Cavity: No  oral thrush. No exudate. Mucous membranes are moist. No tonsillar enlargement. Neck: supple, no thyromegaly, no JVD, no lymphadenopathy. Trachea midline. Chest/Lung: symmetric in development and expansion. Good air entry. Diminished breath sounds in the bases. Bibasilar coarse crackles. No wheezes. Heart: Regular S1 and S2 without murmur, rub or gallop. Abdomen: soft, nontender, nondistended. Normoactive bowel sounds. No rebound. No guarding. Extremities: no pedal edema, no cyanosis, no clubbing. 2+ DP pulses Lymphatic: no cervical/axiallary/inguinal lymph nodes appreciated Skin:  Jaundice. NEURO: cranial nerves II-XII are grossly symmetric and physiologic. Babinski absent. No sensory deficit. No motor deficit. DTR 2+ @ RUE, 2+ @ LUE 2+ @ RLL,  2+ @ LLL. No cerebellar signs. Gait was not assessed.   LABS:  BMET Recent Labs  Lab 10/15/17 0250 10/16/17 0237 10/17/17 0333  NA 135 135 135  K 3.4* 3.1* 3.5  CL 103 109 107  CO2 22 19* 21*  BUN 13 13 9   CREATININE 1.26* 1.13 1.11  GLUCOSE 81 90 135*    Electrolytes Recent Labs  Lab 10/15/17 0250 10/16/17 0237 10/17/17 0333  CALCIUM 7.4* 7.2* 7.6*    CBC Recent Labs  Lab 10/15/17 0250 10/16/17 0237 10/17/17 0333  WBC 8.5 6.6 5.6  HGB 12.8* 11.4* 11.8*  HCT 38.2* 33.4* 33.6*  PLT 105* 79* 91*    Coag's Recent Labs  Lab 10/17/17 0333  INR 1.09    Sepsis Markers Recent Labs  Lab 10/17/17 1507  LATICACIDVEN 1.4    ABG Recent Labs  Lab 10/17/17 1613  PHART 7.482*  PCO2ART 27.4*  PO2ART 75.5*    Liver Enzymes Recent Labs  Lab 10/15/17 0250 10/16/17 0237 10/17/17 0333  AST 182* 82* 50*  ALT 197* 112* 78*  ALKPHOS 143* 105 113  BILITOT 5.3* 5.0* 6.8*  ALBUMIN 2.8* 2.4* 2.2*    Imaging Dg Chest 2 View  Result Date: 10/17/2017 CLINICAL DATA:  Fever EXAM: CHEST  2 VIEW COMPARISON:  Chest radiograph 06/25/2017 FINDINGS: There are bilateral upper lobe predominant opacities, worst in the right upper  lobe. No pleural effusion or pneumothorax. Cardiomediastinal contours are unchanged. IMPRESSION: Bilateral upper lobe predominant opacities, worst in the right upper lobe. Findings are concerning for multifocal pneumonia. Electronically Signed   By: Ulyses Jarred M.D.   On: 10/17/2017 05:10   Dg Abd 1 View  Result Date: 10/16/2017 CLINICAL DATA:  Midline abdominal pain for 2-3 days. EXAM: ABDOMEN - 1 VIEW COMPARISON:  10/15/2017 FINDINGS: Moderate fecal retention within the right colon. No bowel obstruction or free air. No organomegaly or suspicious calculi. No acute osseous abnormality. IMPRESSION: Moderate fecal retention within the right colon. Electronically Signed   By: Ashley Royalty M.D.   On: 10/16/2017 20:52   Ct Chest W Contrast  Result Date: 10/17/2017 CLINICAL DATA:  Abdominal distension, pneumonia.  Sepsis. EXAM: CT CHEST WITH CONTRAST TECHNIQUE: Multidetector CT imaging of the chest was performed following the standard protocol during bolus administration of intravenous contrast. CONTRAST:  173mL ISOVUE-300 IOPAMIDOL (ISOVUE-300) INJECTION 61% COMPARISON:  Chest CT dated 08/10/2016. Chest x-ray dated 10/17/2017. FINDINGS: CT CHEST FINDINGS Cardiovascular: Heart size is normal. No pericardial effusion. No thoracic aortic aneurysm or dissection. Mediastinum/Nodes: Stable mildly prominent lymph node within the lower left mediastinum, measuring 11 mm short  axis dimension. New mild lymphadenopathy within the right hilum, largest lymph node measuring 10 mm short axis dimension Esophagus is unremarkable. Trachea and central bronchi are unremarkable. Lungs/Pleura: Large ground-glass consolidations within the perihilar and upper lobe regions bilaterally, new compared to the chest CT of 08/10/2016, corresponding to the opacities seen on today's chest x-ray, pulmonary edema versus atypical pneumonia. Small bilateral pleural effusions with adjacent atelectasis. Musculoskeletal: Mild degenerative spurring  within the thoracic spine. No acute or suspicious osseous finding. IMPRESSION: 1. Large ground-glass consolidations within the perihilar and upper lobe regions bilaterally, new compared to chest CT of 08/10/2016, corresponding to the bilateral lung opacities seen on today's chest x-ray, most likely atypical pneumonia and/or pulmonary edema. Differential includes atypical pneumonias such as viral or fungal, interstitial pneumonias, edema related to volume overload/CHF, chronic interstitial diseases, hypersensitivity pneumonitis, and respiratory bronchiolitis. 2. New mild lymphadenopathy within the right hilum, likely reactive. 3. Small bilateral pleural effusions. Electronically Signed   By: Franki Cabot M.D.   On: 10/17/2017 14:38   Ct Abdomen Pelvis W Contrast  Result Date: 10/17/2017 CLINICAL DATA:  72 y/o M; abdominal pain, nausea, vomiting. Transaminitis. EXAM: CT ABDOMEN AND PELVIS WITH CONTRAST TECHNIQUE: Multidetector CT imaging of the abdomen and pelvis was performed using the standard protocol following bolus administration of intravenous contrast. CONTRAST:  120mL ISOVUE-300 IOPAMIDOL (ISOVUE-300) INJECTION 61% COMPARISON:  10/14/2016 CT abdomen and pelvis FINDINGS: Lower chest: Please refer to concurrent CT of chest. Hepatobiliary: Enlarged left lobe of liver and mild heterogeneity of liver attenuation may represent cirrhosis. No focal liver lesion identified. Punctate calcification in right lobe of liver. Normal gallbladder. Mild intrahepatic biliary ductal dilatation. Pancreas: Unremarkable. No pancreatic ductal dilatation or surrounding inflammatory changes. Spleen: Normal in size without focal abnormality. Adrenals/Urinary Tract: Normal adrenal glands. Punctate left kidney nephrolithiasis in the upper pole. Multiple small kidney cysts. No hydronephrosis. Normal bladder. Stomach/Bowel: Stomach is within normal limits. Appendix not identified. No evidence of bowel wall thickening, distention, or  inflammatory changes. Vascular/Lymphatic: Aortic atherosclerosis. No enlarged abdominal or pelvic lymph nodes. Reproductive: Prostate is unremarkable. Other: Mild retroperitoneal edema. Musculoskeletal: Stable T12 moderate to severe anterior compression deformity and inferior endplate fractures at the L2 and L4 levels. IMPRESSION: 1. Enlarged left lobe of liver and mild heterogeneity may represent cirrhosis. 2. Left kidney nonobstructive nephrolithiasis. 3. Aortic atherosclerosis. 4. Stable T12, L2, and L4 compression deformities. Electronically Signed   By: Kristine Garbe M.D.   On: 10/17/2017 14:29    CULTURES: Results for orders placed or performed during the hospital encounter of 10/14/17  Blood culture (routine x 2)     Status: None (Preliminary result)   Collection Time: 10/15/17  6:30 AM  Result Value Ref Range Status   Specimen Description BLOOD LEFT HAND  Final   Special Requests   Final    BOTTLES DRAWN AEROBIC AND ANAEROBIC Blood Culture results may not be optimal due to an inadequate volume of blood received in culture bottles   Culture NO GROWTH 2 DAYS  Final   Report Status PENDING  Incomplete  Blood culture (routine x 2)     Status: None (Preliminary result)   Collection Time: 10/15/17  6:45 AM  Result Value Ref Range Status   Specimen Description BLOOD RIGHT HAND  Final   Special Requests   Final    BOTTLES DRAWN AEROBIC AND ANAEROBIC Blood Culture adequate volume   Culture NO GROWTH 2 DAYS  Final   Report Status PENDING  Incomplete  Culture, expectorated sputum-assessment  Status: None   Collection Time: 10/17/17 10:50 AM  Result Value Ref Range Status   Specimen Description SPUTUM  Final   Special Requests NONE  Final   Sputum evaluation   Final    Sputum specimen not acceptable for testing.  Please recollect.   Gram Stain Report Called to,Read Back By and Verified With: A FOWLER,RN AT 3710 10/17/17 BY L BENFIELD    Report Status 10/17/2017 FINAL  Final     ANTIBIOTICS:  Zosyn started 10/15/2017  Vancomycin started 10/17/2017  SIGNIFICANT EVENTS: 10/15/2017 EGD   ASSESSMENT / PLAN: [ASSESSMENT] Principal Problem:   Abdominal pain Active Problems:   Abnormal chest CT   Aspiration pneumonia (HCC)   Acute respiratory distress   Acute respiratory alkalosis   Hepatitis   Cholestasis   Mediastinal lymphadenopathy   Pleural effusion, bilateral   COPD (chronic obstructive pulmonary disease) (HCC)   Gastroparesis   Thrombocytopenia (HCC)  [DISCUSSION] The differential diagnosis of bilateral groundglass opacities is broad -- too broad. This discussion will focus on the most likely candidates.  His clinical history compels consideration of aspiration pneumonitis as the most likely cause of the patient's current bilateral groundglass opacities (I.e. In the absence of previous imaging that reveals similar findings).   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 These clinical history elements raise concern for infection with nontuberculous mycobacteria, MRSA, Aspergillus species, Pseudomonas, etc.   He has bilateral groundglass opacities, right lower lobe traction bronchiectasis, and findings suspicious for early honeycombining in the bases (although, not with the classic subpleural lines). This constellation does warrant including usual interstitial pneumonitis/idiopathic pulmonary fibrosis in the differential diagnosis of an abnormal chest CT showing groundglass opacities.  Also, idiopathic interstitial pneumonias (diffuse parenchymal lung disease) may also be considered.  Inevitably, malignancy must also be included in the differential diagnosis in a long-time smoker, although he does not report worrisome  symptoms.  [PLAN/RECOMMENDATIONS]: To help parse this complicated differential diagnosis, the patient should undergo a period of empiric therapy followed by repeat imaging. The follow up imaging will guide subsequent diagnostic efforts, if necessary.  Sputum for respiratory cultures, AFB cultures, cytology should be obtained. Check angiotensin converting enzyme level, serum total IgE.  Antibiotics for empiric coverage of aspiration pneumonia (e.g. Augmentin. I would suggest 10-14 days of therapy)  Needs to be on PPI therapy for at least 8 weeks. I will defer to gastroenterology on the timing of switching from BID to daily dosing. Please be sure to educate the patient on the importance of taking a PPI one hour PRIOR to eating a meal.  Chest CT without contrast should be repeated in 3 months.  I do not think that the patient warrants a "shotgun" approach to diagnosis of possible interstitial lung disease at this time. On the other hand, it is imperative that the patient follow up with repeat imaging to determine if further diagnostic evaluation is warranted.  Outpatient pulmonary follow up with [facility] is advised.   Renee Pain, MD Board Certified by the ABIM, [place] Pager: [phone]  10/17/2017, 5:02 PM

## 2017-10-17 NOTE — Progress Notes (Signed)
Chest x ray completed, results available for review in Epic. MD on call notified.

## 2017-10-17 NOTE — Care Management Note (Signed)
Case Management Note  Patient Details  Name: [name] MRN: [id number] Date of Birth: 21-Mar-1946  Subjective/Objective:       Presents c/o abd pain, hx of  HTN, tobacco use, and chronic lumbar back pain.   s/p EGD 1/8      PCP: Lorella Nimrod  Action/Plan: Transition to home when medically stable. CM follow for disposition needs.  Expected Discharge Date:  10/16/17               Expected Discharge Plan:  Home/Self Care  In-House Referral:     Discharge planning Services  CM Consult  Post Acute Care Choice:    Choice offered to:     DME Arranged:    DME Agency:     HH Arranged:    HH Agency:     Status of Service:  In process, will continue to follow  If discussed at Long Length of Stay Meetings, dates discussed:    Additional Comments:  Sharin Mons, RN 10/17/2017, 3:19 PM

## 2017-10-17 NOTE — Progress Notes (Addendum)
Pt having runs of A-fib with RVR, rare up to 150's for a few seconds then back down; this has been happening the last several minutes per telemetry. MD on call notified, no orders at this time.   Orders received for pt, EKG performed, blood drawn and 5mg  metoprolol administered. Pt now ST in low 100's with frequent PAC's per telemetry. Pt denies any sx throughout rhythm change.

## 2017-10-18 DIAGNOSIS — R748 Abnormal levels of other serum enzymes: Secondary | ICD-10-CM

## 2017-10-18 DIAGNOSIS — R918 Other nonspecific abnormal finding of lung field: Secondary | ICD-10-CM

## 2017-10-18 DIAGNOSIS — R509 Fever, unspecified: Secondary | ICD-10-CM | POA: Diagnosis present

## 2017-10-18 DIAGNOSIS — J189 Pneumonia, unspecified organism: Secondary | ICD-10-CM

## 2017-10-18 DIAGNOSIS — I10 Essential (primary) hypertension: Secondary | ICD-10-CM

## 2017-10-18 DIAGNOSIS — K219 Gastro-esophageal reflux disease without esophagitis: Secondary | ICD-10-CM

## 2017-10-18 DIAGNOSIS — J449 Chronic obstructive pulmonary disease, unspecified: Secondary | ICD-10-CM

## 2017-10-18 DIAGNOSIS — R140 Abdominal distension (gaseous): Secondary | ICD-10-CM

## 2017-10-18 DIAGNOSIS — F17210 Nicotine dependence, cigarettes, uncomplicated: Secondary | ICD-10-CM

## 2017-10-18 DIAGNOSIS — D696 Thrombocytopenia, unspecified: Secondary | ICD-10-CM

## 2017-10-18 DIAGNOSIS — E785 Hyperlipidemia, unspecified: Secondary | ICD-10-CM

## 2017-10-18 LAB — RESPIRATORY PANEL BY PCR

## 2017-10-18 LAB — HEPATIC FUNCTION PANEL
ALBUMIN: 2 g/dL — AB (ref 3.5–5.0)
ALT: 58 U/L (ref 17–63)
AST: 50 U/L — ABNORMAL HIGH (ref 15–41)
Alkaline Phosphatase: 106 U/L (ref 38–126)
BILIRUBIN INDIRECT: 2.9 mg/dL — AB (ref 0.3–0.9)
Bilirubin, Direct: 5.4 mg/dL — ABNORMAL HIGH (ref 0.1–0.5)
TOTAL PROTEIN: 5.6 g/dL — AB (ref 6.5–8.1)
Total Bilirubin: 8.3 mg/dL — ABNORMAL HIGH (ref 0.3–1.2)

## 2017-10-18 LAB — BASIC METABOLIC PANEL
Anion gap: 6 (ref 5–15)
BUN: 6 mg/dL (ref 6–20)
CALCIUM: 7.8 mg/dL — AB (ref 8.9–10.3)
CO2: 20 mmol/L — ABNORMAL LOW (ref 22–32)
CREATININE: 0.99 mg/dL (ref 0.61–1.24)
Chloride: 109 mmol/L (ref 101–111)
GFR calc non Af Amer: >60 mL/min (ref >60)
Glucose, Bld: 129 mg/dL — ABNORMAL HIGH (ref 65–99)
Potassium: 3.9 mmol/L (ref 3.5–5.1)
SODIUM: 135 mmol/L (ref 135–145)

## 2017-10-18 LAB — GASTROINTESTINAL PANEL BY PCR, STOOL (REPLACES STOOL CULTURE)

## 2017-10-18 LAB — CBC
HCT: 35.4 % — ABNORMAL LOW (ref 39.0–52.0)
Hemoglobin: 12.4 g/dL — ABNORMAL LOW (ref 13.0–17.0)
MCH: 31.9 pg (ref 26.0–34.0)
MCHC: 35 g/dL (ref 30.0–36.0)
MCV: 91 fL (ref 78.0–100.0)
Platelets: 107 10*3/uL — ABNORMAL LOW (ref 150–400)
RBC: 3.89 MIL/uL — ABNORMAL LOW (ref 4.22–5.81)
RDW: 12.9 % (ref 11.5–15.5)
WBC: 8.1 10*3/uL (ref 4.0–10.5)

## 2017-10-18 LAB — EXPECTORATED SPUTUM ASSESSMENT W GRAM STAIN, RFLX TO RESP C

## 2017-10-18 LAB — LEGIONELLA PNEUMOPHILA SEROGP 1 UR AG: L. pneumophila Serogp 1 Ur Ag: NEGATIVE

## 2017-10-18 LAB — CMV IGM: CMV IgM: <30 AU/mL (ref 0.0–29.9)

## 2017-10-18 LAB — HIV ANTIBODY (ROUTINE TESTING W REFLEX): HIV Screen 4th Generation wRfx: NONREACTIVE

## 2017-10-18 MED ORDER — SODIUM CHLORIDE 0.9 % IV SOLN
3.0000 g | Freq: Four times a day (QID) | INTRAVENOUS | Status: DC
  Administered 2017-10-18 – 2017-10-19 (×4): 3 g via INTRAVENOUS
  Filled 2017-10-18 (×6): qty 3

## 2017-10-18 MED ORDER — ALBUTEROL SULFATE (2.5 MG/3ML) 0.083% IN NEBU
3.0000 mL | INHALATION_SOLUTION | RESPIRATORY_TRACT | Status: DC | PRN
  Administered 2017-10-22: 3 mL via RESPIRATORY_TRACT
  Filled 2017-10-18: qty 3

## 2017-10-18 MED ORDER — GLUCERNA SHAKE PO LIQD
237.0000 mL | Freq: Two times a day (BID) | ORAL | Status: DC
  Administered 2017-10-18: 237 mL via ORAL

## 2017-10-18 MED ORDER — ACETAMINOPHEN 325 MG PO TABS
650.0000 mg | ORAL_TABLET | Freq: Four times a day (QID) | ORAL | Status: DC | PRN
  Administered 2017-10-18 – 2017-10-20 (×2): 650 mg via ORAL
  Filled 2017-10-18 (×2): qty 2

## 2017-10-18 NOTE — Progress Notes (Signed)
Daily Rounding Note  10/18/2017, 8:22 AM  LOS: 4 days   SUBJECTIVE:   Chief complaint: malaise.  Feels tired, ill, weak.  Abdominal pain persists.  No more n/v.  Stool x 2 yesterday, 1 this AM: loose.    Not eating much, anorexic and diet is clear liquids Afib with brief runs RVR overnight.   OBJECTIVE:         Vital signs in last 24 hours:    Temp:  [99.3 F (37.4 C)-102 F (38.9 C)] 99.8 F (37.7 C) (01/11 0512) Pulse Rate:  [66-109] 106 (01/11 0512) Resp:  [18-31] 18 (01/11 0512) BP: (133-135)/(75-79) 135/79 (01/11 0512) SpO2:  [87 %-96 %] 95 % (01/11 0813) FiO2 (%):  [28 %] 28 % (01/11 0813) Last BM Date: 10/17/17 Filed Weights   10/14/17 0510 10/14/17 1902  Weight: 78 kg (172 lb) 76.9 kg (169 lb 8.5 oz)   General: looks unwell, weak, jaundiced   Heart: sinus tachy in 1teens.   Chest: dyspneic, tachypneic, cough with rusty sputum Abdomen: soft, slight upper   Extremities: feet warm, good pedal pulses.  No edema Neuro/Psych:  Alert, appropriate, oriented x 3.  Moves all 4 limbs, no tremor.    Intake/Output from previous day: 01/10 0701 - 01/11 0700 In: 4028 [I.V.:3378; IV Piggyback:650] Out: 2700 [Urine:2700]  Intake/Output this shift: No intake/output data recorded.  Lab Results: Recent Labs    10/16/17 0237 10/17/17 0333 10/18/17 0207  WBC 6.6 5.6 8.1  HGB 11.4* 11.8* 12.4*  HCT 33.4* 33.6* 35.4*  PLT 79* 91* 107*   BMET Recent Labs    10/16/17 0237 10/17/17 0333 10/18/17 0207  NA 135 135 135  K 3.1* 3.5 3.9  CL 109 107 109  CO2 19* 21* 20*  GLUCOSE 90 135* 129*  BUN '13 9 6  ' CREATININE 1.13 1.11 0.99  CALCIUM 7.2* 7.6* 7.8*   LFT Recent Labs    10/16/17 0237 10/17/17 0333 10/18/17 0207  PROT 5.8* 5.7* 5.6*  ALBUMIN 2.4* 2.2* 2.0*  AST 82* 50* 50*  ALT 112* 78* 58  ALKPHOS 105 113 106  BILITOT 5.0* 6.8* 8.3*  BILIDIR  --   --  5.4*  IBILI  --   --  2.9*   PT/INR Recent  Labs    10/17/17 0333  LABPROT 14.1  INR 1.09   Hepatitis Panel No results for input(s): HEPBSAG, HCVAB, HEPAIGM, HEPBIGM in the last 72 hours.  Studies/Results: Dg Chest 2 View  Result Date: 10/17/2017 CLINICAL DATA:  Fever EXAM: CHEST  2 VIEW COMPARISON:  Chest radiograph 06/25/2017 FINDINGS: There are bilateral upper lobe predominant opacities, worst in the right upper lobe. No pleural effusion or pneumothorax. Cardiomediastinal contours are unchanged. IMPRESSION: Bilateral upper lobe predominant opacities, worst in the right upper lobe. Findings are concerning for multifocal pneumonia. Electronically Signed   By: Ulyses Jarred M.D.   On: 10/17/2017 05:10   Dg Abd 1 View  Result Date: 10/16/2017 CLINICAL DATA:  Midline abdominal pain for 2-3 days. EXAM: ABDOMEN - 1 VIEW COMPARISON:  10/15/2017 FINDINGS: Moderate fecal retention within the right colon. No bowel obstruction or free air. No organomegaly or suspicious calculi. No acute osseous abnormality. IMPRESSION: Moderate fecal retention within the right colon. Electronically Signed   By: Ashley Royalty M.D.   On: 10/16/2017 20:52   Ct Chest W Contrast  Result Date: 10/17/2017 CLINICAL DATA:  Abdominal distension, pneumonia.  Sepsis. EXAM: CT CHEST WITH CONTRAST  TECHNIQUE: Multidetector CT imaging of the chest was performed following the standard protocol during bolus administration of intravenous contrast. CONTRAST:  170m ISOVUE-300 IOPAMIDOL (ISOVUE-300) INJECTION 61% COMPARISON:  Chest CT dated 08/10/2016. Chest x-ray dated 10/17/2017. FINDINGS: CT CHEST FINDINGS Cardiovascular: Heart size is normal. No pericardial effusion. No thoracic aortic aneurysm or dissection. Mediastinum/Nodes: Stable mildly prominent lymph node within the lower left mediastinum, measuring 11 mm short axis dimension. New mild lymphadenopathy within the right hilum, largest lymph node measuring 10 mm short axis dimension Esophagus is unremarkable. Trachea and central  bronchi are unremarkable. Lungs/Pleura: Large ground-glass consolidations within the perihilar and upper lobe regions bilaterally, new compared to the chest CT of 08/10/2016, corresponding to the opacities seen on today's chest x-ray, pulmonary edema versus atypical pneumonia. Small bilateral pleural effusions with adjacent atelectasis. Musculoskeletal: Mild degenerative spurring within the thoracic spine. No acute or suspicious osseous finding. IMPRESSION: 1. Large ground-glass consolidations within the perihilar and upper lobe regions bilaterally, new compared to chest CT of 08/10/2016, corresponding to the bilateral lung opacities seen on today's chest x-ray, most likely atypical pneumonia and/or pulmonary edema. Differential includes atypical pneumonias such as viral or fungal, interstitial pneumonias, edema related to volume overload/CHF, chronic interstitial diseases, hypersensitivity pneumonitis, and respiratory bronchiolitis. 2. New mild lymphadenopathy within the right hilum, likely reactive. 3. Small bilateral pleural effusions. Electronically Signed   By: SFranki CabotM.D.   On: 10/17/2017 14:38   Ct Abdomen Pelvis W Contrast  Result Date: 10/17/2017 CLINICAL DATA:  72y/o M; abdominal pain, nausea, vomiting. Transaminitis. EXAM: CT ABDOMEN AND PELVIS WITH CONTRAST TECHNIQUE: Multidetector CT imaging of the abdomen and pelvis was performed using the standard protocol following bolus administration of intravenous contrast. CONTRAST:  1073mISOVUE-300 IOPAMIDOL (ISOVUE-300) INJECTION 61% COMPARISON:  10/14/2016 CT abdomen and pelvis FINDINGS: Lower chest: Please refer to concurrent CT of chest. Hepatobiliary: Enlarged left lobe of liver and mild heterogeneity of liver attenuation may represent cirrhosis. No focal liver lesion identified. Punctate calcification in right lobe of liver. Normal gallbladder. Mild intrahepatic biliary ductal dilatation. Pancreas: Unremarkable. No pancreatic ductal dilatation  or surrounding inflammatory changes. Spleen: Normal in size without focal abnormality. Adrenals/Urinary Tract: Normal adrenal glands. Punctate left kidney nephrolithiasis in the upper pole. Multiple small kidney cysts. No hydronephrosis. Normal bladder. Stomach/Bowel: Stomach is within normal limits. Appendix not identified. No evidence of bowel wall thickening, distention, or inflammatory changes. Vascular/Lymphatic: Aortic atherosclerosis. No enlarged abdominal or pelvic lymph nodes. Reproductive: Prostate is unremarkable. Other: Mild retroperitoneal edema. Musculoskeletal: Stable T12 moderate to severe anterior compression deformity and inferior endplate fractures at the L2 and L4 levels. IMPRESSION: 1. Enlarged left lobe of liver and mild heterogeneity may represent cirrhosis. 2. Left kidney nonobstructive nephrolithiasis. 3. Aortic atherosclerosis. 4. Stable T12, L2, and L4 compression deformities. Electronically Signed   By: LaKristine Garbe.D.   On: 10/17/2017 14:29   Scheduled Meds: . albuterol  2.5 mg Nebulization QID  . azithromycin  250 mg Oral Daily  . chlorhexidine  15 mL Mouth Rinse BID  . gabapentin  300 mg Oral BID  . mouth rinse  15 mL Mouth Rinse q12n4p  . pantoprazole  40 mg Oral Daily  . sodium chloride flush  3 mL Intravenous Q12H  . tiotropium  18 mcg Inhalation Daily   Continuous Infusions: . sodium chloride    . dexrose 5 % and 0.45 % NaCl with KCl 30 mEq/L 125 mL/hr at 10/18/17 0548  . piperacillin-tazobactam (ZOSYN)  IV Stopped (10/18/17 0944)  .  vancomycin Stopped (10/18/17 0250)   PRN Meds:.sodium chloride, acetaminophen, albuterol, chlorproMAZINE, ondansetron (ZOFRAN) IV, sodium chloride flush   ASSESMENT:   *  Abd pain, n/v.  Constipation on xrays. Ultrasound 1/7: GB contracted.   CT 1/7: no acute process.  Non-obstructing left nephrolithiasis, chronic compression fxs, aortic atherosclerosis.   EGD 1/8: retained gastric contents.   CT 1/10: enlarged  left hepatic lobe and possible cirrhosis.  Bil PNA vs edema vs interstitial dz.  Small bil pleural effusions.  Right hilar adenopathy, reactive.  No evidence of ileus or intestinal obstruction.   *  Acute hepatitis.  T bili rising direct >> indirect c/w Gilbert's.  Alk phos and transaminases steadily improving.  Acute hepatitis panel negative.  GGT elevated 276.   Elevated ESR 94.  Elevated CRP 22.  Elevated LDH 275 >> 211.  CMV wnl.  coags normal. Drinks a fair amount of malt liquor at home on weekends.  *  Bil upper lobe HCA PNA, R >> L.  Vanco, Zosyn, Azithromycin in place.  Resp panel negative.   *  New onset A fib, RVR overnight.  Sinus tachycardia this AM  *  Thrombocytopenia.  Improving.   Per Dr Rebeca Alert: "reviewed his blood smear and overall case with hematology the smear is most strongly suggestive of an infectious process with resulting thrombocytopenia".  CT # 2 raising ? Of cirrhosis.  *  Hyperglycemia.    *  Protein calorie malnutrition.      PLAN   *  IgE, ACE, Legionella pending.    *  Allow full liquids and added bid glucerna.    Azucena Freed  10/18/2017, 8:22 AM Pager: [phone]  GI ATTENDING  Interval history data reviewed. Patient seen and examined. Agree with interval progress note. I was wondering if this patient could have acute porphyria. Screen with urine porphobilinogen. Otherwise, continue multiple supportive measures for different problems as outlined. We'll follow.  Docia Chuck. Geri Seminole., M.D. [facility] Division of Gastroenterology

## 2017-10-18 NOTE — Progress Notes (Signed)
  Date: 10/18/2017  Patient name: [name]  Medical record number: [id number]  Date of birth: [birth year]   I have seen and evaluated this patient and I have discussed the plan of care with the house staff. Please see their note for complete details. I concur with their findings with the following additions/corrections:   Slightly improved this morning on rounds. Still requiring oxygen, but breathing a little bit easier. Abdominal pain seems to have improved significantly. On exam, his tenderness is significantly decreased in his abdomen, while still distended, is much softer.  Still difficult to put together all of his symptoms and findings under one unifying diagnosis, and I appreciate the assistance of our multiple consultants. Fortunately, his LFTs continue to improve, with the exception of his bilirubin. Fractionation of the bilirubin indicates primarily conjugated bilirubin contributing to his elevation, consistent with the cholestatic picture we have seen during this admission. I'm still not sure if the initial illness was a pneumonia, followed by a ileus and concurrent alcoholic hepatitis, or if he had ascending cholangitis due to a gallstone which was passed by the time he was evaluated by imaging here, and the interstitial lung findings are reactive.  At this time, we will narrow antibiotics as recommended by ID to Unasyn and continue to watch his clinical trend. He will need follow-up with pulmonary to reevaluate his interstitial lung disease, but this can likely wait until after discharge, unless he clinically deteriorates during this hospitalization.  Oda Kilts, MD 10/18/2017, 6:13 PM

## 2017-10-18 NOTE — Progress Notes (Signed)
CRITICAL VALUE ALERT  Critical Value:  GIP + E coli  Date & Time Notied:  10/18/17 @ 3887  Provider Notified: On call after 5pm Internal Medicine paged and aware  Orders Received/Actions taken: Will await orders

## 2017-10-18 NOTE — Progress Notes (Signed)
[facility]  Consult date 10/17/2017  Brief: 72 y/o male non smoker was admitted for abdominal pain, abnormal LFTs.  He had some nausea and vomiting and an aspiration event prior to admission.  He was found to have an abnormal CT chest.  Brief: breathing is OK  . Vitals:   10/18/17 0813 10/18/17 1151 10/18/17 1345 10/18/17 1446  BP:    111/60  Pulse:    (!) 114  Resp:    (!) 22  Temp:    98.5 F (36.9 C)  TempSrc:      SpO2: 95% 95% 91% 91%  Weight:      Height:       Gen: well appearing, sitting up in chair HENT: OP clear, TM's clear, neck supple PULM: normal effort, symmetric chest rise CV: warm, well perfused GI: , soft, nontender Derm: no cyanosis or rash Psyche: normal mood and affect  CT chest images reviewed: there is a central ground glass in a broncho vascular distribution sparing the periphery on a background of mild non-specific reticular changes in the periphery of the lower lobes  Impression/plan: ILD: no clear to me, I doubt UIP based on this CT, consider HP given upper lobe predominance; I think the central ground glass is due to aspiration Aspiration pnumonitis: complete 5 day course antibiotics  F/u with  pulmonary, I gave him my card  PCCM will sign off  Roselie Awkward, MD [facility] Pager: [phone] Cell: [phone] After 3pm or if no response, call [phone]

## 2017-10-18 NOTE — Progress Notes (Signed)
3 Days Post-Op   Subjective/Chief Complaint: Several loose BM yesterday Afib with RVR last night Abd softer   Objective: Vital signs in last 24 hours: Temp:  [99.3 F (37.4 C)-102 F (38.9 C)] 99.8 F (37.7 C) (01/11 0512) Pulse Rate:  [66-109] 106 (01/11 0512) Resp:  [18-31] 18 (01/11 0512) BP: (133-135)/(75-79) 135/79 (01/11 0512) SpO2:  [87 %-96 %] 95 % (01/11 0813) FiO2 (%):  [28 %] 28 % (01/11 0813) Last BM Date: 10/17/17  Intake/Output from previous day: 01/10 0701 - 01/11 0700 In: 4028 [I.V.:3378; IV Piggyback:650] Out: 2700 [Urine:2700] Intake/Output this shift: Total I/O In: 0  Out: 500 [Urine:500]  Resting comfortably Abdomen seems less distended, non-tender  Lab Results:  Recent Labs    10/17/17 0333 10/18/17 0207  WBC 5.6 8.1  HGB 11.8* 12.4*  HCT 33.6* 35.4*  PLT 91* 107*   BMET Recent Labs    10/17/17 0333 10/18/17 0207  NA 135 135  K 3.5 3.9  CL 107 109  CO2 21* 20*  GLUCOSE 135* 129*  BUN 9 6  CREATININE 1.11 0.99  CALCIUM 7.6* 7.8*   Hepatic Function Latest Ref Rng & Units 10/18/2017 10/17/2017 10/16/2017  Total Protein 6.5 - 8.1 g/dL 5.6(L) 5.7(L) 5.8(L)  Albumin 3.5 - 5.0 g/dL 2.0(L) 2.2(L) 2.4(L)  AST 15 - 41 U/L 50(H) 50(H) 82(H)  ALT 17 - 63 U/L 58 78(H) 112(H)  Alk Phosphatase 38 - 126 U/L 106 113 105  Total Bilirubin 0.3 - 1.2 mg/dL 8.3(H) 6.8(H) 5.0(H)  Bilirubin, Direct 0.1 - 0.5 mg/dL 5.4(H) - -    PT/INR Recent Labs    10/17/17 0333  LABPROT 14.1  INR 1.09   ABG Recent Labs    10/17/17 1613  PHART 7.482*  HCO3 20.3    Studies/Results: Dg Chest 2 View  Result Date: 10/17/2017 CLINICAL DATA:  Fever EXAM: CHEST  2 VIEW COMPARISON:  Chest radiograph 06/25/2017 FINDINGS: There are bilateral upper lobe predominant opacities, worst in the right upper lobe. No pleural effusion or pneumothorax. Cardiomediastinal contours are unchanged. IMPRESSION: Bilateral upper lobe predominant opacities, worst in the right upper  lobe. Findings are concerning for multifocal pneumonia. Electronically Signed   By: Ulyses Jarred M.D.   On: 10/17/2017 05:10   Dg Abd 1 View  Result Date: 10/16/2017 CLINICAL DATA:  Midline abdominal pain for 2-3 days. EXAM: ABDOMEN - 1 VIEW COMPARISON:  10/15/2017 FINDINGS: Moderate fecal retention within the right colon. No bowel obstruction or free air. No organomegaly or suspicious calculi. No acute osseous abnormality. IMPRESSION: Moderate fecal retention within the right colon. Electronically Signed   By: Ashley Royalty M.D.   On: 10/16/2017 20:52   Ct Chest W Contrast  Result Date: 10/17/2017 CLINICAL DATA:  Abdominal distension, pneumonia.  Sepsis. EXAM: CT CHEST WITH CONTRAST TECHNIQUE: Multidetector CT imaging of the chest was performed following the standard protocol during bolus administration of intravenous contrast. CONTRAST:  138m ISOVUE-300 IOPAMIDOL (ISOVUE-300) INJECTION 61% COMPARISON:  Chest CT dated 08/10/2016. Chest x-ray dated 10/17/2017. FINDINGS: CT CHEST FINDINGS Cardiovascular: Heart size is normal. No pericardial effusion. No thoracic aortic aneurysm or dissection. Mediastinum/Nodes: Stable mildly prominent lymph node within the lower left mediastinum, measuring 11 mm short axis dimension. New mild lymphadenopathy within the right hilum, largest lymph node measuring 10 mm short axis dimension Esophagus is unremarkable. Trachea and central bronchi are unremarkable. Lungs/Pleura: Large ground-glass consolidations within the perihilar and upper lobe regions bilaterally, new compared to the chest CT of 08/10/2016, corresponding to  the opacities seen on today's chest x-ray, pulmonary edema versus atypical pneumonia. Small bilateral pleural effusions with adjacent atelectasis. Musculoskeletal: Mild degenerative spurring within the thoracic spine. No acute or suspicious osseous finding. IMPRESSION: 1. Large ground-glass consolidations within the perihilar and upper lobe regions  bilaterally, new compared to chest CT of 08/10/2016, corresponding to the bilateral lung opacities seen on today's chest x-ray, most likely atypical pneumonia and/or pulmonary edema. Differential includes atypical pneumonias such as viral or fungal, interstitial pneumonias, edema related to volume overload/CHF, chronic interstitial diseases, hypersensitivity pneumonitis, and respiratory bronchiolitis. 2. New mild lymphadenopathy within the right hilum, likely reactive. 3. Small bilateral pleural effusions. Electronically Signed   By: Franki Cabot M.D.   On: 10/17/2017 14:38   Ct Abdomen Pelvis W Contrast  Result Date: 10/17/2017 CLINICAL DATA:  72 y/o M; abdominal pain, nausea, vomiting. Transaminitis. EXAM: CT ABDOMEN AND PELVIS WITH CONTRAST TECHNIQUE: Multidetector CT imaging of the abdomen and pelvis was performed using the standard protocol following bolus administration of intravenous contrast. CONTRAST:  183m ISOVUE-300 IOPAMIDOL (ISOVUE-300) INJECTION 61% COMPARISON:  10/14/2016 CT abdomen and pelvis FINDINGS: Lower chest: Please refer to concurrent CT of chest. Hepatobiliary: Enlarged left lobe of liver and mild heterogeneity of liver attenuation may represent cirrhosis. No focal liver lesion identified. Punctate calcification in right lobe of liver. Normal gallbladder. Mild intrahepatic biliary ductal dilatation. Pancreas: Unremarkable. No pancreatic ductal dilatation or surrounding inflammatory changes. Spleen: Normal in size without focal abnormality. Adrenals/Urinary Tract: Normal adrenal glands. Punctate left kidney nephrolithiasis in the upper pole. Multiple small kidney cysts. No hydronephrosis. Normal bladder. Stomach/Bowel: Stomach is within normal limits. Appendix not identified. No evidence of bowel wall thickening, distention, or inflammatory changes. Vascular/Lymphatic: Aortic atherosclerosis. No enlarged abdominal or pelvic lymph nodes. Reproductive: Prostate is unremarkable. Other: Mild  retroperitoneal edema. Musculoskeletal: Stable T12 moderate to severe anterior compression deformity and inferior endplate fractures at the L2 and L4 levels. IMPRESSION: 1. Enlarged left lobe of liver and mild heterogeneity may represent cirrhosis. 2. Left kidney nonobstructive nephrolithiasis. 3. Aortic atherosclerosis. 4. Stable T12, L2, and L4 compression deformities. Electronically Signed   By: LKristine GarbeM.D.   On: 10/17/2017 14:29    Anti-infectives: Anti-infectives (From admission, onward)   Start     Dose/Rate Route Frequency Ordered Stop   10/18/17 1000  azithromycin (ZITHROMAX) tablet 250 mg     250 mg Oral Daily 10/17/17 1519 10/22/17 0959   10/18/17 0100  vancomycin (VANCOCIN) IVPB 750 mg/150 ml premix     750 mg 150 mL/hr over 60 Minutes Intravenous Every 12 hours 10/17/17 1554     10/17/17 1800  vancomycin (VANCOCIN) IVPB 750 mg/150 ml premix  Status:  Discontinued     750 mg 150 mL/hr over 60 Minutes Intravenous Every 12 hours 10/17/17 0541 10/17/17 1554   10/17/17 1630  vancomycin (VANCOCIN) IVPB 750 mg/150 ml premix     750 mg 150 mL/hr over 60 Minutes Intravenous  Once 10/17/17 1503 10/17/17 1722   10/17/17 1600  azithromycin (ZITHROMAX) tablet 500 mg     500 mg Oral  Once 10/17/17 1519 10/17/17 1625   10/17/17 0600  vancomycin (VANCOCIN) 1,250 mg in sodium chloride 0.9 % 250 mL IVPB     1,250 mg 166.7 mL/hr over 90 Minutes Intravenous  Once 10/17/17 0541 10/17/17 0807   10/15/17 0615  piperacillin-tazobactam (ZOSYN) IVPB 3.375 g     3.375 g 12.5 mL/hr over 240 Minutes Intravenous Every 8 hours 10/15/17 0610     10/14/17  1145  piperacillin-tazobactam (ZOSYN) IVPB 3.375 g     3.375 g 100 mL/hr over 30 Minutes Intravenous  Once 10/14/17 1145 10/14/17 1229      Assessment/Plan: Abdominal pain, nausea, vomiting - improved Constipation - resolving Transaminitis, Hyperbilirubinemia -CT scan1/10 no surgical issues - RUQ ultrasound1/7shows a contracted  gallbladder without signs of cholecystitis.  - on arrival WBC 11.7, lipase 23, AST 469, ALT 241, alk phos 205, total bilirubin 3.9 - Hepatitis panel negative - Total bilirubin 8.3 - Abdominal xray1/8shows moderate colonic stool burden without evidence of perforation.  - EGD1/8showed a normal esophagus, no evidence of ulcer, large amount of food residue in the gastric fundus/gastroparesis.  ID - zosyn 1/7>>   Vancomycin 01/10>> FEN - IVF, NPO, clears VTE - SCDs, lovenox  Plan -Advance diet as tolerated No surgical issues Will sign off.    LOS: 4 days    Maia Petties 10/18/2017

## 2017-10-18 NOTE — Consult Note (Signed)
[facility]    Date of Admission:  10/14/2017            Day 5 piperacillin tazobactam        Day 3 vancomycin        Day 2 azithromycin       Reason for Consult: Fever, elevated liver enzymes, thrombocytopenia and pulmonary infiltrates    Referring Provider: Dr. Ina Homes  Assessment: The exact cause of his acute febrile illness remains uncertain.  I wonder if he may have passed a gallstone given the sudden onset and severe nature of his pain which is out of proportion to what is generally seen with viral hepatitis.  His high fevers, worsening cough and pulmonary infiltrates seem to come on later in this illness.  While it is possible that he may have developed aspiration pneumonitis from vomiting the groundglass pattern and upper lobe distribution would be unusual for this.  I suspect that he may have developed some type of acute lung injury that may not be infectious.  Nonetheless, given his recent high fevers and uncertainty as to the exact cause of the illness I would continue empiric antibiotic therapy but would recommend narrowing to ampicillin sulbactam alone.  I will follow with you this weekend.  Plan: 1. Narrow antibiotic therapy to ampicillin sulbactam pending further observation and final culture results  Principal Problem:   Hepatitis Active Problems:   Cholestasis   Thrombocytopenia (HCC)   Pulmonary infiltrates   Fever   COPD (chronic obstructive pulmonary disease) (HCC)   Abdominal pain   Gastroparesis   Abnormal chest CT   Acute respiratory distress   Acute respiratory alkalosis   Mediastinal lymphadenopathy   Pleural effusion, bilateral   Scheduled Meds: . albuterol  2.5 mg Nebulization QID  . azithromycin  250 mg Oral Daily  . chlorhexidine  15 mL Mouth Rinse BID  . feeding supplement (GLUCERNA SHAKE)  237 mL Oral BID BM  . gabapentin  300 mg Oral BID  . mouth rinse  15 mL Mouth Rinse q12n4p  . pantoprazole  40 mg  Oral Daily  . sodium chloride flush  3 mL Intravenous Q12H  . tiotropium  18 mcg Inhalation Daily   Continuous Infusions: . sodium chloride    . dexrose 5 % and 0.45 % NaCl with KCl 30 mEq/L 125 mL/hr at 10/18/17 0548  . piperacillin-tazobactam (ZOSYN)  IV Stopped (10/18/17 1759)  . vancomycin Stopped (10/18/17 1459)   PRN Meds:.sodium chloride, acetaminophen, albuterol, chlorproMAZINE, ondansetron (ZOFRAN) IV, sodium chloride flush  HPI: [name] is a 72 y.o. male with a history of COPD, hypertension, dyslipidemia and GERD who developed severe abdominal pain, nausea and vomiting shortly after eating his dinner meal on 10/13/2016 leading to admission.  He had low-grade fever, abdominal distention and was found to have elevated liver enzymes and bilirubin.  He was started on empiric therapy with piperacillin tazobactam.  CT scan of the abdomen and ultrasound did not reveal any gallstones or other acute abnormalities.  Serologies for hepatitis A, B, C, CMV and HIV were negative.  Blood cultures were negative.  He developed thrombocytopenia.  His fever started to increase on the third day of admission.  Blood cultures were repeated and are negative.  Chest x-ray and chest CT revealed groundglass opacities bilaterally with upper lobe predominance.  He has increased cough productive of thin, pink sputum.  Vancomycin and azithromycin were added to his regimen.  His alkaline phosphatase and hepatic transaminases are decreasing but his bilirubin has increased slightly.  His thrombocytopenia is resolving.  He still has abdominal pain but this is improving.  He is retired and lives alone.  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  He has no pets or animals or unusual exposures recently.  He has had no recent travel.  He has a remote history of exposure to a cousin who had active tuberculosis but says that he has been  tested on numerous occasions and was always negative.   Review of Systems: Review of Systems  Constitutional: Positive for chills, fever and malaise/fatigue. Negative for diaphoresis and weight loss.  HENT: Negative for congestion and sore throat.   Respiratory: Positive for cough, sputum production and shortness of breath. Negative for wheezing.   Cardiovascular: Negative for chest pain.  Gastrointestinal: Positive for abdominal pain, constipation, nausea and vomiting. Negative for diarrhea.  Genitourinary: Negative for dysuria.  Musculoskeletal: Negative for back pain, joint pain and myalgias.  Skin: Negative for rash.  Neurological: Positive for headaches.    Past Medical History:  Diagnosis Date  . Anxiety and depression   . COPD (chronic obstructive pulmonary disease) ([place])   . GERD (gastroesophageal reflux disease)   . HTN (hypertension)   . Hyperlipemia   . Migraine   . Personal history of alcoholism ([place])   . Tobacco use   . Tubular adenoma 01/29/2008    Social History   Tobacco Use  . Smoking status: Current Every Day Smoker    Packs/day: 0.50    Years: 55.00    Pack years: 27.50    Types: Cigarettes  . Smokeless tobacco: Never Used  Substance Use Topics  . Alcohol use: Yes    Alcohol/week: 0.0 oz    Comment: Beer-rarely.  . Drug use: No    Family History  Problem Relation Age of Onset  . Heart attack Mother   . Heart attack Father   . Stroke Sister   . Heart attack Brother   . Heart attack Sister    No Known Allergies  OBJECTIVE: Blood pressure 111/60, pulse (!) 114, temperature 98.5 F (36.9 C), resp. rate (!) 22, height 5\' 7"  (1.702 m), weight 169 lb 8.5 oz (76.9 kg), SpO2 96 %.  Physical Exam  Constitutional: He is oriented to person, place, and time.  He was sleeping but arouses easily.  He appears quite tired.  HENT:  Mouth/Throat: No oropharyngeal exudate.  He is edentulous.  Eyes: Conjunctivae are normal.  Neck: Neck supple.    Cardiovascular: Normal rate and regular rhythm.  No murmur heard. Pulmonary/Chest: Effort normal. He has no wheezes. He has rales.  Abdominal: Soft. He exhibits distension. He exhibits no mass. There is tenderness.  Musculoskeletal: Normal range of motion. He exhibits no edema or tenderness.  Neurological: He is alert and oriented to person, place, and time.  Skin: No rash noted.  Psychiatric: Mood and affect normal.    Lab Results Lab Results  Component Value Date   WBC 8.1 10/18/2017   HGB 12.4 (L) 10/18/2017   HCT 35.4 (L) 10/18/2017   MCV 91.0 10/18/2017   PLT 107 (L) 10/18/2017    Lab Results  Component Value Date   CREATININE 0.99 10/18/2017   BUN 6 10/18/2017   NA 135 10/18/2017   K 3.9 10/18/2017   CL 109 10/18/2017   CO2 20 (L) 10/18/2017  Lab Results  Component Value Date   ALT 58 10/18/2017   AST 50 (H) 10/18/2017   ALKPHOS 106 10/18/2017   BILITOT 8.3 (H) 10/18/2017     Microbiology: Recent Results (from the past 240 hour(s))  Blood culture (routine x 2)     Status: None (Preliminary result)   Collection Time: 10/15/17  6:30 AM  Result Value Ref Range Status   Specimen Description BLOOD LEFT HAND  Final   Special Requests   Final    BOTTLES DRAWN AEROBIC AND ANAEROBIC Blood Culture results may not be optimal due to an inadequate volume of blood received in culture bottles   Culture NO GROWTH 3 DAYS  Final   Report Status PENDING  Incomplete  Blood culture (routine x 2)     Status: None (Preliminary result)   Collection Time: 10/15/17  6:45 AM  Result Value Ref Range Status   Specimen Description BLOOD RIGHT HAND  Final   Special Requests   Final    BOTTLES DRAWN AEROBIC AND ANAEROBIC Blood Culture adequate volume   Culture NO GROWTH 3 DAYS  Final   Report Status PENDING  Incomplete  Culture, blood (routine x 2)     Status: None (Preliminary result)   Collection Time: 10/17/17  4:25 AM  Result Value Ref Range Status   Specimen Description BLOOD  LEFT ANTECUBITAL  Final   Special Requests   Final    BOTTLES DRAWN AEROBIC AND ANAEROBIC Blood Culture adequate volume   Culture NO GROWTH 1 DAY  Final   Report Status PENDING  Incomplete  Culture, blood (routine x 2)     Status: None (Preliminary result)   Collection Time: 10/17/17  4:25 AM  Result Value Ref Range Status   Specimen Description BLOOD LEFT HAND  Final   Special Requests   Final    BOTTLES DRAWN AEROBIC ONLY Blood Culture adequate volume   Culture NO GROWTH 1 DAY  Final   Report Status PENDING  Incomplete  Culture, expectorated sputum-assessment     Status: None   Collection Time: 10/17/17 10:50 AM  Result Value Ref Range Status   Specimen Description SPUTUM  Final   Special Requests NONE  Final   Sputum evaluation   Final    Sputum specimen not acceptable for testing.  Please recollect.   Gram Stain Report Called to,Read Back By and Verified With: A FOWLER,RN AT [id number] 10/17/17 BY L BENFIELD    Report Status 10/17/2017 FINAL  Final  Respiratory Panel by PCR     Status: None   Collection Time: 10/17/17  8:47 PM  Result Value Ref Range Status   Adenovirus NOT DETECTED NOT DETECTED Final   Coronavirus 229E NOT DETECTED NOT DETECTED Final   Coronavirus HKU1 NOT DETECTED NOT DETECTED Final   Coronavirus NL63 NOT DETECTED NOT DETECTED Final   Coronavirus OC43 NOT DETECTED NOT DETECTED Final   Metapneumovirus NOT DETECTED NOT DETECTED Final   Rhinovirus / Enterovirus NOT DETECTED NOT DETECTED Final   Influenza A NOT DETECTED NOT DETECTED Final   Influenza B NOT DETECTED NOT DETECTED Final   Parainfluenza Virus 1 NOT DETECTED NOT DETECTED Final   Parainfluenza Virus 2 NOT DETECTED NOT DETECTED Final   Parainfluenza Virus 3 NOT DETECTED NOT DETECTED Final   Parainfluenza Virus 4 NOT DETECTED NOT DETECTED Final   Respiratory Syncytial Virus NOT DETECTED NOT DETECTED Final   Bordetella pertussis NOT DETECTED NOT DETECTED Final   Chlamydophila pneumoniae NOT DETECTED NOT  DETECTED  Final   Mycoplasma pneumoniae NOT DETECTED NOT DETECTED Final  Culture, expectorated sputum-assessment     Status: None   Collection Time: 10/17/17  9:45 PM  Result Value Ref Range Status   Specimen Description EXPECTORATED SPUTUM  Final   Special Requests NONE  Final   Sputum evaluation THIS SPECIMEN IS ACCEPTABLE FOR SPUTUM CULTURE  Final   Report Status 10/18/2017 FINAL  Final  Culture, respiratory (NON-Expectorated)     Status: None (Preliminary result)   Collection Time: 10/17/17  9:45 PM  Result Value Ref Range Status   Specimen Description EXPECTORATED SPUTUM  Final   Special Requests NONE Reflexed from [id number]  Final   Gram Stain   Final    MODERATE WBC PRESENT, PREDOMINANTLY MONONUCLEAR FEW SQUAMOUS EPITHELIAL CELLS PRESENT FEW BUDDING YEAST SEEN RARE GRAM POSITIVE COCCI IN PAIRS    Culture PENDING  Incomplete   Report Status PENDING  Incomplete    Michel Bickers, MD [facility] [phone] pager   [phone] cell 10/18/2017, 5:20 PM

## 2017-10-18 NOTE — Progress Notes (Signed)
Pharmacy Antibiotic Note  [name] is a 72 y.o. male with possible aspiration PNA.  Pharmacy has been consulted for Unasyn dosing; narrowing from vancomycin and Zosyn. Renal function is stable. He is afebrile, WBC are normal. Sputum gram stain showing rare GPC pairs, few budding yeast.  Plan: Unasyn 3 g IV q6h Pharmacy signing off - please re-consult if needed   Height: 5\' 7"  (170.2 cm) Weight: 169 lb 8.5 oz (76.9 kg) IBW/kg (Calculated) : 66.1  Temp (24hrs), Avg:99.2 F (37.3 C), Min:98.5 F (36.9 C), Max:99.8 F (37.7 C)  Recent Labs  Lab 10/14/17 0512 10/15/17 0250 10/16/17 0237 10/17/17 0333 10/17/17 1507 10/18/17 0207  WBC 11.7* 8.5 6.6 5.6  --  8.1  CREATININE 1.08 1.26* 1.13 1.11  --  0.99  LATICACIDVEN  --   --   --   --  1.4  --     Estimated Creatinine Clearance: 64 mL/min (by C-G formula based on SCr of 0.99 mg/dL).    No Known Allergies  [name], PharmD, BCPS Clinical Pharmacist Phone for today - [facility] - [phone] 10/18/2017 5:47 PM

## 2017-10-18 NOTE — Progress Notes (Signed)
   Subjective: Patient states that he is having multiple bowel movements per day.  He is very unsatisfied with some of the care he has received today.  He states that he sat in fecal matter for almost an hour and a half before anybody came to help him.  He does feel that his abdominal pain and distention are improved.  He denies any further nausea and vomiting.  He is still having a productive cough.  He feels very weak, and would like to get up more.  We discussed continuing current antibiotic therapy until infectious disease has evaluated him.  We discussed having physical therapy come by to see him.  He agrees with the plan.  All questions and concerns addressed.  Objective: Vital signs in last 24 hours: Vitals:   10/17/17 1604 10/17/17 2109 10/17/17 2357 10/18/17 0512  BP:   133/75 135/79  Pulse: 73 (!) 104 (!) 109 (!) 106  Resp: (!) 24 (!) 25 18 18   Temp:   99.4 F (37.4 C) 99.8 F (37.7 C)  TempSrc:      SpO2: 90% 90% 96% 95%  Weight:      Height:       General: Obese male in no acute distress HENT: Blood-tinged mucus from the nares Pulm: Diminished breath sounds with faint inspiratory crackles in the lower lung fields CV: Tachycardic with no murmurs or rubs Abdomen: Active bowel sounds compared to yesterday, less distended and not tender to palpation today Extremities: No lower extremity edema  Assessment/Plan:  Sepsis 2/2 Multifocal PNA - Patient continues to have cough and be tachycardic. Last fever recorded yesterday afternoon. - Labs continue to improve - Labs pending including sputum culture, sputum AFB, IgE, ACE.  Viral respiratory panel including influenza negative. - Sputum smear showing few budding yeast and rare gram-positive cocci in pair. - Blood culture showing no growth to date. - Appreciate pulmonology's consult and thoughtful note. - We have consulted infectious disease to ensure her workup is complete, and evaluate whether his triad of symptoms fit with any  infectious etiology. We appreciate their help and recommendations. - Continue Vanc/Zosyn until patient is afebrile for greater than 24 hours at which point we will consider de-escalating to Augmentin therapy.  Abdominal Pain. -Patient tolerated more p.o. today. Feels his abdominal pain is improving. Continuing to have bowel movements.   - LFT continue to normalize but bilirubin remains high. Fractionated bilirubin obtained with elevation and direct bilirubin this is consistent with a post hepatic process as it should majority of the bilirubin is becoming conjugated through the liver. - Repeat CT abdomen showing no acute intra-abdominal process. - Maintaining K >4.0  - Continuing Zosyn, will follow blood cultures  - Appreciate GIs recommendations.  Normocytic Anemia and Thrombocytopenia  - Continue to improve. Likely due to acute illness.  Tachycardia. -Review of telemetry showing sinus tachycardia with multiple PVCs.  Chronic Lumbar Back Pain  - Continuing Gabapentin  - Holding Meloxicam, Norco, and Tizanidine   Migraine Headaches  - Patient uses excessive amounts of NSAIDs for both his migraines and chronic back pain - Will consider prophylaxis with a beta blocker   COPD - Continue Spiriva with albuterol PRN  Hyperlipidemia  - Hold Atorvastatin but will resume on discharge  -LDL 50  Dispo: Anticipated discharge in approximately >1 day(s).   Ina Homes, MD 10/18/2017, 6:37 AM My Pager: [phone]

## 2017-10-19 ENCOUNTER — Inpatient Hospital Stay (HOSPITAL_COMMUNITY): Payer: Medicare Other

## 2017-10-19 DIAGNOSIS — R140 Abdominal distension (gaseous): Secondary | ICD-10-CM

## 2017-10-19 DIAGNOSIS — K3184 Gastroparesis: Secondary | ICD-10-CM

## 2017-10-19 DIAGNOSIS — R10819 Abdominal tenderness, unspecified site: Secondary | ICD-10-CM

## 2017-10-19 DIAGNOSIS — R0682 Tachypnea, not elsewhere classified: Secondary | ICD-10-CM

## 2017-10-19 DIAGNOSIS — R1084 Generalized abdominal pain: Secondary | ICD-10-CM

## 2017-10-19 DIAGNOSIS — K5900 Constipation, unspecified: Secondary | ICD-10-CM

## 2017-10-19 DIAGNOSIS — E872 Acidosis: Secondary | ICD-10-CM

## 2017-10-19 LAB — CBC
HCT: 35.5 % — ABNORMAL LOW (ref 39.0–52.0)
Hemoglobin: 12.6 g/dL — ABNORMAL LOW (ref 13.0–17.0)
MCH: 32.1 pg (ref 26.0–34.0)
MCHC: 35.5 g/dL (ref 30.0–36.0)
MCV: 90.6 fL (ref 78.0–100.0)
Platelets: 130 10*3/uL — ABNORMAL LOW (ref 150–400)
RBC: 3.92 MIL/uL — ABNORMAL LOW (ref 4.22–5.81)
RDW: 13.1 % (ref 11.5–15.5)
WBC: 10.5 10*3/uL (ref 4.0–10.5)

## 2017-10-19 LAB — URINALYSIS, COMPLETE (UACMP) WITH MICROSCOPIC
Glucose, UA: NEGATIVE mg/dL
Ketones, ur: NEGATIVE mg/dL
Leukocytes, UA: NEGATIVE
Nitrite: NEGATIVE
Protein, ur: 30 mg/dL — AB
Specific Gravity, Urine: 1.02 (ref 1.005–1.030)
Squamous Epithelial / LPF: NONE SEEN
pH: 6 (ref 5.0–8.0)

## 2017-10-19 LAB — COMPREHENSIVE METABOLIC PANEL
ALT: 49 U/L (ref 17–63)
ANION GAP: 8 (ref 5–15)
AST: 53 U/L — ABNORMAL HIGH (ref 15–41)
Albumin: 2.1 g/dL — ABNORMAL LOW (ref 3.5–5.0)
Alkaline Phosphatase: 122 U/L (ref 38–126)
BILIRUBIN TOTAL: 11.2 mg/dL — AB (ref 0.3–1.2)
BUN: 6 mg/dL (ref 6–20)
CO2: 20 mmol/L — ABNORMAL LOW (ref 22–32)
Calcium: 8.2 mg/dL — ABNORMAL LOW (ref 8.9–10.3)
Chloride: 107 mmol/L (ref 101–111)
Creatinine, Ser: 0.93 mg/dL (ref 0.61–1.24)
Glucose, Bld: 119 mg/dL — ABNORMAL HIGH (ref 65–99)
Potassium: 4.7 mmol/L (ref 3.5–5.1)
Sodium: 135 mmol/L (ref 135–145)
TOTAL PROTEIN: 6.1 g/dL — AB (ref 6.5–8.1)

## 2017-10-19 LAB — BLOOD GAS, ARTERIAL
Acid-base deficit: 4.2 mmol/L — ABNORMAL HIGH (ref 0.0–2.0)
Bicarbonate: 18.9 mmol/L — ABNORMAL LOW (ref 20.0–28.0)
Drawn by: 246101
FIO2: 50
O2 Saturation: 96.8 %
Patient temperature: 100.9
pCO2 arterial: 28.6 mmHg — ABNORMAL LOW (ref 32.0–48.0)
pH, Arterial: 7.443 (ref 7.350–7.450)
pO2, Arterial: 88.9 mmHg (ref 83.0–108.0)

## 2017-10-19 LAB — ANGIOTENSIN CONVERTING ENZYME: Angiotensin-Converting Enzyme: 49 U/L (ref 14–82)

## 2017-10-19 LAB — BILIRUBIN, FRACTIONATED(TOT/DIR/INDIR)
Bilirubin, Direct: 7.9 mg/dL — ABNORMAL HIGH (ref 0.1–0.5)
Indirect Bilirubin: 4.3 mg/dL — ABNORMAL HIGH (ref 0.3–0.9)
Total Bilirubin: 12.2 mg/dL — ABNORMAL HIGH (ref 0.3–1.2)

## 2017-10-19 LAB — LIPASE, BLOOD: Lipase: 41 U/L (ref 11–51)

## 2017-10-19 LAB — TROPONIN I: Troponin I: 0.03 ng/mL (ref <0.03)

## 2017-10-19 MED ORDER — VANCOMYCIN HCL IN DEXTROSE 750-5 MG/150ML-% IV SOLN
750.0000 mg | Freq: Two times a day (BID) | INTRAVENOUS | Status: DC
  Administered 2017-10-19 – 2017-10-22 (×6): 750 mg via INTRAVENOUS
  Filled 2017-10-19 (×7): qty 150

## 2017-10-19 MED ORDER — LABETALOL HCL 5 MG/ML IV SOLN: 10.0000 mg | Freq: Once | INTRAVENOUS | Status: DC

## 2017-10-19 MED ORDER — LABETALOL HCL 5 MG/ML IV SOLN: 10.0000 mg | Freq: Once | INTRAVENOUS | Status: AC | PRN

## 2017-10-19 MED ORDER — SIMETHICONE 80 MG PO CHEW
80.0000 mg | CHEWABLE_TABLET | Freq: Once | ORAL | Status: AC
  Administered 2017-10-19: 80 mg via ORAL
  Filled 2017-10-19: qty 1

## 2017-10-19 MED ORDER — MORPHINE SULFATE (PF) 2 MG/ML IV SOLN
1.0000 mg | INTRAVENOUS | Status: DC | PRN
  Administered 2017-10-19: 1 mg via INTRAVENOUS
  Filled 2017-10-19: qty 1

## 2017-10-19 MED ORDER — PIPERACILLIN-TAZOBACTAM 3.375 G IVPB
3.3750 g | Freq: Three times a day (TID) | INTRAVENOUS | Status: DC
  Administered 2017-10-19 – 2017-10-22 (×9): 3.375 g via INTRAVENOUS
  Filled 2017-10-19 (×12): qty 50

## 2017-10-19 NOTE — Progress Notes (Signed)
HISTORY OF PRESENT ILLNESS:  [name] is a 72 y.o. male being evaluated for abdominal pain and abnormal liver tests. He states that his abdominal pain is persistent but slightly better than admission. Chief complaint now is shortness of breath. Sitting over the bed short of breath with physical therapy. Interval date including laboratories and x-rays personally reviewed  REVIEW OF SYSTEMS:  Noncontributory  Past Medical History:  Diagnosis Date  . Anxiety and depression   . COPD (chronic obstructive pulmonary disease) ([place])   . GERD (gastroesophageal reflux disease)   . HTN (hypertension)   . Hyperlipemia   . Migraine   . Personal history of alcoholism ([place])   . Tobacco use   . Tubular adenoma 01/29/2008    Past Surgical History:  Procedure Laterality Date  . ANTERIOR FUSION CERVICAL SPINE  2005   two surgeries after two mechanical falls, per Dr. Donald Pore  . COLONOSCOPY W/ BIOPSIES  01/29/2008   Dr. Silvano Rusk  . ESOPHAGOGASTRODUODENOSCOPY N/A 10/15/2017   Procedure: ESOPHAGOGASTRODUODENOSCOPY (EGD);  Surgeon: Irene Shipper, MD;  Location: [facility] ENDOSCOPY;  Service: Endoscopy;  Laterality: N/A;  . [place]   correction for crossed-eyes  . [place]  reports that he has been smoking cigarettes.  He has a 27.50 pack-year smoking history. he has never used smokeless tobacco. He reports that he drinks alcohol. He reports that he does not use drugs.  family history includes Heart attack in his brother, father, mother, and sister; Stroke in his sister.  No Known Allergies     PHYSICAL EXAMINATION: Vital signs: BP 134/75 (BP Location: Left Arm)   Pulse (!) 122   Temp 98.3 F (36.8 C) (Oral)   Resp (!) 22   Ht 5\' 7"  (1.702 m)   Wt 171 lb 15.3 oz (78 kg)   SpO2 (!) 87%   BMI 26.93 kg/m  General: Elderly male who appears ill. Tachypneic HEENT: Sclerae are icteric, conjunctiva pink. Oral mucosa intact Lungs: A  few posterior Rales Heart: Regular with tachycardia Abdomen: soft, superficial wall tenderness, nondistended, no obvious ascites, no peritoneal signs, normal bowel sounds. No organomegaly. Extremities: No clubbing, cyanosis, or edema Psychiatric: alert and oriented x3. Cooperative   ASSESSMENT:  #1. Ongoing abdominal pain. May be neuropathic. Rule out porphyria #2. Rising bilirubin. Suspect intrahepatic cholestasis from infection and/or drug. Abnormal transaminases on admission steadily improving. No evidence for biliary obstruction, liver disease, or gallbladder disease acutely by imaging or otherwise #3. Gastroparesis #4. Pneumonia. Suspect aspiration. On antibiotics #5. Atrial fibrillation with RVR   PLAN:  #1. Await porphobilinogen testing #2. Ongoing management of cardiopulmonary issues per primary service  Will follow. Discussed with Dr. Llana Aliment. Geri Seminole., M.D. [facility] Division of Gastroenterology

## 2017-10-19 NOTE — Progress Notes (Addendum)
Paged to bedside to evaluate dyspnea, increased oxygen requirement (from 2L to 6L in 4 hours), and tachycardia. Patient's nurse (bedside) states that these problems were first noticed around 1pm by nursing staff and have progressively worsened since onset of symptoms.  Patient himself states he feels "woozy". Endorses worsening SOB, inability to lay flat, and feeling like his heart is racing. Denies LE swelling, fevers, chills, worsening abdominal pain, and chest pain.   Patient's telemetry was reviewed, indicated possible A-Fib with HR >140 on multiple occassions in past two hours.  Vitals: Temp = 67F, HR = 110s-120s, O2 saturation 89-91% on 6L [place] Gen: Diaphoretic and appeared uncomfortable but in no acute distress CV: Tachycardia without murmur or rub. 2+ radial pulses, posterior tibial bilaterally Pulm: Tachypneic while speaking in full sentences. Minimal accessory muscle use and nasal flaring. No wheezing or crackles appreciated MSK: Bilateral lower extremities without pitting edema; SCD's in place but not plugged in  Assessment/Plan: [name] is a 72 yo with PMH of COPD who initially presented with abdominal pain but was found to have pneumonia during hospitalization. Recently switched from broad spectrum antibiotics to Unasyn yesterday. Patient's acute oxygen requirement is unclear at this point. Patient's antibiotics recently changed, but patient remains afebrile suggesting clinical change is not 2/2 infectious process. Patient does not have history of A-Fib or CHF per chart review. He doesn't look volume overloaded on exam, however does endorse dyspnea on exertion and palpitations. Will obtain chest X ray to assess volume status/new infiltration, EKG to assess for A-Fib/ACS, ABG, and troponin to assess for ACS. Slightly concerned about PE given acute clinical change, given change in oxygen requirement and tachycardia. Patient appears to be getting VTE prophylaxis with SCDs, but not wearing  them while in bed during exam. If above testing doesn't reveal much, could consider CTA. Patient looked diaphoretic during exam, I'm worried about worsening pneumonia. If patient spikes fever, would obtain blood cultures x2 and rebroaden antibiotics.   Transfer patient to step-down status given increased oxygen requirement.

## 2017-10-19 NOTE — Progress Notes (Addendum)
   Subjective: Patient feels worse this morning.  States that his abdominal pain and distention are getting worse.  He feels similar to when he presented.  He continues to struggle with pain.  Is very unhappy this morning.  He did not tolerate much p.o. intake overnight or this morning.  We discussed that we have switched his antibiotics, and are going to continue to monitor for clinical improvement.  He voices understanding.  All questions and concerns addressed.  Objective: Vital signs in last 24 hours: Vitals:   10/18/17 2014 10/18/17 2015 10/18/17 2200 10/19/17 0535  BP:   139/75 (!) 152/80  Pulse:  (!) 115 (!) 109 (!) 111  Resp:  20 19 20   Temp:   99.4 F (37.4 C)   TempSrc:      SpO2: 91%  93% 90%  Weight:      Height:       General: Well-nourished male with jaundice appearance Pulm: Diminished air movement, no crackles heard today. CV: Tachycardic, no murmurs or rubs heard Abdomen: Hypoactive bowel sounds, tenderness to palpation in the epigastric region, does feel more distended and firm today Extremities: Warm and dry, no lower extremity edema  Assessment/Plan:  [name] is a 72 year old male who presented with acute onset abdominal pain and nausea/vomiting.  Workup revealed cholestatic liver injury, increased stool burden, and bilateral upper lobe multifocal pneumonia.  He continues to show minimal clinical improvement over the interval.  Sepsis 2/2 Multifocal PNA - Patient continues to have cough and be tachycardic. Afebrile for >24 hours - Labs pending including sputum culture, sputum AFB, IgE.  - Blood culture showing no growth to date. - Appreciate pulmonology and infectious disease consultations.  Have transitioned the patient to Unasyn and will continue to monitor for clinical improvement. - Patient will need outpatient follow-up with pulmonology and repeat chest CT without contrast in 3 months.  Abdominal Pain. - Abdominal pain and distention continue to  improve. The patient is tolerating p.o. Intake. - Urine porphobilinogen pending  - Bilirubin continues to increase -Maintaining K >4.0  - Appreciate GIs recommendations. CT abdomen on 1/10 did show some mild intrahepatic biliary ductal dilation and therefore the patient would benefit from MRCP  Hyperbilirubinemia - Patient continues to have increase in bilirubin.  Fractionation showing primarily in direct bilirubin. This is consistent with a post hepatic process. We are checking a UA to assess for urobilinogen to ensure there is not obstruction. - Antimitochondrial antibodies pending  Normocytic Anemia and Thrombocytopenia  -Continue to improve. Likely due to acute illness.  Sinus Tachycardia. -Review of telemetry showing sinus tachycardia with multiple PVCs.  Chronic Lumbar Back Pain  - Continuing Gabapentin  - Holding Meloxicam, Norco, and Tizanidine   Migraine Headaches  - Patient uses excessive amounts of NSAIDs for both his migraines and chronic back pain - Will consider prophylaxis with a beta blocker   COPD - Continue Spiriva with albuterol PRN  Hyperlipidemia  - Hold Atorvastatin but will resume on discharge  -LDL 50  Dispo: Anticipated discharge in approximately >1 day(s).   Ina Homes, MD 10/19/2017, 6:07 AM My Pager: [phone]

## 2017-10-19 NOTE — Evaluation (Addendum)
Physical Therapy Evaluation Patient Details Name: [name] MRN: [id number] DOB: [birth year] Today's Date: 10/19/2017   History of Present Illness  Pt is a 72 y/o male admitted secondary to abdominal pain. Workup revealed cholestatic liver injury, increased stool burden, and bilateral upper lobe multifocal pneumonia. PMH including but not limited to COPD, HTN and tobacco abuse.    Clinical Impression  Pt presented supine in bed with HOB elevated, awake and willing to participate in therapy session. Prior to admission, pt reported that he was independent with all functional mobility and ADLs. Pt lives with his cousin(s) who can provide assistance PRN/intermittently. Pt currently very limited secondary to abdominal pain, elevated HR with activity and significant decrease in SPO2 requiring increase in supplemental O2 to 6L. See below for details. Pt's RN was notified and present in room at end of session. Pt would continue to benefit from skilled physical therapy services at this time while admitted and after d/c to address the below listed limitations in order to improve overall safety and independence with functional mobility.     Follow Up Recommendations SNF;Supervision/Assistance - 24 hour;Other (comment)(hopeful pt may be able to progress to return home with HHPT)    Equipment Recommendations  None recommended by PT    Recommendations for Other Services       Precautions / Restrictions Precautions Precautions: Fall Precaution Comments: watch SPO2 and HR Restrictions Weight Bearing Restrictions: No      Mobility  Bed Mobility Overal bed mobility: Needs Assistance Bed Mobility: Supine to Sit;Sit to Supine     Supine to sit: Min guard Sit to supine: Min guard   General bed mobility comments: increased time and effort, min guard for safety  Transfers Overall transfer level: Needs assistance Equipment used: None Transfers: Sit to/from Stand Sit to Stand: Supervision          General transfer comment: supervision for safety  Ambulation/Gait             General Gait Details: deferred at this time secondary to pt's HR elevating to mid 130's and SPO2 decreasing to low 70's; pt on 2L of O2 initially and therapist titrated up to 6L of O2 with SPO2 achieving low 90's after several minutes  Stairs            Wheelchair Mobility    Modified Rankin (Stroke Patients Only)       Balance Overall balance assessment: Needs assistance Sitting-balance support: Feet supported Sitting balance-Leahy Scale: Good                                       Pertinent Vitals/Pain Pain Assessment: 0-10 Pain Score: 10-Worst pain ever Pain Location: stomach Pain Descriptors / Indicators: Sharp;Shooting Pain Intervention(s): Monitored during session    Home Living Family/patient expects to be discharged to:: Private residence Living Arrangements: Other relatives Available Help at Discharge: Family;Available 24 hours/day Type of Home: House Home Access: Level entry     Home Layout: One level Home Equipment: Shower seat;Cane - single point      Prior Function Level of Independence: Independent               Hand Dominance        Extremity/Trunk Assessment   Upper Extremity Assessment Upper Extremity Assessment: Overall WFL for tasks assessed    Lower Extremity Assessment Lower Extremity Assessment: Overall WFL for tasks assessed  Communication   Communication: No difficulties  Cognition Arousal/Alertness: Awake/alert Behavior During Therapy: WFL for tasks assessed/performed Overall Cognitive Status: Within Functional Limits for tasks assessed                                        General Comments      Exercises     Assessment/Plan    PT Assessment Patient needs continued PT services  PT Problem List Decreased activity tolerance;Decreased balance;Decreased mobility;Decreased  coordination;Decreased safety awareness;Cardiopulmonary status limiting activity;Pain       PT Treatment Interventions DME instruction;Gait training;Stair training;Functional mobility training;Therapeutic activities;Therapeutic exercise;Balance training;Neuromuscular re-education;Patient/family education    PT Goals (Current goals can be found in the Care Plan section)  Acute Rehab PT Goals Patient Stated Goal: return home PT Goal Formulation: With patient Time For Goal Achievement: 11/02/17 Potential to Achieve Goals: Good    Frequency Min 3X/week   Barriers to discharge        Co-evaluation               AM-PAC PT "6 Clicks" Daily Activity  Outcome Measure Difficulty turning over in bed (including adjusting bedclothes, sheets and blankets)?: A Little Difficulty moving from lying on back to sitting on the side of the bed? : A Little Difficulty sitting down on and standing up from a chair with arms (e.g., wheelchair, bedside commode, etc,.)?: Unable Help needed moving to and from a bed to chair (including a wheelchair)?: A Little Help needed walking in hospital room?: A Lot Help needed climbing 3-5 steps with a railing? : A Lot 6 Click Score: 14    End of Session Equipment Utilized During Treatment: Oxygen Activity Tolerance: Treatment limited secondary to medical complications (Comment);Other (comment)(elevated HR, SPO2 decreasing to mid 70's sitting EOB) Patient left: in bed;with call bell/phone within reach;with bed alarm set Nurse Communication: Mobility status;Other (comment)(HR elevated and SPO2 decreasing as low as mid 70's) PT Visit Diagnosis: Other abnormalities of gait and mobility (R26.89)    Time: 1343-1406 PT Time Calculation (min) (ACUTE ONLY): 23 min   Charges:   PT Evaluation $PT Eval Moderate Complexity: 1 Mod PT Treatments $Therapeutic Activity: 8-22 mins   PT G Codes:        Hamilton, PT, DPT [place] 10/19/2017,  3:56 PM

## 2017-10-19 NOTE — Progress Notes (Signed)
Pharmacy Antibiotic Note  [name] is a 72 y.o. male admitted on 10/14/2017 with pneumonia.    Plan: Resume vanc 750 q12 Zosyn 3.375 q8  Height: 5\' 7"  (170.2 cm) Weight: 171 lb 15.3 oz (78 kg) IBW/kg (Calculated) : 66.1  Temp (24hrs), Avg:99.5 F (37.5 C), Min:98.3 F (36.8 C), Max:100.9 F (38.3 C)  Recent Labs  Lab 10/15/17 0250 10/16/17 0237 10/17/17 0333 10/17/17 1507 10/18/17 0207 10/19/17 0531  WBC 8.5 6.6 5.6  --  8.1 10.5  CREATININE 1.26* 1.13 1.11  --  0.99 0.93  LATICACIDVEN  --   --   --  1.4  --   --     Estimated Creatinine Clearance: 68.1 mL/min (by C-G formula based on SCr of 0.93 mg/dL).    No Known Allergies  Levester Fresh, PharmD, BCPS, BCCCP Clinical Pharmacist Clinical phone for 10/19/2017 from 7a-3:30p: [phone] If after 3:30p, please call main pharmacy at: [phone] 10/19/2017 7:07 PM

## 2017-10-19 NOTE — Progress Notes (Signed)
Patient ID: [name], male   DOB: 1946/06/01, 72 y.o.   MRN: [id number]          [facility] for Infectious Disease  Date of Admission:  10/14/2017    Total days of antibiotics 6        Day 2 ampicillin sulbactam         ASSESSMENT: He remains unclear what caused his acute, severe abdominal pain, elevated liver enzymes, hyperbilirubinemia, thrombocytopenia, fever and pulmonary infiltrates.  His liver enzymes are coming down but his bilirubin continues to increase.  His platelets are coming back to normal.  He has not had any recorded fever overnight and his chest x-ray looks better today.  His initial GI panel was positive for enteropathogenic E. coli.  He has been constipated without any diarrhea.  I do not think that enteropathogenic E. coli is the culprit here.  I will continue ampicillin sulbactam for now.  PLAN: 1. Continue ampicillin sulbactam  Principal Problem:   Abdominal pain Active Problems:   Hepatitis   Cholestasis   Thrombocytopenia (HCC)   Pulmonary infiltrates   Fever   COPD (chronic obstructive pulmonary disease) (HCC)   Gastroparesis   Abnormal chest CT   Acute respiratory distress   Acute respiratory alkalosis   Mediastinal lymphadenopathy   Pleural effusion, bilateral   Scheduled Meds: . albuterol  2.5 mg Nebulization QID  . chlorhexidine  15 mL Mouth Rinse BID  . feeding supplement (GLUCERNA SHAKE)  237 mL Oral BID BM  . gabapentin  300 mg Oral BID  . mouth rinse  15 mL Mouth Rinse q12n4p  . pantoprazole  40 mg Oral Daily  . sodium chloride flush  3 mL Intravenous Q12H  . tiotropium  18 mcg Inhalation Daily   Continuous Infusions: . sodium chloride    . ampicillin-sulbactam (UNASYN) IV Stopped (10/19/17 1049)  . dexrose 5 % and 0.45 % NaCl with KCl 30 mEq/L 125 mL/hr at 10/19/17 1029   PRN Meds:.sodium chloride, acetaminophen, albuterol, chlorproMAZINE, morphine injection, ondansetron (ZOFRAN) IV, sodium chloride  flush   SUBJECTIVE: He says that he is not feeling well today.  He states that he is still bothered by cough productive of pink sputum and shortness of breath.  He rates his abdominal pain of 10.  He has not had any nausea or vomiting.  He says he has not had a bowel movement in 2 days.  Review of Systems: Review of Systems  Constitutional: Negative for chills, diaphoresis and fever.  Respiratory: Positive for cough, sputum production and shortness of breath. Negative for hemoptysis.   Cardiovascular: Negative for chest pain.  Gastrointestinal: Positive for abdominal pain and constipation. Negative for diarrhea, nausea and vomiting.    No Known Allergies  OBJECTIVE: Vitals:   10/19/17 0535 10/19/17 0940 10/19/17 0942 10/19/17 1334  BP: (!) 152/80   134/75  Pulse: (!) 111   (!) 122  Resp: 20   (!) 22  Temp:    98.3 F (36.8 C)  TempSrc:    Oral  SpO2: 90% 90% 90% (!) 87%  Weight: 171 lb 15.3 oz (78 kg)     Height:       Body mass index is 26.93 kg/m.  Physical Exam  Constitutional: He is oriented to person, place, and time.  He is resting quietly in bed.  He appears worried.  Cardiovascular: Regular rhythm.  No murmur heard. He is tachycardic.  Pulmonary/Chest: Effort normal. He has no wheezes. He has no rales.  His lungs are clear today.  Abdominal: Soft. He exhibits distension. There is tenderness.  Neurological: He is alert and oriented to person, place, and time.  Skin: No rash noted.  Psychiatric: Mood and affect normal.    Lab Results Lab Results  Component Value Date   WBC 10.5 10/19/2017   HGB 12.6 (L) 10/19/2017   HCT 35.5 (L) 10/19/2017   MCV 90.6 10/19/2017   PLT 130 (L) 10/19/2017    Lab Results  Component Value Date   CREATININE 0.93 10/19/2017   BUN 6 10/19/2017   NA 135 10/19/2017   K 4.7 10/19/2017   CL 107 10/19/2017   CO2 20 (L) 10/19/2017    Lab Results  Component Value Date   ALT 49 10/19/2017   AST 53 (H) 10/19/2017   ALKPHOS 122  10/19/2017   BILITOT 12.2 (H) 10/19/2017     Microbiology: Recent Results (from the past 240 hour(s))  Blood culture (routine x 2)     Status: None (Preliminary result)   Collection Time: 10/15/17  6:30 AM  Result Value Ref Range Status   Specimen Description BLOOD LEFT HAND  Final   Special Requests   Final    BOTTLES DRAWN AEROBIC AND ANAEROBIC Blood Culture results may not be optimal due to an inadequate volume of blood received in culture bottles   Culture NO GROWTH 4 DAYS  Final   Report Status PENDING  Incomplete  Blood culture (routine x 2)     Status: None (Preliminary result)   Collection Time: 10/15/17  6:45 AM  Result Value Ref Range Status   Specimen Description BLOOD RIGHT HAND  Final   Special Requests   Final    BOTTLES DRAWN AEROBIC AND ANAEROBIC Blood Culture adequate volume   Culture NO GROWTH 4 DAYS  Final   Report Status PENDING  Incomplete  Culture, blood (routine x 2)     Status: None (Preliminary result)   Collection Time: 10/17/17  4:25 AM  Result Value Ref Range Status   Specimen Description BLOOD LEFT ANTECUBITAL  Final   Special Requests   Final    BOTTLES DRAWN AEROBIC AND ANAEROBIC Blood Culture adequate volume   Culture NO GROWTH 2 DAYS  Final   Report Status PENDING  Incomplete  Culture, blood (routine x 2)     Status: None (Preliminary result)   Collection Time: 10/17/17  4:25 AM  Result Value Ref Range Status   Specimen Description BLOOD LEFT HAND  Final   Special Requests   Final    BOTTLES DRAWN AEROBIC ONLY Blood Culture adequate volume   Culture NO GROWTH 2 DAYS  Final   Report Status PENDING  Incomplete  Culture, expectorated sputum-assessment     Status: None   Collection Time: 10/17/17 10:50 AM  Result Value Ref Range Status   Specimen Description SPUTUM  Final   Special Requests NONE  Final   Sputum evaluation   Final    Sputum specimen not acceptable for testing.  Please recollect.   Gram Stain Report Called to,Read Back By and  Verified With: A FOWLER,RN AT 9518 10/17/17 BY L BENFIELD    Report Status 10/17/2017 FINAL  Final  Gastrointestinal Panel by PCR , Stool     Status: Abnormal   Collection Time: 10/17/17  8:47 PM  Result Value Ref Range Status   Campylobacter species NOT DETECTED NOT DETECTED Final   Plesimonas shigelloides NOT DETECTED NOT DETECTED Final   Salmonella species NOT DETECTED NOT DETECTED  Final   Yersinia enterocolitica NOT DETECTED NOT DETECTED Final   Vibrio species NOT DETECTED NOT DETECTED Final   Vibrio cholerae NOT DETECTED NOT DETECTED Final   Enteroaggregative E coli (EAEC) NOT DETECTED NOT DETECTED Final   Enteropathogenic E coli (EPEC) DETECTED (A) NOT DETECTED Final    Comment: RESULT CALLED TO, READ BACK BY AND VERIFIED WITH: [facility] JONES 10/18/17 @ 1813 MLK    Enterotoxigenic E coli (ETEC) NOT DETECTED NOT DETECTED Final   Shiga like toxin producing E coli (STEC) NOT DETECTED NOT DETECTED Final   Shigella/Enteroinvasive E coli (EIEC) NOT DETECTED NOT DETECTED Final   Cryptosporidium NOT DETECTED NOT DETECTED Final   Cyclospora cayetanensis NOT DETECTED NOT DETECTED Final   Entamoeba histolytica NOT DETECTED NOT DETECTED Final   Giardia lamblia NOT DETECTED NOT DETECTED Final   Adenovirus F40/41 NOT DETECTED NOT DETECTED Final   Astrovirus NOT DETECTED NOT DETECTED Final   Norovirus GI/GII NOT DETECTED NOT DETECTED Final   Rotavirus A NOT DETECTED NOT DETECTED Final   Sapovirus (I, II, IV, and V) NOT DETECTED NOT DETECTED Final    Comment: Performed at [facility], [address] [place], [place] [address]  Respiratory Panel by PCR     Status: None   Collection Time: 10/17/17  8:47 PM  Result Value Ref Range Status   Adenovirus NOT DETECTED NOT DETECTED Final   Coronavirus 229E NOT DETECTED NOT DETECTED Final   Coronavirus HKU1 NOT DETECTED NOT DETECTED Final   Coronavirus NL63 NOT DETECTED NOT DETECTED Final   Coronavirus OC43 NOT DETECTED NOT DETECTED Final    Metapneumovirus NOT DETECTED NOT DETECTED Final   Rhinovirus / Enterovirus NOT DETECTED NOT DETECTED Final   Influenza A NOT DETECTED NOT DETECTED Final   Influenza B NOT DETECTED NOT DETECTED Final   Parainfluenza Virus 1 NOT DETECTED NOT DETECTED Final   Parainfluenza Virus 2 NOT DETECTED NOT DETECTED Final   Parainfluenza Virus 3 NOT DETECTED NOT DETECTED Final   Parainfluenza Virus 4 NOT DETECTED NOT DETECTED Final   Respiratory Syncytial Virus NOT DETECTED NOT DETECTED Final   Bordetella pertussis NOT DETECTED NOT DETECTED Final   Chlamydophila pneumoniae NOT DETECTED NOT DETECTED Final   Mycoplasma pneumoniae NOT DETECTED NOT DETECTED Final  Culture, expectorated sputum-assessment     Status: None   Collection Time: 10/17/17  9:45 PM  Result Value Ref Range Status   Specimen Description EXPECTORATED SPUTUM  Final   Special Requests NONE  Final   Sputum evaluation THIS SPECIMEN IS ACCEPTABLE FOR SPUTUM CULTURE  Final   Report Status 10/18/2017 FINAL  Final  Culture, respiratory (NON-Expectorated)     Status: None (Preliminary result)   Collection Time: 10/17/17  9:45 PM  Result Value Ref Range Status   Specimen Description EXPECTORATED SPUTUM  Final   Special Requests NONE Reflexed from [id number]  Final   Gram Stain   Final    MODERATE WBC PRESENT, PREDOMINANTLY MONONUCLEAR FEW SQUAMOUS EPITHELIAL CELLS PRESENT FEW BUDDING YEAST SEEN RARE GRAM POSITIVE COCCI IN PAIRS    Culture FEW YEAST IDENTIFICATION TO FOLLOW   Final   Report Status PENDING  Incomplete    Michel Bickers, [facility] [phone] pager   [phone] cell 10/19/2017, 2:50 PM

## 2017-10-19 NOTE — Progress Notes (Signed)
  Date: 10/19/2017  Patient name: [name]  Medical record number: [id number]  Date of birth: 04/22/1946   I have seen and evaluated this patient and I have discussed the plan of care with the house staff. Please see their note for complete details. I concur with their findings with the following additions/corrections:   Unfortunately, [name] again looks a little worse today. This morning, he was complaining of increasing abdominal distention and pain, worse than yesterday and nearly as bad as when he came in. He is still not able to eat or drink much. He was also more tachypneic this morning, and this afternoon he began to desat requiring an increase in his oxygen to 6 L nasal cannula, followed by Venturi mask at the most recent reevaluation. Exam is still significant for tachycardia, worsening tachypnea, relatively clear lungs, abdominal distention with upper abdominal tenderness, and increasing jaundice of his skin and eyes.  Labs today show improving thrombocytopenia, worsening direct hyperbilirubinemia (7.9 direct, 4.3 indirect, 12.2 total), overall improvement of his AST, ALTs, and alkaline phosphatase, ongoing hypoalbuminemia, and ongoing mild acidosis with normal anion gap. Blood cultures remain negative. Chest x-ray shows improvement of the previously seen consolidations, but ongoing interstitial opacities. Abdominal x-ray is suggestive of an ileus.  Still difficult to put together his overall clinical picture under unifying diagnosis. His worsening abdominal pain, distention, and ileus on x-ray today combined with his preceding cholestatic picture bring to mind gallstone ileus. However, this would not explain his pulmonary findings and worsening hypoxia, unless he has an ARDS type picture due to sepsis. Also unclear why his blood cultures remain negative if his initial presentation was sepsis due to cholangitis.  At this point, aspiration seems unlikely as the underlying  etiology as he should've responded to Zosyn and now Unasyn. If he continues to deteriorate, we may need to reconsult pulmonology and consider bronchoscopy.  Oda Kilts, MD 10/19/2017, 6:02 PM

## 2017-10-20 DIAGNOSIS — R109 Unspecified abdominal pain: Secondary | ICD-10-CM

## 2017-10-20 DIAGNOSIS — A040 Enteropathogenic Escherichia coli infection: Secondary | ICD-10-CM | POA: Diagnosis present

## 2017-10-20 DIAGNOSIS — E441 Mild protein-calorie malnutrition: Secondary | ICD-10-CM

## 2017-10-20 DIAGNOSIS — R000 Tachycardia, unspecified: Secondary | ICD-10-CM

## 2017-10-20 DIAGNOSIS — R0902 Hypoxemia: Secondary | ICD-10-CM

## 2017-10-20 LAB — CULTURE, BLOOD (ROUTINE X 2)
Culture: NO GROWTH
Culture: NO GROWTH
Special Requests: ADEQUATE

## 2017-10-20 LAB — HEPATIC FUNCTION PANEL
ALT: 46 U/L (ref 17–63)
AST: 62 U/L — ABNORMAL HIGH (ref 15–41)
Albumin: 1.9 g/dL — ABNORMAL LOW (ref 3.5–5.0)
Alkaline Phosphatase: 131 U/L — ABNORMAL HIGH (ref 38–126)
Bilirubin, Direct: 9.3 mg/dL — ABNORMAL HIGH (ref 0.1–0.5)
Indirect Bilirubin: 5.1 mg/dL — ABNORMAL HIGH (ref 0.3–0.9)
Total Bilirubin: 14.4 mg/dL — ABNORMAL HIGH (ref 0.3–1.2)
Total Protein: 6 g/dL — ABNORMAL LOW (ref 6.5–8.1)

## 2017-10-20 LAB — BLOOD GAS, ARTERIAL
Acid-base deficit: 4.3 mmol/L — ABNORMAL HIGH (ref 0.0–2.0)
Bicarbonate: 18.9 mmol/L — ABNORMAL LOW (ref 20.0–28.0)
DELIVERY SYSTEMS: POSITIVE
DRAWN BY: 406621
Expiratory PAP: 6
FIO2: 40
Inspiratory PAP: 12
MODE: POSITIVE
O2 Saturation: 97.4 %
PH ART: 7.463 — AB (ref 7.350–7.450)
PO2 ART: 90.1 mmHg (ref 83.0–108.0)
Patient temperature: 98.6
RATE: 8 resp/min
pCO2 arterial: 26.7 mmHg — ABNORMAL LOW (ref 32.0–48.0)

## 2017-10-20 LAB — CBC WITH DIFFERENTIAL/PLATELET
Basophils Absolute: 0 10*3/uL (ref 0.0–0.1)
Basophils Relative: 0 %
Eosinophils Absolute: 0.2 10*3/uL (ref 0.0–0.7)
Eosinophils Relative: 2 %
HCT: 33.4 % — ABNORMAL LOW (ref 39.0–52.0)
Hemoglobin: 11.9 g/dL — ABNORMAL LOW (ref 13.0–17.0)
Lymphocytes Relative: 13 %
Lymphs Abs: 1.6 10*3/uL (ref 0.7–4.0)
MCH: 32.4 pg (ref 26.0–34.0)
MCHC: 35.6 g/dL (ref 30.0–36.0)
MCV: 91 fL (ref 78.0–100.0)
Monocytes Absolute: 0.8 10*3/uL (ref 0.1–1.0)
Monocytes Relative: 7 %
Neutro Abs: 9.5 10*3/uL — ABNORMAL HIGH (ref 1.7–7.7)
Neutrophils Relative %: 78 %
Platelets: 172 10*3/uL (ref 150–400)
RBC: 3.67 MIL/uL — ABNORMAL LOW (ref 4.22–5.81)
RDW: 13.3 % (ref 11.5–15.5)
WBC: 12.1 10*3/uL — ABNORMAL HIGH (ref 4.0–10.5)

## 2017-10-20 LAB — BASIC METABOLIC PANEL
Anion gap: 9 (ref 5–15)
BUN: 9 mg/dL (ref 6–20)
CO2: 18 mmol/L — ABNORMAL LOW (ref 22–32)
Calcium: 8 mg/dL — ABNORMAL LOW (ref 8.9–10.3)
Chloride: 106 mmol/L (ref 101–111)
Creatinine, Ser: 0.87 mg/dL (ref 0.61–1.24)
GFR calc Af Amer: >60 mL/min (ref >60)
GFR calc non Af Amer: >60 mL/min (ref >60)
Glucose, Bld: 124 mg/dL — ABNORMAL HIGH (ref 65–99)
Potassium: 4.2 mmol/L (ref 3.5–5.1)
Sodium: 133 mmol/L — ABNORMAL LOW (ref 135–145)

## 2017-10-20 LAB — CULTURE, RESPIRATORY W GRAM STAIN

## 2017-10-20 LAB — IGE: IGE (IMMUNOGLOBULIN E), SERUM: 678 [IU]/mL — AB (ref 0–100)

## 2017-10-20 LAB — C-REACTIVE PROTEIN: CRP: 26.3 mg/dL — ABNORMAL HIGH (ref <1.0)

## 2017-10-20 LAB — ACID FAST SMEAR (AFB, MYCOBACTERIA)

## 2017-10-20 LAB — ACID FAST SMEAR (AFB): ACID FAST SMEAR - AFSCU2: NEGATIVE

## 2017-10-20 LAB — FERRITIN: Ferritin: 1118 ng/mL — ABNORMAL HIGH (ref 24–336)

## 2017-10-20 LAB — LACTATE DEHYDROGENASE: LDH: 440 U/L — ABNORMAL HIGH (ref 98–192)

## 2017-10-20 MED ORDER — LEVOFLOXACIN IN D5W 750 MG/150ML IV SOLN
750.0000 mg | INTRAVENOUS | Status: DC
  Administered 2017-10-20 – 2017-10-21 (×2): 750 mg via INTRAVENOUS
  Filled 2017-10-20 (×3): qty 150

## 2017-10-20 MED ORDER — MORPHINE SULFATE (PF) 4 MG/ML IV SOLN: 1.0000 mg | INTRAVENOUS | Status: DC | PRN

## 2017-10-20 MED ORDER — BUDESONIDE 0.25 MG/2ML IN SUSP
0.2500 mg | Freq: Two times a day (BID) | RESPIRATORY_TRACT | Status: DC
  Administered 2017-10-20 – 2017-10-22 (×5): 0.25 mg via RESPIRATORY_TRACT
  Filled 2017-10-20 (×6): qty 2

## 2017-10-20 NOTE — Progress Notes (Addendum)
[facility] Pulmonary Diseases & Critical Care Medicine Progress Note  Patient Name: [name] MRN: [id number] DOB: 05/17/1946    ADMISSION DATE:  10/14/2017 CONSULTATION DATE:  10/17/2017  REFERRING MD:  Dr. Ovid Curd [place]/Dr. Lenice Pressman  REASON FOR CONSULTATION:  Abnormal chest CT/bilateral ground glass opacities   ASSESSMENT/PLAN:  ASSESSMENT (included in the Hospital Problem List)  Patient Active Problem List   Diagnosis Date Noted  . Enteropathogenic Escherichia coli infection 10/20/2017    Priority: High  . Abnormal chest CT 10/17/2017    Priority: High  . Pulmonary infiltrates 10/17/2017    Priority: High  . Acute respiratory distress 10/17/2017    Priority: High  . Acute respiratory alkalosis 10/17/2017    Priority: High  . Abdominal pain 10/14/2017    Priority: High  . Mediastinal lymphadenopathy 10/17/2017    Priority: Medium  . Pleural effusion, bilateral 10/17/2017    Priority: Medium  . Hepatitis 10/15/2017    Priority: Medium  . Cholestasis 10/15/2017    Priority: Medium  . Abdominal distention   . Fever 10/18/2017  . Thrombocytopenia ([place]) 10/16/2017  . Gastroparesis   . Elevated liver function tests   . Intractable cyclical vomiting with nausea   . Acute low back pain 06/27/2017  . Osteoporosis 06/27/2017  . Trigger little finger of left hand 04/12/2017  . Abnormal CT of liver 08/13/2016  . Leg cramps 08/13/2016  . Stable angina ([place]) 08/13/2016  . OSA (obstructive sleep apnea) 08/23/2014  . Hx of migraines 05/26/2014  . Back pain, chronic 05/26/2014  . History of colonic polyps 12/29/2012  . Hyperlipemia 11/12/2012  . COPD (chronic obstructive pulmonary disease) ([place]) 11/12/2012  . Leg swelling 11/12/2012  . HTN (hypertension) 11/12/2012  . Tobacco use 11/12/2012  . Healthcare maintenance 11/12/2012    PLAN/RECOMMENDATIONS  Transfer patient to a unit that can handle BiPAP and perhaps Precedex infusion. Continue BiPAP for  now. Check ABG in 1 hour. If he continues to demonstrate acute respiratory alkalosis, may need to start Precedex. CXR in am. Add Levaquin. Continue albuterol. Start budesonide. Hold Spiriva. I have confirmed the patient's CODE STATUS. While the current plan is to try BiPAP support. He is agreeable to endotracheal intubation and mechanical ventilatory support as needed in the event of further deterioration.     SUBJECTIVE:   -Interim events: was on 12 LPM this morning with oxygen desaturation and increased respiratory distress. Now on BIPAP 12/6. ABG on high-flow showed acute respiratory alkalosis, compatible with the tachypnea associated with his respiratory distress. He does have an elevated A-a gradient, consistent with progression of his airspace disease. He has had no bouts of emesis in the past 24 hours. He endorses cough with sputum production. His respiratory rate reportedly went as high as 50, but he reports feeling much more comfortable on BiPAP, and RR is now 25. On Zosyn/vancomycin for aspiration pneumonia with sputum culture showing NGTD. Pearletha Furl was positive for enteropathogenic E coli. WBC still rising. T-bili still rising.  HISTORY OF PRESENT ILLNESS This 72 y.o. Caucasian male smoker was originally seen on 10/17/2017 in consultation at the request of Dr. Lenice Pressman for recommendations on further evaluation and management of an abnormal chest CT. He initially presented on 10/14/2017 for EGD to further evaluate complaints of abdominal pain, vomiting and pinkish emesis that started the night before presentation, with a recent history in increased NSAID use (Goody powder, meloxicam, aspirin) consumption and elevated LFTs. He had multiple bouts of emesis. The patient reports that he  is certain that he also had a gross aspiration event.  REVIEW OF SYSTEMS Constitutional: No weight loss. No night sweats. No fever. No chills. No fatigue. HEENT: No headaches, dysphagia, sore throat,  otalgia, nasal congestion, PND CV:  No chest pain, orthopnea, PND, swelling in lower extremities, palpitations GI:  Abdominal pain (diffuse). Nausea with multiple bouts of emesis. No diarrhea, change in bowel pattern, anorexia. Resp: No DOE at baseline but now experiencing mild exertional dyspnea. At baseline, claims to be able to walk 5 miles. No cough. No sputum production. No hemoptysis. Uses "inhalers" for COPD. GU: no dysuria, change in color of urine, no urgency or frequency.  No flank pain. MS:  No joint pain or swelling. No myalgias,  No decreased range of motion.  Psych:  No change in mood or affect. No memory loss. Skin: Jaundice  PAST MEDICAL/SURGICAL HISTORY  Past Medical History:  Diagnosis Date  . Anxiety and depression   . COPD (chronic obstructive pulmonary disease) ([place])   . GERD (gastroesophageal reflux disease)   . HTN (hypertension)   . Hyperlipemia   . Migraine   . Personal history of alcoholism ([place])   . Tobacco use   . Tubular adenoma 01/29/2008    Past Surgical History:  Procedure Laterality Date  . ANTERIOR FUSION CERVICAL SPINE  2005   two surgeries after two mechanical falls, per Dr. Donald Pore  . COLONOSCOPY W/ BIOPSIES  01/29/2008   Dr. Silvano Rusk  . ESOPHAGOGASTRODUODENOSCOPY N/A 10/15/2017   Procedure: ESOPHAGOGASTRODUODENOSCOPY (EGD);  Surgeon: Irene Shipper, MD;  Location: [facility] ENDOSCOPY;  Service: Endoscopy;  Laterality: N/A;  . [place]   correction for crossed-eyes  . [place]    Prior to Admission medications   Medication Sig Start Date End Date Taking? Authorizing Provider  acetaminophen (TYLENOL) 325 MG tablet Take 2 tablets (650 mg total) by mouth every 6 (six) hours as needed for mild pain or moderate pain. 06/25/17  Yes Waynetta Pean, PA-C  alendronate (FOSAMAX) 70 MG tablet Take 1 tablet (70 mg total) by mouth every 7 (seven) days. Take with a full glass of water on an empty stomach. 06/27/17 06/27/18 Yes Alphonzo Grieve, MD  aspirin EC 81 MG tablet Take 1 tablet (81 mg total) by mouth daily. 04/12/17 04/12/18 Yes Lorella Nimrod, MD  atorvastatin (LIPITOR) 20 MG tablet Take 1 tablet (20 mg total) by mouth daily. 10/03/17  Yes Lorella Nimrod, MD  Diclofenac Sodium 3 % GEL Place 1 application onto the skin 2 (two) times daily. To affected area. 06/25/17  Yes Waynetta Pean, PA-C  furosemide (LASIX) 20 MG tablet Take 1 tablet (20 mg total) by mouth daily. For leg swelling. 08/02/17 08/02/18 Yes Lorella Nimrod, MD  gabapentin (NEURONTIN) 300 MG capsule Take 1 capsule (300 mg total) by mouth 2 (two) times daily. 07/12/17  Yes Lorella Nimrod, MD  HYDROcodone-acetaminophen (NORCO) 5-325 MG tablet Take 1 tablet by mouth every 6 (six) hours as needed for moderate pain. 09/04/17  Yes Rice, Resa Miner, MD  meloxicam (MOBIC) 7.5 MG tablet Take 7.5 mg by mouth daily.   Yes [provider]  Multiple Vitamins-Minerals (CENTRUM SILVER ADULT 50+) TABS Take 1 tablet by mouth daily.   Yes [provider]  PROAIR HFA 108 (90 Base) MCG/ACT inhaler INHALE 2 PUFFS INTO THE LUNGS FOUR TIMES DAILY Patient taking differently: INHALE 2 PUFFS INTO THE LUNGS FOUR TIMES DAILY AS NEEDED FOR SOB. 07/22/17  Yes Lorella Nimrod, MD  tiotropium (  SPIRIVA) 18 MCG inhalation capsule Place 1 capsule (18 mcg total) into inhaler and inhale daily. 02/20/17  Yes Lorella Nimrod, MD  tiZANidine (ZANAFLEX) 2 MG tablet Take 2 mg by mouth every 6 (six) hours as needed for muscle spasms.   Yes [provider]    No Known Allergies   Current Facility-Administered Medications:  .  0.9 %  sodium chloride infusion, 250 mL, Intravenous, PRN, Maryellen Pile, MD .  acetaminophen (TYLENOL) tablet 650 mg, 650 mg, Oral, Q6H PRN, Colbert Ewing, MD, 650 mg at 10/18/17 2015 .  albuterol (PROVENTIL) (2.5 MG/3ML) 0.083% nebulizer solution 2.5 mg, 2.5 mg, Nebulization, QID, Oda Kilts, MD, 2.5 mg at 10/20/17 0735 .  albuterol (PROVENTIL)  (2.5 MG/3ML) 0.083% nebulizer solution 3 mL, 3 mL, Inhalation, Q4H PRN, Molt, Bethany, DO .  chlorhexidine (PERIDEX) 0.12 % solution 15 mL, 15 mL, Mouth Rinse, BID, Oda Kilts, MD, 15 mL at 10/20/17 0841 .  chlorproMAZINE (THORAZINE) tablet 25 mg, 25 mg, Oral, TID PRN, Ina Homes, MD, 25 mg at 10/16/17 1831 .  dextrose 5 % and 0.45 % NaCl with KCl 30 mEq/L infusion, , Intravenous, Continuous, Vena Rua, PA-C, Last Rate: 125 mL/hr at 10/20/17 0647 .  feeding supplement (GLUCERNA SHAKE) (GLUCERNA SHAKE) liquid 237 mL, 237 mL, Oral, BID BM, Vena Rua, PA-C, 237 mL at 10/18/17 1045 .  gabapentin (NEURONTIN) capsule 300 mg, 300 mg, Oral, BID, Maryellen Pile, MD, 300 mg at 10/20/17 0840 .  MEDLINE mouth rinse, 15 mL, Mouth Rinse, q12n4p, Oda Kilts, MD, 15 mL at 10/19/17 1200 .  morphine 2 MG/ML injection 1 mg, 1 mg, Intravenous, Q4H PRN, Molt, Bethany, DO, 1 mg at 10/19/17 1031 .  ondansetron (ZOFRAN) injection 4 mg, 4 mg, Intravenous, Q8H PRN, Colbert Ewing, MD, 4 mg at 10/16/17 0557 .  pantoprazole (PROTONIX) EC tablet 40 mg, 40 mg, Oral, Daily, Vena Rua, PA-C, 40 mg at 10/20/17 0840 .  piperacillin-tazobactam (ZOSYN) IVPB 3.375 g, 3.375 g, Intravenous, Q8H, Wynell Balloon, RPH, Last Rate: 12.5 mL/hr at 10/20/17 0446, 3.375 g at 10/20/17 0446 .  sodium chloride flush (NS) 0.9 % injection 3 mL, 3 mL, Intravenous, Q12H, Maryellen Pile, MD, 3 mL at 10/19/17 1019 .  sodium chloride flush (NS) 0.9 % injection 3 mL, 3 mL, Intravenous, PRN, Maryellen Pile, MD .  tiotropium [facility]) inhalation capsule 18 mcg, 18 mcg, Inhalation, Daily, Maryellen Pile, MD, 18 mcg at 10/20/17 0735 .  vancomycin (VANCOCIN) IVPB 750 mg/150 ml premix, 750 mg, Intravenous, Q12H, Wynell Balloon, RPH, Last Rate: 150 mL/hr at 10/20/17 0836, 750 mg at 10/20/17 0836   OBJECTIVE:   VITAL SIGNS: BP (!) 141/70   Pulse (!) 109   Temp 97.9 F (36.6 C)   Resp (!) 31   Ht 5\' 7"   (1.702 m)   Wt 78 kg (171 lb 15.3 oz)   SpO2 91%   BMI 26.93 kg/m  Vitals:   10/20/17 0632 10/20/17 0735 10/20/17 0737 10/20/17 0844  BP:      Pulse:      Resp:      Temp: (!) 97.2 F (36.2 C)   97.9 F (36.6 C)  TempSrc: Oral     SpO2:  96% 91%   Weight:      Height:        HEMODYNAMICS:    VENTILATOR SETTINGS: FiO2 (%):  [50 %] 50 %  INTAKE / OUTPUT: I/O last 3 completed shifts: In: 3927.1 [P.O.:600;  I.V.:2877.1; IV Piggyback:450] Out: 1700 [Urine:1700]  PHYSICAL EXAMINATION: General:  Alert, awake and oriented to time, person and place. Cooperative. No acute distress. On 2 LPM. Head: normocephalic, atraumatic EYE: PERRLA, EOM intact. Scleral icterus. No pallor Nose: nares are patent. No polyps. No exudate. No sinus tenderness. Throat/Oral Cavity: No oral thrush. No exudate. Mucous membranes are moist. No tonsillar enlargement. Neck: supple, no thyromegaly, no JVD, no lymphadenopathy. Trachea midline. Chest/Lung: symmetric in development and expansion. Good air entry. Diminished breath sounds in the bases. Bibasilar coarse crackles. No wheezes. Heart: Regular S1 and S2 without murmur, rub or gallop. Abdomen: soft, nontender, nondistended. Normoactive bowel sounds. No rebound. No guarding. Extremities: no pedal edema, no cyanosis, no clubbing. 2+ DP pulses Lymphatic: no cervical/axiallary/inguinal lymph nodes appreciated Skin:  Jaundice. NEURO: cranial nerves II-XII are grossly symmetric and physiologic. Babinski absent. No sensory deficit. No motor deficit. DTR 2+ @ RUE, 2+ @ LUE 2+ @ RLL,  2+ @ LLL. No cerebellar signs. Gait was not assessed.   LABS:  BMET Recent Labs  Lab 10/18/17 0207 10/19/17 0531 10/20/17 0310  NA 135 135 133*  K 3.9 4.7 4.2  CL 109 107 106  CO2 20* 20* 18*  BUN 6 6 9   CREATININE 0.99 0.93 0.87  GLUCOSE 129* 119* 124*    Electrolytes Recent Labs  Lab 10/17/17 2036 10/18/17 0207 10/19/17 0531 10/20/17 0310  CALCIUM  --  7.8*  8.2* 8.0*  MG 1.8  --   --   --     CBC Recent Labs  Lab 10/18/17 0207 10/19/17 0531 10/20/17 0310  WBC 8.1 10.5 12.1*  HGB 12.4* 12.6* 11.9*  HCT 35.4* 35.5* 33.4*  PLT 107* 130* 172    Coag's Recent Labs  Lab 10/17/17 0333  INR 1.09    Sepsis Markers Recent Labs  Lab 10/17/17 1507  LATICACIDVEN 1.4    ABG Recent Labs  Lab 10/17/17 1613 10/19/17 1845  PHART 7.482* 7.443  PCO2ART 27.4* 28.6*  PO2ART 75.5* 88.9    Liver Enzymes Recent Labs  Lab 10/18/17 0207 10/19/17 0531 10/19/17 0907 10/20/17 0310  AST 50* 53*  --  62*  ALT 58 49  --  46  ALKPHOS 106 122  --  131*  BILITOT 8.3* 11.2* 12.2* 14.4*  ALBUMIN 2.0* 2.1*  --  1.9*    Cardiac Enzymes Recent Labs  Lab 10/19/17 1635  TROPONINI 0.03*    Glucose No results for input(s): GLUCAP in the last 168 hours.  Imaging Dg Chest 2 View  Result Date: 10/19/2017 CLINICAL DATA:  Shortness of Breath EXAM: CHEST  2 VIEW COMPARISON:  10/17/2017 FINDINGS: Diffuse interstitial prominence throughout the lungs, with peribronchial thickening. Previously seen areas of consolidation in the upper lobes has improved. Heart is normal size. No effusions. IMPRESSION: Severe diffuse interstitial prominence throughout the lungs. The confluent ground-glass opacities in the upper lobes have improved. Electronically Signed   By: Rolm Baptise M.D.   On: 10/19/2017 12:46   Dg Chest Port 1 View  Result Date: 10/19/2017 CLINICAL DATA:  Shortness of breath and chest pain EXAM: PORTABLE CHEST 1 VIEW COMPARISON:  10/19/2017 FINDINGS: Cardiac shadow is stable. Aortic calcifications are noted. Diffuse interstitial prominence is again identified throughout both lungs stable in appearance from the prior exam. No focal infiltrate or sizable effusion is seen. No bony abnormality is noted. IMPRESSION: Stable interstitial changes bilaterally. Electronically Signed   By: Inez Catalina M.D.   On: 10/19/2017 18:56   Dg Abd 2  Views  Result  Date: 10/19/2017 CLINICAL DATA:  Abdominal pain EXAM: ABDOMEN - 2 VIEW COMPARISON:  10/16/2017 FINDINGS: Gas within nondistended large bowel. Mild gaseous distention of bowel loops in the abdomen. No organomegaly or free air. No suspicious calcification. IMPRESSION: Mild gaseous distention of bowel loops in the abdomen, suspect mild ileus. Electronically Signed   By: Rolm Baptise M.D.   On: 10/19/2017 12:47    CULTURES: Results for orders placed or performed during the hospital encounter of 10/14/17  Blood culture (routine x 2)     Status: None (Preliminary result)   Collection Time: 10/15/17  6:30 AM  Result Value Ref Range Status   Specimen Description BLOOD LEFT HAND  Final   Special Requests   Final    BOTTLES DRAWN AEROBIC AND ANAEROBIC Blood Culture results may not be optimal due to an inadequate volume of blood received in culture bottles   Culture NO GROWTH 4 DAYS  Final   Report Status PENDING  Incomplete  Blood culture (routine x 2)     Status: None (Preliminary result)   Collection Time: 10/15/17  6:45 AM  Result Value Ref Range Status   Specimen Description BLOOD RIGHT HAND  Final   Special Requests   Final    BOTTLES DRAWN AEROBIC AND ANAEROBIC Blood Culture adequate volume   Culture NO GROWTH 4 DAYS  Final   Report Status PENDING  Incomplete  Culture, blood (routine x 2)     Status: None (Preliminary result)   Collection Time: 10/17/17  4:25 AM  Result Value Ref Range Status   Specimen Description BLOOD LEFT ANTECUBITAL  Final   Special Requests   Final    BOTTLES DRAWN AEROBIC AND ANAEROBIC Blood Culture adequate volume   Culture NO GROWTH 2 DAYS  Final   Report Status PENDING  Incomplete  Culture, blood (routine x 2)     Status: None (Preliminary result)   Collection Time: 10/17/17  4:25 AM  Result Value Ref Range Status   Specimen Description BLOOD LEFT HAND  Final   Special Requests   Final    BOTTLES DRAWN AEROBIC ONLY Blood Culture adequate volume   Culture NO  GROWTH 2 DAYS  Final   Report Status PENDING  Incomplete  Culture, expectorated sputum-assessment     Status: None   Collection Time: 10/17/17 10:50 AM  Result Value Ref Range Status   Specimen Description SPUTUM  Final   Special Requests NONE  Final   Sputum evaluation   Final    Sputum specimen not acceptable for testing.  Please recollect.   Gram Stain Report Called to,Read Back By and Verified With: A FOWLER,RN AT [id number] 10/17/17 BY L BENFIELD    Report Status 10/17/2017 FINAL  Final  Gastrointestinal Panel by PCR , Stool     Status: Abnormal   Collection Time: 10/17/17  8:47 PM  Result Value Ref Range Status   Campylobacter species NOT DETECTED NOT DETECTED Final   Plesimonas shigelloides NOT DETECTED NOT DETECTED Final   Salmonella species NOT DETECTED NOT DETECTED Final   Yersinia enterocolitica NOT DETECTED NOT DETECTED Final   Vibrio species NOT DETECTED NOT DETECTED Final   Vibrio cholerae NOT DETECTED NOT DETECTED Final   Enteroaggregative E coli (EAEC) NOT DETECTED NOT DETECTED Final   Enteropathogenic E coli (EPEC) DETECTED (A) NOT DETECTED Final    Comment: RESULT CALLED TO, READ BACK BY AND VERIFIED WITH: [facility] JONES 10/18/17 @ 1813 MLK    Enterotoxigenic E coli (  ETEC) NOT DETECTED NOT DETECTED Final   Shiga like toxin producing E coli (STEC) NOT DETECTED NOT DETECTED Final   Shigella/Enteroinvasive E coli (EIEC) NOT DETECTED NOT DETECTED Final   Cryptosporidium NOT DETECTED NOT DETECTED Final   Cyclospora cayetanensis NOT DETECTED NOT DETECTED Final   Entamoeba histolytica NOT DETECTED NOT DETECTED Final   Giardia lamblia NOT DETECTED NOT DETECTED Final   Adenovirus F40/41 NOT DETECTED NOT DETECTED Final   Astrovirus NOT DETECTED NOT DETECTED Final   Norovirus GI/GII NOT DETECTED NOT DETECTED Final   Rotavirus A NOT DETECTED NOT DETECTED Final   Sapovirus (I, II, IV, and V) NOT DETECTED NOT DETECTED Final    Comment: Performed at [facility], [address] [place], [place] [address]  Respiratory Panel by PCR     Status: None   Collection Time: 10/17/17  8:47 PM  Result Value Ref Range Status   Adenovirus NOT DETECTED NOT DETECTED Final   Coronavirus 229E NOT DETECTED NOT DETECTED Final   Coronavirus HKU1 NOT DETECTED NOT DETECTED Final   Coronavirus NL63 NOT DETECTED NOT DETECTED Final   Coronavirus OC43 NOT DETECTED NOT DETECTED Final   Metapneumovirus NOT DETECTED NOT DETECTED Final   Rhinovirus / Enterovirus NOT DETECTED NOT DETECTED Final   Influenza A NOT DETECTED NOT DETECTED Final   Influenza B NOT DETECTED NOT DETECTED Final   Parainfluenza Virus 1 NOT DETECTED NOT DETECTED Final   Parainfluenza Virus 2 NOT DETECTED NOT DETECTED Final   Parainfluenza Virus 3 NOT DETECTED NOT DETECTED Final   Parainfluenza Virus 4 NOT DETECTED NOT DETECTED Final   Respiratory Syncytial Virus NOT DETECTED NOT DETECTED Final   Bordetella pertussis NOT DETECTED NOT DETECTED Final   Chlamydophila pneumoniae NOT DETECTED NOT DETECTED Final   Mycoplasma pneumoniae NOT DETECTED NOT DETECTED Final  Culture, expectorated sputum-assessment     Status: None   Collection Time: 10/17/17  9:45 PM  Result Value Ref Range Status   Specimen Description EXPECTORATED SPUTUM  Final   Special Requests NONE  Final   Sputum evaluation THIS SPECIMEN IS ACCEPTABLE FOR SPUTUM CULTURE  Final   Report Status 10/18/2017 FINAL  Final  Acid Fast Smear (AFB)     Status: None   Collection Time: 10/17/17  9:45 PM  Result Value Ref Range Status   AFB Specimen Processing Concentration  Final   Acid Fast Smear Negative  Final    Comment: (NOTE) Performed At: [facility] [address] [place], [place] [id number] Rush Farmer MD [id number]    Source (AFB) EXPECTORATED SPUTUM  Final  Culture, respiratory (NON-Expectorated)     Status: None (Preliminary result)   Collection Time: 10/17/17  9:45 PM  Result Value Ref Range Status   Specimen Description  EXPECTORATED SPUTUM  Final   Special Requests NONE Reflexed from [id number]  Final   Gram Stain   Final    MODERATE WBC PRESENT, PREDOMINANTLY MONONUCLEAR FEW SQUAMOUS EPITHELIAL CELLS PRESENT FEW BUDDING YEAST SEEN RARE GRAM POSITIVE COCCI IN PAIRS    Culture FEW YEAST IDENTIFICATION TO FOLLOW   Final   Report Status PENDING  Incomplete    OTHER STUDIES:  -  ANTIBIOTICS: Zosyn 1/10 vancomycing 1/10 Levaquin 1/13  SIGNIFICANT EVENTS: 1/13 >> increased FiO2 requirements to 12 LPM... Then BiPAP 40%. Transfer to 2M   Renee Pain, MD Board Certified by the ABIM, [place] Pager: [phone]  10/20/2017, 8:54 AM  Cc: 60 min

## 2017-10-20 NOTE — Progress Notes (Addendum)
Patient tachypnic this morning in 30's up to 55. On venturi mask 50%. HR in 110's sustaining. Labored breathing at times using accessary muscles. Called Dr. Danford Bad who ordered BIPAP, Rapid response, and Resp Therapist all aware. Called Critical care Dr. Carson Myrtle who will be coming shortly to assess patient. RT at bedside connecting BIPAP will continue to monitor.

## 2017-10-20 NOTE — Progress Notes (Signed)
Patient ID: DODD SCHMID, male   DOB: 08/08/1946, 72 y.o.   MRN: [id number]            [facility]  Date of Admission:  10/14/2017    Total days of antibiotics 7                 ASSESSMENT: There is still no unifying diagnosis for his abdominal pain elevated liver enzymes, hyperbilirubinemia thrombocytopenia, fever and pulmonary infiltrates.  It is unclear to me if his infiltrates represent aspiration pneumonia, healthcare associated pneumonia or acute lung injury.  Last night his antibiotics were broadened.  Recent sputum culture grew Candida tropicalis which represents an insignificant upper airway colonizer.  He does not need antifungal therapy.  PLAN: 1. Continue current antibiotics  Principal Problem:   Abdominal pain Active Problems:   Hepatitis   Cholestasis   Thrombocytopenia (HCC)   Pulmonary infiltrates   Fever   COPD (chronic obstructive pulmonary disease) (HCC)   Gastroparesis   Abnormal chest CT   Acute respiratory distress   Acute respiratory alkalosis   Mediastinal lymphadenopathy   Pleural effusion, bilateral   Abdominal distention   Enteropathogenic Escherichia coli infection   Mild malnutrition (HCC)   Scheduled Meds: . albuterol  2.5 mg Nebulization QID  . budesonide (PULMICORT) nebulizer solution  0.25 mg Nebulization BID  . chlorhexidine  15 mL Mouth Rinse BID  . gabapentin  300 mg Oral BID  . mouth rinse  15 mL Mouth Rinse q12n4p  . pantoprazole  40 mg Oral Daily  . sodium chloride flush  3 mL Intravenous Q12H   Continuous Infusions: . sodium chloride    . dexrose 5 % and 0.45 % NaCl with KCl 30 mEq/L 125 mL/hr at 10/20/17 0647  . levofloxacin (LEVAQUIN) IV Stopped (10/20/17 1502)  . piperacillin-tazobactam (ZOSYN)  IV 3.375 g (10/20/17 1503)  . vancomycin 750 mg (10/20/17 0836)   PRN Meds:.sodium chloride, acetaminophen, albuterol, chlorproMAZINE, morphine injection, ondansetron (ZOFRAN) IV, sodium chloride  flush   SUBJECTIVE: He developed hypoxia and increased work of breathing overnight and was transferred to the stepdown unit and placed on BiPAP.  He says he is having less abdominal pain.  He says he still has cough productive of thin pinkish sputum.  Review of Systems: Review of Systems  Constitutional: Negative for chills, diaphoresis and fever.  Respiratory: Positive for cough, sputum production and shortness of breath. Negative for hemoptysis.   Cardiovascular: Negative for chest pain.  Gastrointestinal: Positive for abdominal pain and constipation. Negative for diarrhea, nausea and vomiting.    No Known Allergies  OBJECTIVE: Vitals:   10/20/17 1300 10/20/17 1400 10/20/17 1601 10/20/17 1602  BP: 139/74 140/77  120/67  Pulse: (!) 107 (!) 106  (!) 108  Resp: (!) 33 (!) 27  (!) 31  Temp:      TempSrc:      SpO2: 97% 98% 95% 95%  Weight:      Height:       Body mass index is 26.93 kg/m.  Physical Exam  Constitutional: He is oriented to person, place, and time.  He is resting quietly in bed watching television with his BiPAP mask on.  Cardiovascular: Regular rhythm.  No murmur heard. He is tachycardic.  Pulmonary/Chest: Effort normal. He has no wheezes. He has no rales.  His lungs are clear today.  Abdominal: Soft. He exhibits distension. There is no tenderness.  Neurological: He is alert and oriented to person, place, and time.  Skin: No rash noted.  Psychiatric: Mood and affect normal.    Lab Results Lab Results  Component Value Date   WBC 12.1 (H) 10/20/2017   HGB 11.9 (L) 10/20/2017   HCT 33.4 (L) 10/20/2017   MCV 91.0 10/20/2017   PLT 172 10/20/2017    Lab Results  Component Value Date   CREATININE 0.87 10/20/2017   BUN 9 10/20/2017   NA 133 (L) 10/20/2017   K 4.2 10/20/2017   CL 106 10/20/2017   CO2 18 (L) 10/20/2017    Lab Results  Component Value Date   ALT 46 10/20/2017   AST 62 (H) 10/20/2017   ALKPHOS 131 (H) 10/20/2017   BILITOT 14.4 (H)  10/20/2017     Microbiology: Recent Results (from the past 240 hour(s))  Blood culture (routine x 2)     Status: None   Collection Time: 10/15/17  6:30 AM  Result Value Ref Range Status   Specimen Description BLOOD LEFT HAND  Final   Special Requests   Final    BOTTLES DRAWN AEROBIC AND ANAEROBIC Blood Culture results may not be optimal due to an inadequate volume of blood received in culture bottles   Culture NO GROWTH 5 DAYS  Final   Report Status 10/20/2017 FINAL  Final  Blood culture (routine x 2)     Status: None   Collection Time: 10/15/17  6:45 AM  Result Value Ref Range Status   Specimen Description BLOOD RIGHT HAND  Final   Special Requests   Final    BOTTLES DRAWN AEROBIC AND ANAEROBIC Blood Culture adequate volume   Culture NO GROWTH 5 DAYS  Final   Report Status 10/20/2017 FINAL  Final  Culture, blood (routine x 2)     Status: None (Preliminary result)   Collection Time: 10/17/17  4:25 AM  Result Value Ref Range Status   Specimen Description BLOOD LEFT ANTECUBITAL  Final   Special Requests   Final    BOTTLES DRAWN AEROBIC AND ANAEROBIC Blood Culture adequate volume   Culture NO GROWTH 3 DAYS  Final   Report Status PENDING  Incomplete  Culture, blood (routine x 2)     Status: None (Preliminary result)   Collection Time: 10/17/17  4:25 AM  Result Value Ref Range Status   Specimen Description BLOOD LEFT HAND  Final   Special Requests   Final    BOTTLES DRAWN AEROBIC ONLY Blood Culture adequate volume   Culture NO GROWTH 3 DAYS  Final   Report Status PENDING  Incomplete  Culture, expectorated sputum-assessment     Status: None   Collection Time: 10/17/17 10:50 AM  Result Value Ref Range Status   Specimen Description SPUTUM  Final   Special Requests NONE  Final   Sputum evaluation   Final    Sputum specimen not acceptable for testing.  Please recollect.   Gram Stain Report Called to,Read Back By and Verified With: A FOWLER,RN AT 6283 10/17/17 BY L BENFIELD     Report Status 10/17/2017 FINAL  Final  Gastrointestinal Panel by PCR , Stool     Status: Abnormal   Collection Time: 10/17/17  8:47 PM  Result Value Ref Range Status   Campylobacter species NOT DETECTED NOT DETECTED Final   Plesimonas shigelloides NOT DETECTED NOT DETECTED Final   Salmonella species NOT DETECTED NOT DETECTED Final   Yersinia enterocolitica NOT DETECTED NOT DETECTED Final   Vibrio species NOT DETECTED NOT DETECTED Final   Vibrio cholerae NOT DETECTED NOT DETECTED  Final   Enteroaggregative E coli (EAEC) NOT DETECTED NOT DETECTED Final   Enteropathogenic E coli (EPEC) DETECTED (A) NOT DETECTED Final    Comment: RESULT CALLED TO, READ BACK BY AND VERIFIED WITH: [facility] JONES 10/18/17 @ 1813 MLK    Enterotoxigenic E coli (ETEC) NOT DETECTED NOT DETECTED Final   Shiga like toxin producing E coli (STEC) NOT DETECTED NOT DETECTED Final   Shigella/Enteroinvasive E coli (EIEC) NOT DETECTED NOT DETECTED Final   Cryptosporidium NOT DETECTED NOT DETECTED Final   Cyclospora cayetanensis NOT DETECTED NOT DETECTED Final   Entamoeba histolytica NOT DETECTED NOT DETECTED Final   Giardia lamblia NOT DETECTED NOT DETECTED Final   Adenovirus F40/41 NOT DETECTED NOT DETECTED Final   Astrovirus NOT DETECTED NOT DETECTED Final   Norovirus GI/GII NOT DETECTED NOT DETECTED Final   Rotavirus A NOT DETECTED NOT DETECTED Final   Sapovirus (I, II, IV, and V) NOT DETECTED NOT DETECTED Final    Comment: Performed at [facility], [address] [place], [place] [address]  Respiratory Panel by PCR     Status: None   Collection Time: 10/17/17  8:47 PM  Result Value Ref Range Status   Adenovirus NOT DETECTED NOT DETECTED Final   Coronavirus 229E NOT DETECTED NOT DETECTED Final   Coronavirus HKU1 NOT DETECTED NOT DETECTED Final   Coronavirus NL63 NOT DETECTED NOT DETECTED Final   Coronavirus OC43 NOT DETECTED NOT DETECTED Final   Metapneumovirus NOT DETECTED NOT DETECTED Final   Rhinovirus  / Enterovirus NOT DETECTED NOT DETECTED Final   Influenza A NOT DETECTED NOT DETECTED Final   Influenza B NOT DETECTED NOT DETECTED Final   Parainfluenza Virus 1 NOT DETECTED NOT DETECTED Final   Parainfluenza Virus 2 NOT DETECTED NOT DETECTED Final   Parainfluenza Virus 3 NOT DETECTED NOT DETECTED Final   Parainfluenza Virus 4 NOT DETECTED NOT DETECTED Final   Respiratory Syncytial Virus NOT DETECTED NOT DETECTED Final   Bordetella pertussis NOT DETECTED NOT DETECTED Final   Chlamydophila pneumoniae NOT DETECTED NOT DETECTED Final   Mycoplasma pneumoniae NOT DETECTED NOT DETECTED Final  Culture, expectorated sputum-assessment     Status: None   Collection Time: 10/17/17  9:45 PM  Result Value Ref Range Status   Specimen Description EXPECTORATED SPUTUM  Final   Special Requests NONE  Final   Sputum evaluation THIS SPECIMEN IS ACCEPTABLE FOR SPUTUM CULTURE  Final   Report Status 10/18/2017 FINAL  Final  Acid Fast Smear (AFB)     Status: None   Collection Time: 10/17/17  9:45 PM  Result Value Ref Range Status   AFB Specimen Processing Concentration  Final   Acid Fast Smear Negative  Final    Comment: (NOTE) Performed At: [facility] [place], [place] [id number] Rush Farmer MD CW:[id number]    Source (AFB) EXPECTORATED SPUTUM  Final  Culture, respiratory (NON-Expectorated)     Status: None   Collection Time: 10/17/17  9:45 PM  Result Value Ref Range Status   Specimen Description EXPECTORATED SPUTUM  Final   Special Requests NONE Reflexed from [id number]  Final   Gram Stain   Final    MODERATE WBC PRESENT, PREDOMINANTLY MONONUCLEAR FEW SQUAMOUS EPITHELIAL CELLS PRESENT FEW BUDDING YEAST SEEN RARE GRAM POSITIVE COCCI IN PAIRS    Culture FEW CANDIDA TROPICALIS  Final   Report Status 10/20/2017 FINAL  Final  Culture, blood (Routine X 2) w Reflex to ID Panel     Status: None (Preliminary result)  Collection Time: 10/19/17  7:27 PM  Result Value Ref Range  Status   Specimen Description BLOOD LEFT ANTECUBITAL  Final   Special Requests   Final    BOTTLES DRAWN AEROBIC AND ANAEROBIC Blood Culture adequate volume   Culture NO GROWTH < 24 HOURS  Final   Report Status PENDING  Incomplete  Culture, blood (Routine X 2) w Reflex to ID Panel     Status: None (Preliminary result)   Collection Time: 10/19/17  7:29 PM  Result Value Ref Range Status   Specimen Description BLOOD BLOOD RIGHT HAND  Final   Special Requests IN PEDIATRIC BOTTLE Blood Culture adequate volume  Final   Culture NO GROWTH < 24 HOURS  Final   Report Status PENDING  Incomplete    Michel Bickers, MD [facility] [phone] pager   [phone] cell 10/20/2017, 4:11 PM

## 2017-10-20 NOTE — Progress Notes (Signed)
Physical Therapy Discharge Patient Details Name: [name] MRN: [id number] DOB: 23-Jul-1946 Today's Date: 10/20/2017 Time:  1100-     Patient discharged from PT services secondary to medical decline - will need to re-order PT to resume therapy services.  Please see latest therapy progress note for current level of functioning and progress toward goals.    Progress and discharge plan discussed with patient and/or caregiver: Patient/Caregiver agrees with plan  GP     [place] 10/20/2017, 11:00 AM

## 2017-10-20 NOTE — Progress Notes (Signed)
   Subjective: Patient was seen and evaluated at bedside this morning. He was in moderate respiratory distress on venturi mask and could not speak more than short 1 word phrases.  Unfortunately, patients symptoms worsened yesterday after rounds. Team paged around 5pm that his O2 requirement had increased to 6L and appeared distressed. He looked ill on examination and placed on venturi mask/12L/FiO2 50%. CXR stable diffuse interstitial changes throughout. He was also febrile to 100.9, repeat blood cultures were obtained and antibiotics were re-broadened to Vancomycin and Zosyn. Throughout the night he remained on venturi mask and remains distressed this morning. I've ordered BiPAP and have re-consulted pulmonology and we appreciate their expertise.  Objective: Vital signs in last 24 hours: Vitals:   10/20/17 0600 10/20/17 0632 10/20/17 0735 10/20/17 0737  BP: (!) 141/70     Pulse: (!) 109     Resp: (!) 31     Temp:  (!) 97.2 F (36.2 C)    TempSrc:  Oral    SpO2: 95%  96% 91%  Weight:      Height:       General: Very-ill appearing male laying in bed with HOB elevated. On venturi mask with labored respirations. Frankly jaundiced.  Pulm: Coarse rhonchi. Labored breathing. Able to say 1 word phrases only CV: Tachycardic but regular Abdomen: Diffusely tender throughout. Firm. Extremities: Warm and perfused extremities. No skin mottling.   Assessment/Plan: [name] is a 72 year old male admitted 10/14/17 with acute onset abdominal pain and nausea/vomiting.  Workup revealed cholestatic liver injury, increased stool burden, and bilateral upper lobe multifocal pneumonia. His hospital course has been complicated by continually worsening bilirubin, persistent abdominal pain and worsening respiratory status. He is currently on BiPAP and suspect transfer to ICU.  Sepsis 2/2 Multifocal PNA His respiratory status is frankly worse today. He was maintained on venturi mask with 12 L/50% FiO2 overnight  however remains distressed. Have ordered BiPAP and re-consulted PCCM. He was febrile yesterday and antibiotics were re-broadened to Vancomycin and Zosyn. Repeat blood cultures were also obtained. Pressures presently are stable. -BiPAP -Greatly appreciate pulm and ID consultations -Expect patient will be transferred to ICU -Continue Vancomycin and Zosyn for now  Abdominal Pain His abdominal pain persists. KUB yesterday with ileus.  -EGD 1/8 with retained food c/w gastroparesis -Appreciate GIs recommendations  Hyperbilirubinemia His bilirubin continues to rise daily with direct > indirect. This suggests a post-hepatic process. His Alk Phos has been rising the past several days and is back to being elevated at 131. His AST and ALT are rising also. CT did show mild intrahepatic biliary dilatation. RUQ US showed gallbladder contraction without visible stones or wall thickening. There was no biliary duct dilatation. Suspect patient might need advanced imaging like MRCP at some point.  -Antimitochondrial antibodies pending  Normocytic Anemia and Thrombocytopenia  Thrombocytopenia has resolved. Hb about stable from yesterday. Does have pink tinged sputum which could contribute to his normocytic anemia.  Sinus Tachycardia Reviewed patients telemetry. He remained tachycardic throughout the night.    Chronic Lumbar Back Pain  Continuing Gabapentin  - Holding Meloxicam, Norco, and Tizanidine   Migraine Headaches  -Patient uses excessive amounts of NSAIDs for both his migraines and chronic back pain. Could consider prophylaxis with BB at some point  COPD - Continue Spiriva with albuterol PRN  Dispo: Anticipated discharge in approximately 1 week.   Brad Sermeno, DO 10/20/2017, 7:58 AM My Pager: [phone]

## 2017-10-20 NOTE — Progress Notes (Signed)
Progress Note   Subjective  Chief Complaint:Abdominal pain, Abnormal LFTs  Pt moved to 73M this morning after increased respiratory distress yesterday afternoon. Patient currently on BiPap and feeling better per his report. Patient also had fever 100.9 yesterday, repeat blood cultures were done yesterday and abx coverage re-broadened with Vancomycin and Zosyn.  NO new GI complaints this morning.    Objective   Vital signs in last 24 hours: Temp:  [97.2 F (36.2 C)-100.9 F (38.3 C)] 97.9 F (36.6 C) (01/13 0844) Pulse Rate:  [104-122] 104 (01/13 1000) Resp:  [22-37] 25 (01/13 1000) BP: (126-141)/(64-85) 134/73 (01/13 1000) SpO2:  [85 %-97 %] 97 % (01/13 1000) FiO2 (%):  [50 %] 50 % (01/13 0735) Last BM Date: 10/18/17 General:   Ill-appearing Elderly Caucasian male in NAD, on BiPap HEENT: scleral icterus Heart:  Regular rate and rhythm; no murmurs Lungs: Respirations even and unlabored, lungs CTA bilaterally Abdomen:  Soft, superficial abdominal wall tenderness and moderate distension. Normal bowel sounds. Extremities:  Without edema. Neurologic:  Alert and oriented,  grossly normal neurologically. Psych:  Cooperative. Normal mood and affect.  Intake/Output from previous day: 01/12 0701 - 01/13 0700 In: 2270.8 [P.O.:600; I.V.:1420.8; IV Piggyback:250] Out: 800 [Urine:800] Intake/Output this shift: Total I/O In: -  Out: 400 [Urine:400]  Lab Results: Recent Labs    10/18/17 0207 10/19/17 0531 10/20/17 0310  WBC 8.1 10.5 12.1*  HGB 12.4* 12.6* 11.9*  HCT 35.4* 35.5* 33.4*  PLT 107* 130* 172   BMET Recent Labs    10/18/17 0207 10/19/17 0531 10/20/17 0310  NA 135 135 133*  K 3.9 4.7 4.2  CL 109 107 106  CO2 20* 20* 18*  GLUCOSE 129* 119* 124*  BUN '6 6 9  '$ CREATININE 0.99 0.93 0.87  CALCIUM 7.8* 8.2* 8.0*   Hepatic Function Latest Ref Rng & Units 10/20/2017 10/19/2017 10/19/2017  Total Protein 6.5 - 8.1 g/dL 6.0(L) - 6.1(L)  Albumin 3.5 - 5.0 g/dL 1.9(L) -  2.1(L)  AST 15 - 41 U/L 62(H) - 53(H)  ALT 17 - 63 U/L 46 - 49  Alk Phosphatase 38 - 126 U/L 131(H) - 122  Total Bilirubin 0.3 - 1.2 mg/dL 14.4(H) 12.2(H) 11.2(H)  Bilirubin, Direct 0.1 - 0.5 mg/dL 9.3(H) 7.9(H) -   Studies/Results: Dg Chest 2 View  Result Date: 10/19/2017 CLINICAL DATA:  Shortness of Breath EXAM: CHEST  2 VIEW COMPARISON:  10/17/2017 FINDINGS: Diffuse interstitial prominence throughout the lungs, with peribronchial thickening. Previously seen areas of consolidation in the upper lobes has improved. Heart is normal size. No effusions. IMPRESSION: Severe diffuse interstitial prominence throughout the lungs. The confluent ground-glass opacities in the upper lobes have improved. Electronically Signed   By: Rolm Baptise M.D.   On: 10/19/2017 12:46   Dg Chest Port 1 View  Result Date: 10/19/2017 CLINICAL DATA:  Shortness of breath and chest pain EXAM: PORTABLE CHEST 1 VIEW COMPARISON:  10/19/2017 FINDINGS: Cardiac shadow is stable. Aortic calcifications are noted. Diffuse interstitial prominence is again identified throughout both lungs stable in appearance from the prior exam. No focal infiltrate or sizable effusion is seen. No bony abnormality is noted. IMPRESSION: Stable interstitial changes bilaterally. Electronically Signed   By: Inez Catalina M.D.   On: 10/19/2017 18:56   Dg Abd 2 Views  Result Date: 10/19/2017 CLINICAL DATA:  Abdominal pain EXAM: ABDOMEN - 2 VIEW COMPARISON:  10/16/2017 FINDINGS: Gas within nondistended large bowel. Mild gaseous distention of bowel loops in the abdomen. No organomegaly  or free air. No suspicious calcification. IMPRESSION: Mild gaseous distention of bowel loops in the abdomen, suspect mild ileus. Electronically Signed   By: Rolm Baptise M.D.   On: 10/19/2017 12:47       Assessment / Plan:   Assessment: 1. Abdominal pain: ?neuropathic, tests pending to r/o porphyria 2. Elevated Bilirubin: suspected from intrahepatic cholestasis from infection  and/or drugs, transaminases continue to improve, no evidence for biliary obstruction, liver disease or gallbladder disease 3. Gastroparesis 4. Pneumonia: suspected aspiration, now with increased respiratory distress overnight, now on BiPap 5. Afib with RVR  Plan: 1. Porphobilinogen pending, collected 1/11/9 2. Continue other supportive measures 3. Please await any final recommendations from Dr. Henrene Pastor later today Thank you for your kind consultation, we will continue to follow.   LOS: 6 days  Levin Erp  10/20/2017, 10:27 AM Pager # [phone]  GI ATTENDING  Interval history data reviewed. Patient personally seen and examined. Agree with interval comprehensive progress note. Friend at bedside. Breathing better on BiPAP. Abdomen benign first time since I have been seeing him. Primary service treating pneumonia and atrial fibrillation. Elevated bilirubin and alkaline phosphatase felt to be intrahepatic cholestasis, probably drug related. At this point we need to consider nutrition. With gastroparesis I suspect he would be best served with TNA. Recommend consulting nutritional service/pharmacy for TNA. Testing for acute porphyria still pending. Dr. Silverio Decamp on inpatient GI service starting tomorrow. Discussed with patient  Docia Chuck. Geri [place]in[place]., M.D. [facility] Division of Gastroenterology

## 2017-10-20 NOTE — Progress Notes (Signed)
  Date: 10/20/2017  Patient name: JOSTEN WARMUTH  Medical record number: [id number]  Date of birth: 21-Aug-1946   I have seen and evaluated this patient and I have discussed the plan of care with the house staff. Please see their note for complete details. I concur with their findings with the following additions/corrections:   Overnight, Mr. Luginbill developed worsening hypoxia and tachypnea, initially requiring Venturi mask at 50% and then eventually BiPAP. We consulted critical care this morning, and he was transferred to the ICU. I checked on him this afternoon, and he reported improvement in his breathing while on BiPAP, although he was still tachypneic to the 30s.  His clinical picture of abdominal pain, vomiting, cholestasis, jaundice, thrombocytopenia, and widespread interstitial pneumonitis with hypoxic respiratory failure is still perplexing and does not seem to fit nicely with a single unifying diagnosis. Although aspiration pneumonia could possibly explain his lung findings, the distribution is atypical (upper lobe predominance) and he initially reported that his cough preceded his abdominal pain and vomiting. Cholangitis was initially high on our differential, but we have not visualized any gallstones or dilated extrahepatic bile duct and he has continued to have fevers and deteriorate clinically despite improvement in his AST and ALT. It is unclear why his bilirubin continues to rise, up to 14.4 today. On more recent imaging, he has had intrahepatic ductal dilation, so PSC and cholangiocarcinoma are considerations.  Although infection seems the most likely etiology, his ongoing fevers despite broad antibiotics warrant consideration of inflammatory etiologies. Microscopic polyangiitis could have a similar appearance of the lungs, but would be more commonly associated with renal abnormalities then gastrointestinal. His climbing LDH and elevated ferritin also raise a concern of malignancy,  although he has had CT imaging of his chest, abdomen, and pelvis without any identified malignancy. Sputum cytology is pending, and I continue to wonder about the utility of bronchoscopy if he is clinically stable enough. In addition, it seems to me that MRCP could be useful to evaluate his progressive hyperbilirubinemia, again based on his clinical stability. There are number of autoimmune tests as well as urine screen for porphyria that are still pending, and may still reveal the cause of his illness.  Oda Kilts, MD 10/20/2017, 8:30 PM

## 2017-10-20 NOTE — Progress Notes (Signed)
Patient transferred from 5w. Bipap on Patient. Pt resting comfortably.   Lucca Greggs, Mervin Kung RN

## 2017-10-20 NOTE — Plan of Care (Signed)
Discussed with the patient plan of care for the evening, pain management and need for the bipap machine with some teach back displayed.

## 2017-10-21 ENCOUNTER — Inpatient Hospital Stay (HOSPITAL_COMMUNITY): Payer: Medicare Other

## 2017-10-21 DIAGNOSIS — R112 Nausea with vomiting, unspecified: Secondary | ICD-10-CM

## 2017-10-21 DIAGNOSIS — K831 Obstruction of bile duct: Secondary | ICD-10-CM

## 2017-10-21 DIAGNOSIS — R945 Abnormal results of liver function studies: Secondary | ICD-10-CM

## 2017-10-21 DIAGNOSIS — E46 Unspecified protein-calorie malnutrition: Secondary | ICD-10-CM

## 2017-10-21 LAB — CBC
HCT: 30.2 % — ABNORMAL LOW (ref 39.0–52.0)
Hemoglobin: 10.6 g/dL — ABNORMAL LOW (ref 13.0–17.0)
MCH: 31.9 pg (ref 26.0–34.0)
MCHC: 35.1 g/dL (ref 30.0–36.0)
MCV: 91 fL (ref 78.0–100.0)
Platelets: 228 10*3/uL (ref 150–400)
RBC: 3.32 MIL/uL — ABNORMAL LOW (ref 4.22–5.81)
RDW: 13.5 % (ref 11.5–15.5)
WBC: 14.7 10*3/uL — ABNORMAL HIGH (ref 4.0–10.5)

## 2017-10-21 LAB — COMPREHENSIVE METABOLIC PANEL
ALT: 52 U/L (ref 17–63)
AST: 92 U/L — ABNORMAL HIGH (ref 15–41)
Albumin: 1.8 g/dL — ABNORMAL LOW (ref 3.5–5.0)
Alkaline Phosphatase: 209 U/L — ABNORMAL HIGH (ref 38–126)
Anion gap: 10 (ref 5–15)
BUN: 11 mg/dL (ref 6–20)
CO2: 17 mmol/L — ABNORMAL LOW (ref 22–32)
Calcium: 7.5 mg/dL — ABNORMAL LOW (ref 8.9–10.3)
Chloride: 103 mmol/L (ref 101–111)
Creatinine, Ser: 0.85 mg/dL (ref 0.61–1.24)
GFR calc Af Amer: >60 mL/min (ref >60)
GFR calc non Af Amer: >60 mL/min (ref >60)
Glucose, Bld: 126 mg/dL — ABNORMAL HIGH (ref 65–99)
Potassium: 3.9 mmol/L (ref 3.5–5.1)
Sodium: 130 mmol/L — ABNORMAL LOW (ref 135–145)
Total Bilirubin: 19.4 mg/dL (ref 0.3–1.2)
Total Protein: 5.8 g/dL — ABNORMAL LOW (ref 6.5–8.1)

## 2017-10-21 LAB — BILIRUBIN, FRACTIONATED(TOT/DIR/INDIR)
Bilirubin, Direct: 13.8 mg/dL — ABNORMAL HIGH (ref 0.1–0.5)
Indirect Bilirubin: 5.6 mg/dL — ABNORMAL HIGH (ref 0.3–0.9)
Total Bilirubin: 19.4 mg/dL (ref 0.3–1.2)

## 2017-10-21 LAB — MRSA PCR SCREENING: MRSA by PCR: NEGATIVE

## 2017-10-21 LAB — EPSTEIN-BARR VIRUS VCA ANTIBODY PANEL
EBV Early Antigen Ab, IgG: <9 U/mL (ref 0.0–8.9)
EBV NA IgG: 355 U/mL — ABNORMAL HIGH (ref 0.0–17.9)
EBV VCA IgG: 373 U/mL — ABNORMAL HIGH (ref 0.0–17.9)
EBV VCA IgM: <36 U/mL (ref 0.0–35.9)

## 2017-10-21 LAB — ANA W/REFLEX IF POSITIVE: Anti Nuclear Antibody(ANA): NEGATIVE

## 2017-10-21 MED ORDER — POTASSIUM CHLORIDE 2 MEQ/ML IV SOLN
INTRAVENOUS | Status: AC
  Administered 2017-10-21 – 2017-10-22 (×3): via INTRAVENOUS
  Filled 2017-10-21 (×5): qty 1000

## 2017-10-21 MED ORDER — GLUCERNA SHAKE PO LIQD: 237.0000 mL | Freq: Three times a day (TID) | ORAL | Status: DC

## 2017-10-21 MED ORDER — LORAZEPAM 2 MG/ML IJ SOLN: 1.0000 mg | Freq: Four times a day (QID) | INTRAMUSCULAR | Status: DC | PRN

## 2017-10-21 NOTE — Progress Notes (Signed)
PHARMACY - ADULT TOTAL PARENTERAL NUTRITION CONSULT NOTE   Pharmacy Consult:  TPN Indication:  Possible ileus  Patient Measurements: Height: _0  (170.2 cm) Weight: 171 lb 15.3 oz (78 kg) IBW/kg (Calculated) : 66.1 TPN AdjBW (KG): 76.9 Body mass index is 26.93 kg/m.  Assessment:  [name] presented on 10/14/17 with abdominal pain.  10/15/17 UGI endoscopy revealed gastroparesis that did not explain transaminitis and hyperbilirubinemia.  Patient has been on a liquid diet, but his intake has been minimal to none since admission.  Per documentation, abdominal pain is worse when he drinks more than a few sips; therefore, Pharmacy consulted to manage TPN for nutritional support.  Spoke to GI, MD concerned that patient will not tolerate post-pyloric feeding and there may be a component of an ileus.  Patient was eating regularly before admission.  GI: GERD on PPI PO.  Endo: no hx DM - AM glucose controlled Insulin requirements in the past 24 hours: N/A Lytes: all WNL except low Na and CO2 Renal: SCr 0.85, BUN WNL - D51/2NS 30K at 125 ml/hr Pulm: COPD / tobacco - HFNC - albuterol, Pulmicort Cards: HTN / HLD, new AFib 1/10 (CHADsVASc = 2) - BP controlled, tachy Hepatobil: suspects intrahepatic cholestasis - alk phos up 209, AST up 92, tbili 19.4 Neuro: gabapentin ID: Abx D#7 - LVQ/Zosyn/Vanc for PNA - afebrile, WBC WNL TPN Access: PICC to be ordered by GI TPN start date: 10/22/17  Nutritional Goals (RD rec pending): 1900-2000 kCal and 90-115gm protein per day  Current Nutrition:  Full liquid diet Glucerna TID (none given)   Plan:  Start TPN 10/22/17 as it is now after hour TPN labs and nursing care orders Change IVF from D51/2NS 30K to D5NS 30K, continue at 125 ml/hr F/U anticoagulation for new-onset Afib   Tally Mckinnon D. Mina Marble, PharmD, BCPS Pager:  [phone] 10/21/2017, 2:24 PM

## 2017-10-21 NOTE — Progress Notes (Addendum)
MD paged in regards to IV team consult - MD advised order to insert PICC needed instead of consult, MD advised order to be entered.     CRITICAL VALUE ALERT  Critical Value:  Total Bilirubin 19.4  Date & Time Notied:  4373 10/21/17  Provider Notified: Genia Del, MD  Orders Received/Actions taken: will await recommendations from MD

## 2017-10-21 NOTE — Progress Notes (Signed)
   Subjective: Patient able to come off BiPAP today. States that he feels his breathing is slightly improved. He has not eaten anything but is thirsty. Feels that his mouth is dry. He has note had a bowel movement in several days. Voices frustrations that he is not getting better. We discussed that we will continue antibiotics and discuss the case with our consultants. We also discussed transferring to another facility to pursue further work-up. He voices understanding. All questions and concerns addressed.   Objective: Vital signs in last 24 hours: Vitals:   10/21/17 0900 10/21/17 1000 10/21/17 1132 10/21/17 1156  BP: 123/67 122/71  126/66  Pulse: (!) 104 (!) 110  (!) 108  Resp: (!) 22 (!) 29  (!) 23  Temp:    98.5 F (36.9 C)  TempSrc:    Oral  SpO2: 95% 94% 93% 95%  Weight:      Height:       General: Well nourished male, jaundice in appearance  Pulm: Tachypnea, diminished breath sounds, no crackles appreciated CV: Tachycardia, no murmurs, no rubs  Abdomen: Absent bowel sounds, distended, diffuse tenderness to palpation  Extremities: No LE edema  Skin: Jaundice, no rashes noted   Assessment/Plan:  [name] is a 72 year old male admitted 10/14/17 with acute onset abdominal pain and nausea/vomiting.  Workup revealed cholestatic liver injury, increased stool burden, and bilateral upper lobe multifocal pneumonia. His hospital course has been complicated by continually worsening bilirubin, persistent abdominal pain and worsening respiratory status. He is currently on heated high flow nasal cannula and intermittent BiPAP   Sepsis 2/2 Multifocal PNA He is now off BiPAP but continues to require HFNC 10L/min to maintain oxygen saturations >90%. He is otherwise currently hemodynamically stable. ABG illustrating elevated A-a gradient likely from underlying pneumonia. Blood cultures with no growth to date. Sputum cultures did grow Candida Tropicalis; however, this is felt to be a colonizer.    - Elevated IgE (no apparent risk factors for parasitic disease) - ANA negative  - Continuing Levaquin, Vanc, and Zosyn  - Appreciate IDs recommendations.    Abdominal Pain  His abdominal pain persists. KUB on 01/12 with ileus. Patient has not had a bowel movement in past 4-5 days.  - EGD 1/8 with retained food c/w gastroparesis - Avoid opiates if possible  - Maintain potassium >4.0  - Appreciate GIs recommendations; repeat abdominal films and plan to initiate TPN - CMP pending    Hyperbilirubinemia His bilirubin continues to rise daily with direct > indirect. This suggests a post-hepatic process. CT did show mild intrahepatic biliary dilatation. RUQ US showed gallbladder contraction without visible stones or wall thickening. There was no biliary duct dilatation. CMP/Bilirubin pending today.  - Antimitochondrial antibodies pending - Appreciate GI recommendation  Normocytic Anemia and Thrombocytopenia  - Thrombocytopenia resolved  - CBC pending    Sinus Tachycardia Reviewed patient's telemetry. He remained tachycardic throughout the night.     Chronic Lumbar Back Pain  Continuing Gabapentin  - Holding Meloxicam, Norco, and Tizanidine    Migraine Headaches  - Patient uses excessive amounts of NSAIDs for both his migraines and chronic back pain. Could consider prophylaxis with BB at some point   COPD - Continue Spiriva with albuterol PRN  Dispo: Anticipated discharge pending clinical improvement vs transfer to another facility.   Brad Homes, MD 10/21/2017, 1:17 PM My Pager: [phone]

## 2017-10-21 NOTE — Progress Notes (Addendum)
[facility] Progress Note  Patient Name: [name] MRN: [id number] DOB: [birth year]    ADMISSION DATE:  10/14/2017 CONSULTATION DATE:  10/17/2017  REFERRING MD:  Dr. Ovid Curd [place]/Dr. Lenice Pressman  REASON FOR CONSULTATION:  Abnormal chest CT/bilateral ground glass opacities   ASSESSMENT/PLAN:  ASSESSMENT (included in the Hospital Problem List)  Principal Problem:   Hepatic failure [facility]) Active Problems:   Abdominal pain   Abnormal chest CT   Pulmonary infiltrates   Acute respiratory distress   Acute respiratory alkalosis   Enteropathogenic Escherichia coli infection   Cholestasis   Mediastinal lymphadenopathy   Pleural effusion, bilateral   COPD (chronic obstructive pulmonary disease) (HCC)   Gastroparesis   Thrombocytopenia (HCC)   Fever   Abdominal distention   Mild malnutrition (HCC)    PLAN/RECOMMENDATIONS  Check Aspergillus-specific precipitins, anti-smooth muscle IgG Check procalcitonin On Zosyn/levofloxacin/vancomycin BiPAP PRN Ativan 1 mg IV q 6 PRN while on BiPAP ONLY CXR in am. Continue albuterol. Start budesonide. Hold Spiriva. I have confirmed the patient's CODE STATUS. While the current plan is to try BiPAP support. He is agreeable to endotracheal intubation and mechanical ventilatory support as needed in the event of further deterioration.     SUBJECTIVE:   -Interim events: transferred to stepdown (84M) in the interim. Did use BiPAP last night. The patient reports that he does get anxious while on BiPAP 12/6 but also recognizes that it helps him breathe. ABG continues to show respiratory alkalosis. On empiric Zosyn/Levaquin/vancomycin for empiric coverage of aspiration pneumonia with patient report of multilpe aspiration events prior to admission. No compelling culture data to guide antimicrobial treatment. Curiously, the patient's serum total IgE was 678. He endorses cough with sputum  production. Stool was positive for enteropathogenic E coli. WBC still rising. T-bili still rising.  HISTORY OF PRESENT ILLNESS This 72 y.o. Caucasian male smoker was originally seen on 10/17/2017 in consultation at the request of Dr. Lenice Pressman for recommendations on further evaluation and management of an abnormal chest CT. He initially presented on 10/14/2017 for EGD to further evaluate complaints of abdominal pain, vomiting and pinkish emesis that started the night before presentation, with a recent history in increased NSAID use (Goody powder, meloxicam, aspirin) consumption and elevated LFTs. He had multiple bouts of emesis. The patient reports that he is certain that he also had a gross aspiration event.  REVIEW OF SYSTEMS Constitutional: No weight loss. No night sweats. No fever. No chills. No fatigue. HEENT: No headaches, dysphagia, sore throat, otalgia, nasal congestion, PND CV:  No chest pain, orthopnea, PND, swelling in lower extremities, palpitations GI:  Abdominal pain (diffuse). Nausea with multiple bouts of emesis. No diarrhea, change in bowel pattern, anorexia. Resp: No DOE at baseline but now experiencing mild exertional dyspnea. At baseline, claims to be able to walk 5 miles. No cough. No sputum production. No hemoptysis. Uses "inhalers" for COPD. GU: no dysuria, change in color of urine, no urgency or frequency.  No flank pain. MS:  No joint pain or swelling. No myalgias,  No decreased range of motion.  Psych:  No change in mood or affect. No memory loss. Skin: Jaundice  PAST MEDICAL/SURGICAL HISTORY  Past Medical History:  Diagnosis Date  . Anxiety and depression   . COPD (chronic obstructive pulmonary disease) ([place])   . GERD (gastroesophageal reflux disease)   . HTN (hypertension)   . Hyperlipemia   . Migraine   . Personal history of alcoholism ([place])   .  Tobacco use   . Tubular adenoma 01/29/2008    Past Surgical History:  Procedure Laterality Date  . ANTERIOR  FUSION CERVICAL SPINE  2005   two surgeries after two mechanical falls, per Dr. Donald Pore  . COLONOSCOPY W/ BIOPSIES  01/29/2008   Dr. Silvano Rusk  . ESOPHAGOGASTRODUODENOSCOPY N/A 10/15/2017   Procedure: ESOPHAGOGASTRODUODENOSCOPY (EGD);  Surgeon: Irene Shipper, MD;  Location: [facility] END[facility]COPY;  Service: Endoscopy;  Laterality: N/A;  . [place]   correction for crossed-eyes  . [place]    Prior to Admission medications   Medication Sig Start Date End Date Taking? Authorizing Provider  acetaminophen (TYLENOL) 325 MG tablet Take 2 tablets (650 mg total) by mouth every 6 (six) hours as needed for mild pain or moderate pain. 06/25/17  Yes Waynetta Pean, PA-C  alendronate (FOSAMAX) 70 MG tablet Take 1 tablet (70 mg total) by mouth every 7 (seven) days. Take with a full glass of water on an empty stomach. 06/27/17 06/27/18 Yes Alphonzo Grieve, MD  aspirin EC 81 MG tablet Take 1 tablet (81 mg total) by mouth daily. 04/12/17 04/12/18 Yes Lorella Nimrod, MD  atorvastatin (LIPITOR) 20 MG tablet Take 1 tablet (20 mg total) by mouth daily. 10/03/17  Yes Lorella Nimrod, MD  Diclofenac Sodium 3 % GEL Place 1 application onto the skin 2 (two) times daily. To affected area. 06/25/17  Yes Waynetta Pean, PA-C  furosemide (LASIX) 20 MG tablet Take 1 tablet (20 mg total) by mouth daily. For leg swelling. 08/02/17 08/02/18 Yes Lorella Nimrod, MD  gabapentin (NEURONTIN) 300 MG capsule Take 1 capsule (300 mg total) by mouth 2 (two) times daily. 07/12/17  Yes Lorella Nimrod, MD  HYDROcodone-acetaminophen (NORCO) 5-325 MG tablet Take 1 tablet by mouth every 6 (six) hours as needed for moderate pain. 09/04/17  Yes Rice, Resa Miner, MD  meloxicam (MOBIC) 7.5 MG tablet Take 7.5 mg by mouth daily.   Yes [provider]  Multiple Vitamins-Minerals (CENTRUM SILVER ADULT 50+) TABS Take 1 tablet by mouth daily.   Yes [provider]  PROAIR HFA 108 (90 Base) MCG/ACT inhaler INHALE 2 PUFFS INTO THE  LUNGS FOUR TIMES DAILY Patient taking differently: INHALE 2 PUFFS INTO THE LUNGS FOUR TIMES DAILY AS NEEDED FOR SOB. 07/22/17  Yes Lorella Nimrod, MD  tiotropium (SPIRIVA) 18 MCG inhalation capsule Place 1 capsule (18 mcg total) into inhaler and inhale daily. 02/20/17  Yes Lorella Nimrod, MD  tiZANidine (ZANAFLEX) 2 MG tablet Take 2 mg by mouth every 6 (six) hours as needed for muscle spasms.   Yes [provider]    No Known Allergies   Current Facility-Administered Medications:  .  0.9 %  sodium chloride infusion, 250 mL, Intravenous, PRN, Maryellen Pile, MD .  acetaminophen (TYLENOL) tablet 650 mg, 650 mg, Oral, Q6H PRN, Colbert Ewing, MD, 650 mg at 10/20/17 2121 .  albuterol (PROVENTIL) (2.5 MG/3ML) 0.083% nebulizer solution 2.5 mg, 2.5 mg, Nebulization, QID, Oda Kilts, MD, 2.5 mg at 10/21/17 1131 .  albuterol (PROVENTIL) (2.5 MG/3ML) 0.083% nebulizer solution 3 mL, 3 mL, Inhalation, Q4H PRN, Molt, Bethany, DO .  budesonide (PULMICORT) nebulizer solution 0.25 mg, 0.25 mg, Nebulization, BID, Renee Pain, MD, 0.25 mg at 10/21/17 0820 .  chlorhexidine (PERIDEX) 0.12 % solution 15 mL, 15 mL, Mouth Rinse, BID, Oda Kilts, MD, 15 mL at 10/21/17 1148 .  chlorproMAZINE (THORAZINE) tablet 25 mg, 25 mg, Oral, TID PRN, Ina Homes, MD, 25 mg at 10/16/17  1831 .  dextrose 5 % and 0.45 % NaCl with KCl 30 mEq/L infusion, , Intravenous, Continuous, Vena Rua, PA-C, Last Rate: 125 mL/hr at 10/21/17 1258 .  feeding supplement (GLUCERNA SHAKE) (GLUCERNA SHAKE) liquid 237 mL, 237 mL, Oral, TID BM, Gribbin, Sarah J, PA-C .  gabapentin (NEURONTIN) capsule 300 mg, 300 mg, Oral, BID, Maryellen Pile, MD, 300 mg at 10/21/17 1148 .  levofloxacin (LEVAQUIN) IVPB 750 mg, 750 mg, Intravenous, Q24H, Renee Pain, MD, Last Rate: 100 mL/hr at 10/21/17 1148, 750 mg at 10/21/17 1148 .  MEDLINE mouth rinse, 15 mL, Mouth Rinse, q12n4p, Oda Kilts, MD, 15 mL at 10/20/17  1301 .  morphine 4 MG/ML injection 1 mg, 1 mg, Intravenous, Q4H PRN, Hammons, Kimberly B, RPH .  ondansetron (ZOFRAN) injection 4 mg, 4 mg, Intravenous, Q8H PRN, Colbert Ewing, MD, 4 mg at 10/16/17 0557 .  pantoprazole (PROTONIX) EC tablet 40 mg, 40 mg, Oral, Daily, Vena Rua, PA-C, 40 mg at 10/21/17 1149 .  piperacillin-tazobactam (ZOSYN) IVPB 3.375 g, 3.375 g, Intravenous, Q8H, Wynell Balloon, RPH, Stopped at 10/21/17 0730 .  sodium chloride flush (NS) 0.9 % injection 3 mL, 3 mL, Intravenous, Q12H, Maryellen Pile, MD, 3 mL at 10/21/17 1153 .  sodium chloride flush (NS) 0.9 % injection 3 mL, 3 mL, Intravenous, PRN, Maryellen Pile, MD .  vancomycin (VANCOCIN) IVPB 750 mg/150 ml premix, 750 mg, Intravenous, Q12H, Wynell Balloon, RPH, Stopped at 10/21/17 1000   OBJECTIVE:   VITAL SIGNS: BP 126/66   Pulse (!) 108   Temp 98.5 F (36.9 C) (Oral)   Resp (!) 23   Ht 5\' 7"  (1.702 m)   Wt 78 kg (171 lb 15.3 oz)   SpO2 95%   BMI 26.93 kg/m   Vitals:   10/21/17 0900 10/21/17 1000 10/21/17 1132 10/21/17 1156  BP: 123/67 122/71  126/66  Pulse: (!) 104 (!) 110  (!) 108  Resp: (!) 22 (!) 29  (!) 23  Temp:    98.5 F (36.9 C)  TempSrc:    Oral  SpO2: 95% 94% 93% 95%  Weight:      Height:        HEMODYNAMICS:    VENTILATOR SETTINGS: FiO2 (%):  [40 %] 40 %  INTAKE / OUTPUT: I/O last 3 completed shifts: In: 5480 [P.O.:120; I.V.:4510; IV Piggyback:850] Out: 1100 [Urine:1100]  PHYSICAL EXAMINATION: General:  Alert, awake and oriented to time, person and place. Cooperative. No acute distress. On 2 LPM. Head: normocephalic, atraumatic EYE: PERRLA, EOM intact. Scleral icterus. No pallor Nose: nares are patent. No polyps. No exudate. No sinus tenderness. Throat/Oral Cavity: No oral thrush. No exudate. Mucous membranes are moist. No tonsillar enlargement. Neck: supple, no thyromegaly, no JVD, no lymphadenopathy. Trachea midline. Chest/Lung: symmetric in development and  expansion. Good air entry. Diminished breath sounds in the bases. Bibasilar coarse crackles. No wheezes. Heart: Regular S1 and S2 without murmur, rub or gallop. Abdomen: soft, nontender, nondistended. Normoactive bowel sounds. No rebound. No guarding. Extremities: no pedal edema, no cyanosis, no clubbing. 2+ DP pulses Lymphatic: no cervical/axiallary/inguinal lymph nodes appreciated Skin:  Jaundice. NEURO: cranial nerves II-XII are grossly symmetric and physiologic. Babinski absent. No sensory deficit. No motor deficit. DTR 2+ @ RUE, 2+ @ LUE 2+ @ RLL,  2+ @ LLL. No cerebellar signs. Gait was not assessed.   LABS:  BMET Recent Labs  Lab 10/18/17 0207 10/19/17 0531 10/20/17 0310  NA 135 135 133*  K 3.9 4.7  4.2  CL 109 107 106  CO2 20* 20* 18*  BUN 6 6 9   CREATININE 0.99 0.93 0.87  GLUCOSE 129* 119* 124*    Electrolytes Recent Labs  Lab 10/17/17 2036 10/18/17 0207 10/19/17 0531 10/20/17 0310  CALCIUM  --  7.8* 8.2* 8.0*  MG 1.8  --   --   --     CBC Recent Labs  Lab 10/18/17 0207 10/19/17 0531 10/20/17 0310  WBC 8.1 10.5 12.1*  HGB 12.4* 12.6* 11.9*  HCT 35.4* 35.5* 33.4*  PLT 107* 130* 172    Coag's Recent Labs  Lab 10/17/17 0333  INR 1.09    Sepsis Markers Recent Labs  Lab 10/17/17 1507  LATICACIDVEN 1.4    ABG Recent Labs  Lab 10/17/17 1613 10/19/17 1845 10/20/17 1128  PHART 7.482* 7.443 7.463*  PCO2ART 27.4* 28.6* 26.7*  PO2ART 75.5* 88.9 90.1    Liver Enzymes Recent Labs  Lab 10/18/17 0207 10/19/17 0531 10/19/17 0907 10/20/17 0310  AST 50* 53*  --  62*  ALT 58 49  --  46  ALKPHOS 106 122  --  131*  BILITOT 8.3* 11.2* 12.2* 14.4*  ALBUMIN 2.0* 2.1*  --  1.9*    Cardiac Enzymes Recent Labs  Lab 10/19/17 1635  TROPONINI 0.03*    Glucose No results for input(s): GLUCAP in the last 168 hours.  Imaging No results found.  CULTURES: Results for orders placed or performed during the hospital encounter of 10/14/17  Blood  culture (routine x 2)     Status: None   Collection Time: 10/15/17  6:30 AM  Result Value Ref Range Status   Specimen Description BLOOD LEFT HAND  Final   Special Requests   Final    BOTTLES DRAWN AEROBIC AND ANAEROBIC Blood Culture results may not be optimal due to an inadequate volume of blood received in culture bottles   Culture NO GROWTH 5 DAYS  Final   Report Status 10/20/2017 FINAL  Final  Blood culture (routine x 2)     Status: None   Collection Time: 10/15/17  6:45 AM  Result Value Ref Range Status   Specimen Description BLOOD RIGHT HAND  Final   Special Requests   Final    BOTTLES DRAWN AEROBIC AND ANAEROBIC Blood Culture adequate volume   Culture NO GROWTH 5 DAYS  Final   Report Status 10/20/2017 FINAL  Final  Culture, blood (routine x 2)     Status: None (Preliminary result)   Collection Time: 10/17/17  4:25 AM  Result Value Ref Range Status   Specimen Description BLOOD LEFT ANTECUBITAL  Final   Special Requests   Final    BOTTLES DRAWN AEROBIC AND ANAEROBIC Blood Culture adequate volume   Culture NO GROWTH 3 DAYS  Final   Report Status PENDING  Incomplete  Culture, blood (routine x 2)     Status: None (Preliminary result)   Collection Time: 10/17/17  4:25 AM  Result Value Ref Range Status   Specimen Description BLOOD LEFT HAND  Final   Special Requests   Final    BOTTLES DRAWN AEROBIC ONLY Blood Culture adequate volume   Culture NO GROWTH 3 DAYS  Final   Report Status PENDING  Incomplete  Culture, expectorated sputum-assessment     Status: None   Collection Time: 10/17/17 10:50 AM  Result Value Ref Range Status   Specimen Description SPUTUM  Final   Special Requests NONE  Final   Sputum evaluation   Final  Sputum specimen not acceptable for testing.  Please recollect.   Gram Stain Report Called to,Read Back By and Verified With: A FOWLER,RN AT 2094 10/17/17 BY L BENFIELD    Report Status 10/17/2017 FINAL  Final  Gastrointestinal Panel by PCR , Stool     Status:  Abnormal   Collection Time: 10/17/17  8:47 PM  Result Value Ref Range Status   Campylobacter species NOT DETECTED NOT DETECTED Final   Plesimonas shigelloides NOT DETECTED NOT DETECTED Final   Salmonella species NOT DETECTED NOT DETECTED Final   Yersinia enterocolitica NOT DETECTED NOT DETECTED Final   Vibrio species NOT DETECTED NOT DETECTED Final   Vibrio cholerae NOT DETECTED NOT DETECTED Final   Enteroaggregative E coli (EAEC) NOT DETECTED NOT DETECTED Final   Enteropathogenic E coli (EPEC) DETECTED (A) NOT DETECTED Final    Comment: RESULT CALLED TO, READ BACK BY AND VERIFIED WITH: [facility] JONES 10/18/17 @ 1813 MLK    Enterotoxigenic E coli (ETEC) NOT DETECTED NOT DETECTED Final   Shiga like toxin producing E coli (STEC) NOT DETECTED NOT DETECTED Final   Shigella/Enteroinvasive E coli (EIEC) NOT DETECTED NOT DETECTED Final   Cryptosporidium NOT DETECTED NOT DETECTED Final   Cyclospora cayetanensis NOT DETECTED NOT DETECTED Final   Entamoeba histolytica NOT DETECTED NOT DETECTED Final   Giardia lamblia NOT DETECTED NOT DETECTED Final   Adenovirus F40/41 NOT DETECTED NOT DETECTED Final   Astrovirus NOT DETECTED NOT DETECTED Final   Norovirus GI/GII NOT DETECTED NOT DETECTED Final   Rotavirus A NOT DETECTED NOT DETECTED Final   Sapovirus (I, II, IV, and V) NOT DETECTED NOT DETECTED Final    Comment: Performed at [facility], [address] [place], [place] [address]  Respiratory Panel by PCR     Status: None   Collection Time: 10/17/17  8:47 PM  Result Value Ref Range Status   Adenovirus NOT DETECTED NOT DETECTED Final   Coronavirus 229E NOT DETECTED NOT DETECTED Final   Coronavirus HKU1 NOT DETECTED NOT DETECTED Final   Coronavirus NL63 NOT DETECTED NOT DETECTED Final   Coronavirus OC43 NOT DETECTED NOT DETECTED Final   Metapneumovirus NOT DETECTED NOT DETECTED Final   Rhinovirus / Enterovirus NOT DETECTED NOT DETECTED Final   Influenza A NOT DETECTED NOT DETECTED  Final   Influenza B NOT DETECTED NOT DETECTED Final   Parainfluenza Virus 1 NOT DETECTED NOT DETECTED Final   Parainfluenza Virus 2 NOT DETECTED NOT DETECTED Final   Parainfluenza Virus 3 NOT DETECTED NOT DETECTED Final   Parainfluenza Virus 4 NOT DETECTED NOT DETECTED Final   Respiratory Syncytial Virus NOT DETECTED NOT DETECTED Final   Bordetella pertussis NOT DETECTED NOT DETECTED Final   Chlamydophila pneumoniae NOT DETECTED NOT DETECTED Final   Mycoplasma pneumoniae NOT DETECTED NOT DETECTED Final  Culture, expectorated sputum-assessment     Status: None   Collection Time: 10/17/17  9:45 PM  Result Value Ref Range Status   Specimen Description EXPECTORATED SPUTUM  Final   Special Requests NONE  Final   Sputum evaluation THIS SPECIMEN IS ACCEPTABLE FOR SPUTUM CULTURE  Final   Report Status 10/18/2017 FINAL  Final  Acid Fast Smear (AFB)     Status: None   Collection Time: 10/17/17  9:45 PM  Result Value Ref Range Status   AFB Specimen Processing Concentration  Final   Acid Fast Smear Negative  Final    Comment: (NOTE) Performed At: [facility] [place], [place] [id number] Rush Farmer MD  NO:[id number]    Source (AFB) EXPECTORATED SPUTUM  Final  Culture, respiratory (NON-Expectorated)     Status: None   Collection Time: 10/17/17  9:45 PM  Result Value Ref Range Status   Specimen Description EXPECTORATED SPUTUM  Final   Special Requests NONE Reflexed from [id number]  Final   Gram Stain   Final    MODERATE WBC PRESENT, PREDOMINANTLY MONONUCLEAR FEW SQUAMOUS EPITHELIAL CELLS PRESENT FEW BUDDING YEAST SEEN RARE GRAM POSITIVE COCCI IN PAIRS    Culture FEW CANDIDA TROPICALIS  Final   Report Status 10/20/2017 FINAL  Final  Culture, blood (Routine X 2) w Reflex to ID Panel     Status: None (Preliminary result)   Collection Time: 10/19/17  7:27 PM  Result Value Ref Range Status   Specimen Description BLOOD LEFT ANTECUBITAL  Final   Special Requests   Final     BOTTLES DRAWN AEROBIC AND ANAEROBIC Blood Culture adequate volume   Culture NO GROWTH < 24 HOURS  Final   Report Status PENDING  Incomplete  Culture, blood (Routine X 2) w Reflex to ID Panel     Status: None (Preliminary result)   Collection Time: 10/19/17  7:29 PM  Result Value Ref Range Status   Specimen Description BLOOD BLOOD RIGHT HAND  Final   Special Requests IN PEDIATRIC BOTTLE Blood Culture adequate volume  Final   Culture NO GROWTH < 24 HOURS  Final   Report Status PENDING  Incomplete    OTHER STUDIES:  -  ANTIBIOTICS: Zosyn 1/10 vancomycing 1/10 Levaquin 1/13  SIGNIFICANT EVENTS: 1/13 >> increased FiO2 requirements to 12 LPM... Then BiPAP 40%. Transfer to [facility] 1/14 >> on empiric antibiotics for aspiration pneumonia... Serum total IgE 678   Renee Pain, MD Board Certified by the ABIM, [place] Pager: [phone]  10/21/2017, 1:04 PM

## 2017-10-21 NOTE — Care Management Important Message (Signed)
Important Message  Patient Details  Name: [name] MRN: [id number] Date of Birth: Aug 21, 1946   Medicare Important Message Given:  Yes    [name] 10/21/2017, 3:54 PM

## 2017-10-21 NOTE — Care Management Note (Addendum)
Case Management Note  Patient Details  Name: [name] MRN: [id number] Date of Birth: 1945/12/31  Subjective/Objective:  From home with his cousin , patient states he is not sure that he has 24 hr care at home. He states if he needs to go to a rehab facility then he would.  Will await pt eval to see patient again. Presents with sepsis secondary to asp pna, cholestatic liver injury, sinus tachycardia, chronic lumbar back pain, copd, complicated by worsening increased bilirubin, presistant abd pain and worsening resp status, pharmacy consulted for TPN, kub on 1/12 with ileus,.  conts on bipap last night, now HFNC 10, on levaquin, vanc, zosyn.  Plan for SNF vs Home.         Action/Plan: NCM will follow for dc needs.   Expected Discharge Date:  10/16/17               Expected Discharge Plan:  [place]  In-House Referral:     Discharge planning Services  CM Consult  Post Acute Care Choice:    Choice offered to:     DME Arranged:    DME Agency:     HH Arranged:    HH Agency:     Status of Service:  In process, will continue to follow  If discussed at Long Length of Stay Meetings, dates discussed:    Additional Comments:  Zenon Mayo, RN 10/21/2017, 3:52 PM

## 2017-10-21 NOTE — Progress Notes (Addendum)
Daily Rounding Note  10/21/2017, 8:40 AM  LOS: 7 days   SUBJECTIVE:   Chief complaint:     Moved to step down unit yesterday and placed on bipap over weekend with increased resp distress. Breathing is better but high [place] oxygen requirements off bipap Had some cough and choking that resolved after hardened oral secretions removed from back of throat by resp therapist.    OBJECTIVE:         Vital signs in last 24 hours:    Temp:  [97.6 F (36.4 C)-99.3 F (37.4 C)] 97.9 F (36.6 C) (01/14 0739) Pulse Rate:  [91-116] 100 (01/14 0739) Resp:  [21-35] 25 (01/14 0739) BP: (106-143)/(67-97) 130/76 (01/14 0739) SpO2:  [88 %-99 %] 88 % (01/14 0824) FiO2 (%):  [40 %] 40 % (01/13 2200) Last BM Date: 10/19/17 Filed Weights   10/14/17 0510 10/14/17 1902 10/19/17 0535  Weight: 78 kg (172 lb) 76.9 kg (169 lb 8.5 oz) 78 kg (171 lb 15.3 oz)   General: more jaundiced.  Looks unwell and weak but actually looks stronger than at times last week   Heart: sinus tachy in low 100s Chest: not coughing, slight dyspnea with speaking Abdomen: soft, NT, ND.  Active BS  Extremities: no CCE Neuro/Psych:  Oriented x 3.  Alert, appropriated and moves all limbs.    Intake/Output from previous day: 01/13 0701 - 01/14 0700 In: 3809.2 [P.O.:120; I.V.:3089.2; IV Piggyback:600] Out: 1100 [Urine:1100]  Intake/Output this shift: No intake/output data recorded.  Lab Results: Recent Labs    10/19/17 0531 10/20/17 0310  WBC 10.5 12.1*  HGB 12.6* 11.9*  HCT 35.5* 33.4*  PLT 130* 172   BMET Recent Labs    10/19/17 0531 10/20/17 0310  NA 135 133*  K 4.7 4.2  CL 107 106  CO2 20* 18*  GLUCOSE 119* 124*  BUN 6 9  CREATININE 0.93 0.87  CALCIUM 8.2* 8.0*   LFT Recent Labs    10/19/17 0531 10/19/17 0907 10/20/17 0310  PROT 6.1*  --  6.0*  ALBUMIN 2.1*  --  1.9*  AST 53*  --  62*  ALT 49  --  46  ALKPHOS 122  --  131*  BILITOT 11.2*  12.2* 14.4*  BILIDIR  --  7.9* 9.3*  IBILI  --  4.3* 5.1*   PT/INR No results for input(s): LABPROT, INR in the last 72 hours. Hepatitis Panel No results for input(s): HEPBSAG, HCVAB, HEPAIGM, HEPBIGM in the last 72 hours.  Studies/Results: Dg Chest 2 View  Result Date: 10/19/2017 CLINICAL DATA:  Shortness of Breath EXAM: CHEST  2 VIEW COMPARISON:  10/17/2017 FINDINGS: Diffuse interstitial prominence throughout the lungs, with peribronchial thickening. Previously seen areas of consolidation in the upper lobes has improved. Heart is normal size. No effusions. IMPRESSION: Severe diffuse interstitial prominence throughout the lungs. The confluent ground-glass opacities in the upper lobes have improved. Electronically Signed   By: Rolm Baptise M.D.   On: 10/19/2017 12:46   Dg Chest Port 1 View  Result Date: 10/19/2017 CLINICAL DATA:  Shortness of breath and chest pain EXAM: PORTABLE CHEST 1 VIEW COMPARISON:  10/19/2017 FINDINGS: Cardiac shadow is stable. Aortic calcifications are noted. Diffuse interstitial prominence is again identified throughout both lungs stable in appearance from the prior exam. No focal infiltrate or sizable effusion is seen. No bony abnormality is noted. IMPRESSION: Stable interstitial changes bilaterally. Electronically Signed   By: Linus Mako.D.  On: 10/19/2017 18:56   Dg Abd 2 Views  Result Date: 10/19/2017 CLINICAL DATA:  Abdominal pain EXAM: ABDOMEN - 2 VIEW COMPARISON:  10/16/2017 FINDINGS: Gas within nondistended large bowel. Mild gaseous distention of bowel loops in the abdomen. No organomegaly or free air. No suspicious calcification. IMPRESSION: Mild gaseous distention of bowel loops in the abdomen, suspect mild ileus. Electronically Signed   By: Rolm Baptise M.D.   On: 10/19/2017 12:47   Scheduled Meds: . albuterol  2.5 mg Nebulization QID  . budesonide (PULMICORT) nebulizer solution  0.25 mg Nebulization BID  . chlorhexidine  15 mL Mouth Rinse BID  .  gabapentin  300 mg Oral BID  . mouth rinse  15 mL Mouth Rinse q12n4p  . pantoprazole  40 mg Oral Daily  . sodium chloride flush  3 mL Intravenous Q12H   Continuous Infusions: . sodium chloride    . dexrose 5 % and 0.45 % NaCl with KCl 30 mEq/L 125 mL/hr at 10/21/17 0402  . levofloxacin (LEVAQUIN) IV Stopped (10/20/17 1502)  . piperacillin-tazobactam (ZOSYN)  IV Stopped (10/21/17 0730)  . vancomycin Stopped (10/20/17 2125)   PRN Meds:.sodium chloride, acetaminophen, albuterol, chlorproMAZINE, morphine injection, ondansetron (ZOFRAN) IV, sodium chloride flush   ASSESMENT:   *  Intrahepatic cholestasis.  Suspicion for drug induced.  CT # 2 on 1/10: enlarged left lobe of liver with mild heterogeneity, ? Cirrhosis.  Tests pending include EBV, ANA,  AMA.     *  abd pain, n/v: retained gastric contents on 1/8 EGD, no ulcers etc though very high doses of ASA PTA.  Urine porphobilinogen is processing.  On prophylactic Protonix.    * Protein malnutrition.  NPO for bipap which is now discontinued.    *  enteropathogenic E coli detected on stool path panel.  However his last BMs were 1/8- 1/9.  No BMs since.    *  Thrombocytopenia, resolved.    *  Asp PNA vs HCAP vs acute lung injury.  Fever.  Growing Candida tropicalis in sputum: colonizer.  On Levaquin, VAnc, Zosyn.  ID following.      PLAN   *  Now that he is off bipap, spoke with resp therapist re PO.  She and I both observed pt swallowing water with no difficulty.  Will allow full liquids and resume Glucerna supplements.      Azucena Freed  10/21/2017, 8:40 AM Pager: [phone]   Attending physician's note   I have taken an interval history, reviewed the chart and examined the patient. I agree with the Advanced Practitioner's note, impression and recommendations.  Patient reports that abdominal pain is slightly better but on exam abdomen is significantly distended, firm and tympanic anteriorly. No BM and doesn't recall passing  flatus He is not tolerating much PO intake, he is on liquid diet, pain is worse when he drinks more than few sips and he regurgitates it back Reviewed CT abd and pelvis, no significant findings to explain the etiology for his symptoms He did have large amount of food in gastric fundus suggestive of gastroparesis but it seems less likely to be the primary disease process based on his presensation and is probably secondary to whatever is happening down stream in the intestines effecting the gut motility and ?ileus. Ordered Abdominal X-ray today as exam is concerning Turn in bed every 2-4 hours Continue liquids as tolerated Will need to consider TPN to maintain nutritional status and patient is not tolerating much PO intake or meeting his calorie  requirement. Consult pharmacy for TPN Check PT/INR and pre albumin with labs tomorrow AM ANA negative Inflammatory markers, CRP, ferritin and LDH continue to rise EBV and urine porphobilinogen pending Bilirubin continues to rise ?Drug induced liver injury vs ? worsening decompensation of cirrhosis with liver failure, seems less likely given INR was normal on 10/17/17 Consider DC levofloxacin or Zosyn    K Denzil Magnuson, MD [phone] Mon-Fri 8a-5p [phone] after 5p, weekends, holidays

## 2017-10-21 NOTE — Progress Notes (Signed)
  Date: 10/21/2017  Patient name: [name]  Medical record number: [id number]  Date of birth: [birth year]   I have seen and evaluated this patient and I have discussed the plan of care with the house staff. Please see their note for complete details. I concur with their findings with the following additions/corrections:   Reports feeling slightly better today, but overall trend is still concerning. On HFNC this morning but still tachypneic to the 30s. Abdomen is more distended again today, tight but not too tender. Still unable to eat and drink more than a few sips. Bilirubin now up to 19, AST/ALT and alk phos also going up again after normalizing earlier. Thrombocytopenia has resolved, leukocytosis continues, mild anemia worsening. LDH up to 400s, unclear if from hemolysis or lung origin. CRP remains elevated and ferritin is quite high at 1100, suggestive of significant inflammation.  Multiple tests have now returned negative, including ANA, ACE, RVP, GI panel, Legionella, HIV, EBV, CMV, and hepatitis viral panel. IgE elevated (unclear significance). ANCA, anti-mitochondrial, porphobilinogen, AFB, aspergillus all pending.  Etiology of acute liver failure unclear. Spoke with GI at [facility], based on severe hyperbilirubinemia with relatively mild hepatocellular injury, likely liver parenchymal disease, of which decompensated cirrhosis is most likely, especially with alcohol history. He recommended repeat abd [place] with doppler and possibly repeat CT abd. We also discussed transfer given his MELD is now up to 24, but no beds were available today. [name] was not interested in discussing a transfer to [facility].  Pulmonary disease also unclear. Does not appear to be due to direct effects of hepatic failure, such as hepatic hydrothorax or ascites. Despite significant IV hydration, he does not appear volume overloaded on exam. Infectious or inflammatory interstitial pneumonitis seem most likely, and we  have continued vancomycin, zosyn and levofloxacin. ID recommends discontinuing levofloxacin.   Further diagnostic testing may be needed, but unclear what to pursue and what he can tolerate. MRCP or liver MRI might give [place] a better sense of his hepatic failure, but I do not think he could tolerate that right now. Bronchoscopy may also be needed for further evaluation of his pulmonary process. Given his deterioration, I will continue to look into transfer with his consent.  Oda Kilts, MD 10/21/2017, 10:10 PM

## 2017-10-21 NOTE — Progress Notes (Signed)
Patient ID: [name], male   DOB: 18-Jan-1946, 72 y.o.   MRN: [id number]          [facility]  Date of Admission:  10/14/2017    Total days of antibiotics 8        Day 2 vancomycin        Day 2 piperacillin tazobactam        Day 2 levofloxacin ASSESSMENT: He appears more comfortable today but his bilirubin continues to climb and his alkaline phosphatase is going back up.  We still do not have a unifying diagnosis for his hepatic or pulmonary issues.  PLAN: 1. Continue piperacillin tazobactam and vancomycin 2. Discontinue levofloxacin 3. Repeat sputum culture 4. Recommend repeat chest x-ray  Principal Problem:   Cholestasis Active Problems:   Hepatitis   Thrombocytopenia (HCC)   Pulmonary infiltrates   Fever   COPD (chronic obstructive pulmonary disease) (HCC)   Abdominal pain   Gastroparesis   Abnormal chest CT   Acute respiratory distress   Acute respiratory alkalosis   Mediastinal lymphadenopathy   Pleural effusion, bilateral   Abdominal distention   Enteropathogenic Escherichia coli infection   Mild malnutrition (HCC)   Scheduled Meds: . albuterol  2.5 mg Nebulization QID  . budesonide (PULMICORT) nebulizer solution  0.25 mg Nebulization BID  . chlorhexidine  15 mL Mouth Rinse BID  . feeding supplement (GLUCERNA SHAKE)  237 mL Oral TID BM  . gabapentin  300 mg Oral BID  . mouth rinse  15 mL Mouth Rinse q12n4p  . pantoprazole  40 mg Oral Daily  . sodium chloride flush  3 mL Intravenous Q12H   Continuous Infusions: . sodium chloride    . dexrose 5 % and 0.45 % NaCl with KCl 30 mEq/L 125 mL/hr at 10/21/17 1258  . levofloxacin (LEVAQUIN) IV 750 mg (10/21/17 1148)  . piperacillin-tazobactam (ZOSYN)  IV Stopped (10/21/17 0730)  . vancomycin Stopped (10/21/17 1000)   PRN Meds:.sodium chloride, acetaminophen, albuterol, chlorproMAZINE, LORazepam, morphine injection, ondansetron (ZOFRAN) IV, sodium chloride flush   SUBJECTIVE: He  is feeling a little bit better today.  He is no longer having any abdominal pain but he is having abdominal distention and is not passing any gas.  He says that his cough is a little bit better but still productive of yellowish gray sputum.  Review of Systems: Review of Systems  Constitutional: Negative for chills, diaphoresis and fever.  Respiratory: Positive for cough, sputum production and shortness of breath.   Cardiovascular: Negative for chest pain.  Gastrointestinal: Positive for constipation. Negative for abdominal pain, diarrhea, nausea and vomiting.    No Known Allergies  OBJECTIVE: Vitals:   10/21/17 0900 10/21/17 1000 10/21/17 1132 10/21/17 1156  BP: 123/67 122/71  126/66  Pulse: (!) 104 (!) 110  (!) 108  Resp: (!) 22 (!) 29  (!) 23  Temp:    98.5 F (36.9 C)  TempSrc:    Oral  SpO2: 95% 94% 93% 95%  Weight:      Height:       Body mass index is 26.93 kg/m.  Physical Exam  Constitutional:  He appears more comfortable today.  Cardiovascular: Normal rate and regular rhythm.  No murmur heard. Pulmonary/Chest: Effort normal.  Diffuse rhonchi.  He is using nasal prong oxygen.  Abdominal: Soft. He exhibits distension. There is no tenderness.  Tympanitic with quiet bowel sounds.    Lab Results Lab Results  Component Value Date   WBC 14.7 (  H) 10/21/2017   HGB 10.6 (L) 10/21/2017   HCT 30.2 (L) 10/21/2017   MCV 91.0 10/21/2017   PLT 228 10/21/2017    Lab Results  Component Value Date   CREATININE 0.85 10/21/2017   BUN 11 10/21/2017   NA 130 (L) 10/21/2017   K 3.9 10/21/2017   CL 103 10/21/2017   CO2 17 (L) 10/21/2017    Lab Results  Component Value Date   ALT 52 10/21/2017   AST 92 (H) 10/21/2017   ALKPHOS 209 (H) 10/21/2017   BILITOT 19.4 (HH) 10/21/2017     Microbiology: Recent Results (from the past 240 hour(s))  Blood culture (routine x 2)     Status: None   Collection Time: 10/15/17  6:30 AM  Result Value Ref Range Status   Specimen  Description BLOOD LEFT HAND  Final   Special Requests   Final    BOTTLES DRAWN AEROBIC AND ANAEROBIC Blood Culture results may not be optimal due to an inadequate volume of blood received in culture bottles   Culture NO GROWTH 5 DAYS  Final   Report Status 10/20/2017 FINAL  Final  Blood culture (routine x 2)     Status: None   Collection Time: 10/15/17  6:45 AM  Result Value Ref Range Status   Specimen Description BLOOD RIGHT HAND  Final   Special Requests   Final    BOTTLES DRAWN AEROBIC AND ANAEROBIC Blood Culture adequate volume   Culture NO GROWTH 5 DAYS  Final   Report Status 10/20/2017 FINAL  Final  Culture, blood (routine x 2)     Status: None (Preliminary result)   Collection Time: 10/17/17  4:25 AM  Result Value Ref Range Status   Specimen Description BLOOD LEFT ANTECUBITAL  Final   Special Requests   Final    BOTTLES DRAWN AEROBIC AND ANAEROBIC Blood Culture adequate volume   Culture NO GROWTH 3 DAYS  Final   Report Status PENDING  Incomplete  Culture, blood (routine x 2)     Status: None (Preliminary result)   Collection Time: 10/17/17  4:25 AM  Result Value Ref Range Status   Specimen Description BLOOD LEFT HAND  Final   Special Requests   Final    BOTTLES DRAWN AEROBIC ONLY Blood Culture adequate volume   Culture NO GROWTH 3 DAYS  Final   Report Status PENDING  Incomplete  Culture, expectorated sputum-assessment     Status: None   Collection Time: 10/17/17 10:50 AM  Result Value Ref Range Status   Specimen Description SPUTUM  Final   Special Requests NONE  Final   Sputum evaluation   Final    Sputum specimen not acceptable for testing.  Please recollect.   Gram Stain Report Called to,Read Back By and Verified With: A FOWLER,RN AT 3664 10/17/17 BY L BENFIELD    Report Status 10/17/2017 FINAL  Final  Gastrointestinal Panel by PCR , Stool     Status: Abnormal   Collection Time: 10/17/17  8:47 PM  Result Value Ref Range Status   Campylobacter species NOT DETECTED NOT  DETECTED Final   Plesimonas shigelloides NOT DETECTED NOT DETECTED Final   Salmonella species NOT DETECTED NOT DETECTED Final   Yersinia enterocolitica NOT DETECTED NOT DETECTED Final   Vibrio species NOT DETECTED NOT DETECTED Final   Vibrio cholerae NOT DETECTED NOT DETECTED Final   Enteroaggregative E coli (EAEC) NOT DETECTED NOT DETECTED Final   Enteropathogenic E coli (EPEC) DETECTED (A) NOT DETECTED Final  Comment: RESULT CALLED TO, READ BACK BY AND VERIFIED WITH: Precious Gilding 10/18/17 @ 1813 MLK    Enterotoxigenic E coli (ETEC) NOT DETECTED NOT DETECTED Final   Shiga like toxin producing E coli (STEC) NOT DETECTED NOT DETECTED Final   Shigella/Enteroinvasive E coli (EIEC) NOT DETECTED NOT DETECTED Final   Cryptosporidium NOT DETECTED NOT DETECTED Final   Cyclospora cayetanensis NOT DETECTED NOT DETECTED Final   Entamoeba histolytica NOT DETECTED NOT DETECTED Final   Giardia lamblia NOT DETECTED NOT DETECTED Final   Adenovirus F40/41 NOT DETECTED NOT DETECTED Final   Astrovirus NOT DETECTED NOT DETECTED Final   Norovirus GI/GII NOT DETECTED NOT DETECTED Final   Rotavirus A NOT DETECTED NOT DETECTED Final   Sapovirus (I, II, IV, and V) NOT DETECTED NOT DETECTED Final    Comment: Performed at [facility], [address] [place], [place] [address]  Respiratory Panel by PCR     Status: None   Collection Time: 10/17/17  8:47 PM  Result Value Ref Range Status   Adenovirus NOT DETECTED NOT DETECTED Final   Coronavirus 229E NOT DETECTED NOT DETECTED Final   Coronavirus HKU1 NOT DETECTED NOT DETECTED Final   Coronavirus NL63 NOT DETECTED NOT DETECTED Final   Coronavirus OC43 NOT DETECTED NOT DETECTED Final   Metapneumovirus NOT DETECTED NOT DETECTED Final   Rhinovirus / Enterovirus NOT DETECTED NOT DETECTED Final   Influenza A NOT DETECTED NOT DETECTED Final   Influenza B NOT DETECTED NOT DETECTED Final   Parainfluenza Virus 1 NOT DETECTED NOT DETECTED Final    Parainfluenza Virus 2 NOT DETECTED NOT DETECTED Final   Parainfluenza Virus 3 NOT DETECTED NOT DETECTED Final   Parainfluenza Virus 4 NOT DETECTED NOT DETECTED Final   Respiratory Syncytial Virus NOT DETECTED NOT DETECTED Final   Bordetella pertussis NOT DETECTED NOT DETECTED Final   Chlamydophila pneumoniae NOT DETECTED NOT DETECTED Final   Mycoplasma pneumoniae NOT DETECTED NOT DETECTED Final  Culture, expectorated sputum-assessment     Status: None   Collection Time: 10/17/17  9:45 PM  Result Value Ref Range Status   Specimen Description EXPECTORATED SPUTUM  Final   Special Requests NONE  Final   Sputum evaluation THIS SPECIMEN IS ACCEPTABLE FOR SPUTUM CULTURE  Final   Report Status 10/18/2017 FINAL  Final  Acid Fast Smear (AFB)     Status: None   Collection Time: 10/17/17  9:45 PM  Result Value Ref Range Status   AFB Specimen Processing Concentration  Final   Acid Fast Smear Negative  Final    Comment: (NOTE) Performed At: [facility] [address] [place], [place] [id number] Rush Farmer MD DJ:[id number]    Source (AFB) EXPECTORATED SPUTUM  Final  Culture, respiratory (NON-Expectorated)     Status: None   Collection Time: 10/17/17  9:45 PM  Result Value Ref Range Status   Specimen Description EXPECTORATED SPUTUM  Final   Special Requests NONE Reflexed from [id number]  Final   Gram Stain   Final    MODERATE WBC PRESENT, PREDOMINANTLY MONONUCLEAR FEW SQUAMOUS EPITHELIAL CELLS PRESENT FEW BUDDING YEAST SEEN RARE GRAM POSITIVE COCCI IN PAIRS    Culture FEW CANDIDA TROPICALIS  Final   Report Status 10/20/2017 FINAL  Final  Culture, blood (Routine X 2) w Reflex to ID Panel     Status: None (Preliminary result)   Collection Time: 10/19/17  7:27 PM  Result Value Ref Range Status   Specimen Description BLOOD LEFT ANTECUBITAL  Final   Special Requests  Final    BOTTLES DRAWN AEROBIC AND ANAEROBIC Blood Culture adequate volume   Culture NO GROWTH < 24 HOURS  Final    Report Status PENDING  Incomplete  Culture, blood (Routine X 2) w Reflex to ID Panel     Status: None (Preliminary result)   Collection Time: 10/19/17  7:29 PM  Result Value Ref Range Status   Specimen Description BLOOD BLOOD RIGHT HAND  Final   Special Requests IN PEDIATRIC BOTTLE Blood Culture adequate volume  Final   Culture NO GROWTH < 24 HOURS  Final   Report Status PENDING  Incomplete    Michel Bickers, MD [facility] [phone] pager   [phone] cell 10/21/2017, 2:17 PM

## 2017-10-21 NOTE — Progress Notes (Signed)
Pt taken off bipap and placed on 8L HFNC, increased to 10L after pt spo2 dropped to 85%. Pt with extremely dry secretions stuck in back of mouth and throat. Pt given a small sip of water that he began coughing after. Pt sat up straight with this RT and got out a small amount of very hard/dry mucous. Pt given HHN tx and RT stayed at bedside until pt spo2 improved to 93%, pt denies sob, no increased WOB, Pt states he is finally beginning to feel like he can catch his breath. RT will closely monitor pt.

## 2017-10-21 NOTE — Discharge Summary (Signed)
Name: [name] MRN: [id number] DOB: 08-31-1946 72 y.o. PCP: Lorella Nimrod, MD  Date of Admission: 10/14/2017  5:06 AM Date of Discharge:  Attending Physician: Oda Kilts, MD  TRANSFER NOTE  Discharge Diagnosis: 1. Abdominal Pain with Nausea/Vomiting   Principal Problem:   Hepatic failure (HCC) Active Problems:   COPD (chronic obstructive pulmonary disease) (HCC)   Abdominal pain   Gastroparesis   Cholestasis   Thrombocytopenia (HCC)   Abnormal chest CT   Pulmonary infiltrates   Acute respiratory distress   Acute respiratory alkalosis   Mediastinal lymphadenopathy   Pleural effusion, bilateral   Fever   Abdominal distension (gaseous)   Enteropathogenic Escherichia coli infection   Mild malnutrition (HCC)   Ground glass opacity present on imaging of lung   Acute respiratory failure with hypoxia (HCC)   Hypoxemia   Malnutrition of moderate degree  Allergies as of 10/22/2017   No Known Allergies     Medication List    TAKE these medications   acetaminophen 325 MG tablet Commonly known as:  TYLENOL Take 2 tablets (650 mg total) by mouth every 6 (six) hours as needed for mild pain or moderate pain.   alendronate 70 MG tablet Commonly known as:  FOSAMAX Take 1 tablet (70 mg total) by mouth every 7 (seven) days. Take with a full glass of water on an empty stomach.   aspirin EC 81 MG tablet Take 1 tablet (81 mg total) by mouth daily.   atorvastatin 20 MG tablet Commonly known as:  LIPITOR Take 1 tablet (20 mg total) by mouth daily.   CENTRUM SILVER ADULT 50+ Tabs Take 1 tablet by mouth daily.   Diclofenac Sodium 3 % Gel Place 1 application onto the skin 2 (two) times daily. To affected area.   furosemide 20 MG tablet Commonly known as:  LASIX Take 1 tablet (20 mg total) by mouth daily. For leg swelling.   gabapentin 300 MG capsule Commonly known as:  NEURONTIN Take 1 capsule (300 mg total) by mouth 2 (two) times daily.     HYDROcodone-acetaminophen 5-325 MG tablet Commonly known as:  NORCO Take 1 tablet by mouth every 6 (six) hours as needed for moderate pain.   meloxicam 7.5 MG tablet Commonly known as:  MOBIC Take 7.5 mg by mouth daily.   PROAIR HFA 108 (90 Base) MCG/ACT inhaler Generic drug:  albuterol INHALE 2 PUFFS INTO THE LUNGS FOUR TIMES DAILY What changed:  See the new instructions.   tiotropium 18 MCG inhalation capsule Commonly known as:  SPIRIVA Place 1 capsule (18 mcg total) into inhaler and inhale daily.   tiZANidine 2 MG tablet Commonly known as:  ZANAFLEX Take 2 mg by mouth every 6 (six) hours as needed for muscle spasms.      Hospital Course by problem list:   1.  Abdominal pain with nausea/vomiting.  [name] is a 72 year old male with a history of hypertension, tobacco use, and chronic lumbar back pain who presented on 10/14/17 with acute onset diffuse abdominal pain starting the night prior. Initial physical exam was positive for diffuse tenderness and peritoneal signs and acute abdomen was a primary concern. Imaging of the abdomen including abdominal plain films and abdominal CT illustrated no free air. Surgery was consulted and felt that acute abdomen or visceral perforation was less likely. CMP was significant for cholestatic liver injury. Abdominal ultrasound at the time illustrated a contracted gallbladder with no biliary duct dilation, gallbladder wall thickening, or pericystic fluid. CT  abdomen with contrast illustrated no gallstones, biliary ductal dilation, or other intra-abdominal abnormalities. Over the course of the subsequent 2-3 days patient's AST, ALT, and alk phos trended back to normal levels. However the patient's bilirubin continued to elevate, analysis indicated primarily direct bilirubin. Hemolysis was ruled out. The patient subsequently developed fevers and cough. A chest x-ray was obtained that showed bilateral opacities/infiltrates with right-sided volume  loss. CT of the chest was performed that showed large groundglass consolidations within the perihilar and upper lobe regions bilaterally. Pulmonology was consulted who felt the differential for these groundglass opacities was broad; however, could be explained by aspiration. The patient's empiric antibiotic therapy was de-escalated to Unasyn. Unfortunately the patient continued to spike fevers and his respiratory status declined requiring intermittent BiPAP therapy and is currently on HFNC 14L/min with RR in the 30s. Antibiotics were broadened to Vanc, Zosyn, and Levaquin with infectious diseases assistance. On the day of requested transfer the patient's bilirubin was elevated to 21.8, and his ALT, AST, and alk phos all began to trend back up. GI and pulmonology recommended transfer to a tertiary care center for further evaluation. Infectious disease de-escalated antibiotics to Zosyn as it is now felt that his pulmonary findings are secondary to acute lung injury. Pulmonology was recommending biopsy vs VATS.  Multiple labs have been obtained in order to determine the etiology of this patient's clinical symptoms (imaging/labs can be seen below). It is difficult to connect the patient's diffuse abdominal pain, cholestatic liver injury, and acute groundglass opacities on chest CT.   Pending labs: Aspergillus antibodies [employer] Mitochondrial antibodies Urine porphobilinogen  Discharge Vitals:   BP 127/76 (BP Location: Left Arm)   Pulse (!) 101   Temp 99.6 F (37.6 C) (Oral)   Resp (!) 33   Ht _0  (1.702 m)   Wt 172 lb 13.5 oz (78.4 kg)   SpO2 96%   BMI 27.07 kg/m   Pertinent Labs, Studies, and Procedures:  Recent Results (from the past 2160 hour(s))  BMP8+Anion Gap     Status: Abnormal   Collection Time: 09/04/17  1:41 PM  Result Value Ref Range   Glucose 86 65 - 99 mg/dL   BUN 8 8 - 27 mg/dL   Creatinine, Ser 1.01 0.76 - 1.27 mg/dL   GFR calc non Af Amer 74 >59 mL/min/1.73   GFR calc  Af Amer 86 >59 mL/min/1.73   BUN/Creatinine Ratio 8 (L) 10 - 24   Sodium 141 134 - 144 mmol/L   Potassium 4.4 3.5 - 5.2 mmol/L   Chloride 104 96 - 106 mmol/L   CO2 24 20 - 29 mmol/L   Anion Gap 13.0 10.0 - 18.0 mmol/L   Calcium 9.0 8.6 - 10.2 mg/dL  Lipase, blood     Status: None   Collection Time: 10/14/17  5:12 AM  Result Value Ref Range   Lipase 23 11 - 51 U/L  Comprehensive metabolic panel     Status: Abnormal   Collection Time: 10/14/17  5:12 AM  Result Value Ref Range   Sodium 137 135 - 145 mmol/L   Potassium 3.9 3.5 - 5.1 mmol/L   Chloride 102 101 - 111 mmol/L   CO2 24 22 - 32 mmol/L   Glucose, Bld 154 (H) 65 - 99 mg/dL   BUN 9 6 - 20 mg/dL   Creatinine, Ser 1.08 0.61 - 1.24 mg/dL   Calcium 9.2 8.9 - 10.3 mg/dL   Total Protein 7.3 6.5 - 8.1 g/dL  Albumin 3.9 3.5 - 5.0 g/dL   AST 469 (H) 15 - 41 U/L   ALT 241 (H) 17 - 63 U/L   Alkaline Phosphatase 205 (H) 38 - 126 U/L   Total Bilirubin 3.9 (H) 0.3 - 1.2 mg/dL   GFR calc non Af Amer >60 >60 mL/min   GFR calc Af Amer >60 >60 mL/min    Comment: (NOTE) The eGFR has been calculated using the CKD EPI equation. This calculation has not been validated in all clinical situations. eGFR's persistently <60 mL/min signify possible Chronic Kidney Disease.    Anion gap 11 5 - 15  CBC     Status: Abnormal   Collection Time: 10/14/17  5:12 AM  Result Value Ref Range   WBC 11.7 (H) 4.0 - 10.5 K/uL   RBC 4.48 4.22 - 5.81 MIL/uL   Hemoglobin 14.3 13.0 - 17.0 g/dL   HCT 42.5 39.0 - 52.0 %   MCV 94.9 78.0 - 100.0 fL   MCH 31.9 26.0 - 34.0 pg   MCHC 33.6 30.0 - 36.0 g/dL   RDW 12.6 11.5 - 15.5 %   Platelets 173 150 - 400 K/uL  Urinalysis, Routine w reflex microscopic     Status: Abnormal   Collection Time: 10/14/17  5:12 AM  Result Value Ref Range   Color, Urine AMBER (A) YELLOW    Comment: BIOCHEMICALS MAY BE AFFECTED BY COLOR   APPearance CLEAR CLEAR   Specific Gravity, Urine 1.016 1.005 - 1.030   pH 7.0 5.0 - 8.0    Glucose, UA NEGATIVE NEGATIVE mg/dL   Hgb urine dipstick NEGATIVE NEGATIVE   Bilirubin Urine NEGATIVE NEGATIVE   Ketones, ur 5 (A) NEGATIVE mg/dL   Protein, ur NEGATIVE NEGATIVE mg/dL   Nitrite NEGATIVE NEGATIVE   Leukocytes, UA NEGATIVE NEGATIVE  Hepatitis panel, acute     Status: None   Collection Time: 10/14/17  2:04 PM  Result Value Ref Range   Hepatitis B Surface Ag Negative Negative   HCV Ab <0.1 0.0 - 0.9 s/co ratio    Comment: (NOTE)                                  Negative:     < 0.8                             Indeterminate: 0.8 - 0.9                                  Positive:     > 0.9 The CDC recommends that a positive HCV antibody result be followed up with a HCV Nucleic Acid Amplification test (709628). Performed At: [facility] [place], [place] [id number] Rush Farmer MD [id number]    Hep A IgM Negative Negative   Hep B C IgM Negative Negative  Comprehensive metabolic panel     Status: Abnormal   Collection Time: 10/15/17  2:50 AM  Result Value Ref Range   Sodium 135 135 - 145 mmol/L   Potassium 3.4 (L) 3.5 - 5.1 mmol/L   Chloride 103 101 - 111 mmol/L   CO2 22 22 - 32 mmol/L   Glucose, Bld 81 65 - 99 mg/dL   BUN 13 6 - 20 mg/dL   Creatinine, Ser 1.26 (H) 0.61 -  1.24 mg/dL   Calcium 7.4 (L) 8.9 - 10.3 mg/dL   Total Protein 6.0 (L) 6.5 - 8.1 g/dL   Albumin 2.8 (L) 3.5 - 5.0 g/dL   AST 182 (H) 15 - 41 U/L   ALT 197 (H) 17 - 63 U/L   Alkaline Phosphatase 143 (H) 38 - 126 U/L   Total Bilirubin 5.3 (H) 0.3 - 1.2 mg/dL   GFR calc non Af Amer 56 (L) >60 mL/min   GFR calc Af Amer >60 >60 mL/min    Comment: (NOTE) The eGFR has been calculated using the CKD EPI equation. This calculation has not been validated in all clinical situations. eGFR's persistently <60 mL/min signify possible Chronic Kidney Disease.    Anion gap 10 5 - 15  CBC     Status: Abnormal   Collection Time: 10/15/17  2:50 AM  Result Value Ref Range   WBC 8.5 4.0  - 10.5 K/uL   RBC 3.97 (L) 4.22 - 5.81 MIL/uL   Hemoglobin 12.8 (L) 13.0 - 17.0 g/dL   HCT 38.2 (L) 39.0 - 52.0 %   MCV 96.2 78.0 - 100.0 fL   MCH 32.2 26.0 - 34.0 pg   MCHC 33.5 30.0 - 36.0 g/dL   RDW 13.0 11.5 - 15.5 %   Platelets 105 (L) 150 - 400 K/uL    Comment: SPECIMEN CHECKED FOR CLOTS REPEATED TO VERIFY PLATELET COUNT CONFIRMED BY SMEAR   Lipid panel     Status: Abnormal   Collection Time: 10/15/17  2:50 AM  Result Value Ref Range   Cholesterol 88 0 - 200 mg/dL   Triglycerides 72 <150 mg/dL   HDL 24 (L) >40 mg/dL   Total CHOL/HDL Ratio 3.7 RATIO   VLDL 14 0 - 40 mg/dL   LDL Cholesterol 50 0 - 99 mg/dL    Comment:        Total Cholesterol/HDL:CHD Risk Coronary Heart Disease Risk Table                     Men   Women  1/2 Average Risk   3.4   3.3  Average Risk       5.0   4.4  2 X Average Risk   9.6   7.1  3 X Average Risk  23.4   11.0        Use the calculated Patient Ratio above and the CHD Risk Table to determine the patient's CHD Risk.        ATP III CLASSIFICATION (LDL):  <100     mg/dL   Optimal  100-129  mg/dL   Near or Above                    Optimal  130-159  mg/dL   Borderline  160-189  mg/dL   High  >190     mg/dL   Very High   Lactate dehydrogenase     Status: Abnormal   Collection Time: 10/15/17  2:50 AM  Result Value Ref Range   LDH 275 (H) 98 - 192 U/L  Reticulocytes     Status: Abnormal   Collection Time: 10/15/17  2:50 AM  Result Value Ref Range   Retic Ct Pct 1.5 0.4 - 3.1 %   RBC. 3.93 (L) 4.22 - 5.81 MIL/uL   Retic Count, Absolute 59.0 19.0 - 186.0 K/uL  Save smear     Status: None   Collection Time: 10/15/17  2:50 AM  Result  Value Ref Range   Smear Review SMEAR STAINED AND AVAILABLE FOR REVIEW   Blood culture (routine x 2)     Status: None   Collection Time: 10/15/17  6:30 AM  Result Value Ref Range   Specimen Description BLOOD LEFT HAND    Special Requests      BOTTLES DRAWN AEROBIC AND ANAEROBIC Blood Culture results may not  be optimal due to an inadequate volume of blood received in culture bottles   Culture NO GROWTH 5 DAYS    Report Status 10/20/2017 FINAL   Direct antiglobulin test (not at [facility])     Status: None   Collection Time: 10/15/17  6:37 AM  Result Value Ref Range   DAT, complement NEG    DAT, IgG NEG   Blood culture (routine x 2)     Status: None   Collection Time: 10/15/17  6:45 AM  Result Value Ref Range   Specimen Description BLOOD RIGHT HAND    Special Requests      BOTTLES DRAWN AEROBIC AND ANAEROBIC Blood Culture adequate volume   Culture NO GROWTH 5 DAYS    Report Status 10/20/2017 FINAL   Haptoglobin     Status: None   Collection Time: 10/15/17  6:47 AM  Result Value Ref Range   Haptoglobin 102 34 - 200 mg/dL    Comment: (NOTE) Performed At: [facility] [place], [place] [id number] Rush Farmer MD [id number]   Lipase, blood     Status: None   Collection Time: 10/15/17  6:47 AM  Result Value Ref Range   Lipase 17 11 - 51 U/L  Comprehensive metabolic panel     Status: Abnormal   Collection Time: 10/16/17  2:37 AM  Result Value Ref Range   Sodium 135 135 - 145 mmol/L   Potassium 3.1 (L) 3.5 - 5.1 mmol/L   Chloride 109 101 - 111 mmol/L   CO2 19 (L) 22 - 32 mmol/L   Glucose, Bld 90 65 - 99 mg/dL   BUN 13 6 - 20 mg/dL   Creatinine, Ser 1.13 0.61 - 1.24 mg/dL   Calcium 7.2 (L) 8.9 - 10.3 mg/dL   Total Protein 5.8 (L) 6.5 - 8.1 g/dL   Albumin 2.4 (L) 3.5 - 5.0 g/dL   AST 82 (H) 15 - 41 U/L   ALT 112 (H) 17 - 63 U/L   Alkaline Phosphatase 105 38 - 126 U/L   Total Bilirubin 5.0 (H) 0.3 - 1.2 mg/dL   GFR calc non Af Amer >60 >60 mL/min   GFR calc Af Amer >60 >60 mL/min    Comment: (NOTE) The eGFR has been calculated using the CKD EPI equation. This calculation has not been validated in all clinical situations. eGFR's persistently <60 mL/min signify possible Chronic Kidney Disease.    Anion gap 7 5 - 15  CBC     Status: Abnormal   Collection  Time: 10/16/17  2:37 AM  Result Value Ref Range   WBC 6.6 4.0 - 10.5 K/uL   RBC 3.51 (L) 4.22 - 5.81 MIL/uL   Hemoglobin 11.4 (L) 13.0 - 17.0 g/dL   HCT 33.4 (L) 39.0 - 52.0 %   MCV 95.2 78.0 - 100.0 fL   MCH 32.5 26.0 - 34.0 pg   MCHC 34.1 30.0 - 36.0 g/dL   RDW 13.2 11.5 - 15.5 %   Platelets 79 (L) 150 - 400 K/uL    Comment: CONSISTENT WITH PREVIOUS RESULT  Comprehensive metabolic panel  Status: Abnormal   Collection Time: 10/17/17  3:33 AM  Result Value Ref Range   Sodium 135 135 - 145 mmol/L   Potassium 3.5 3.5 - 5.1 mmol/L   Chloride 107 101 - 111 mmol/L   CO2 21 (L) 22 - 32 mmol/L   Glucose, Bld 135 (H) 65 - 99 mg/dL   BUN 9 6 - 20 mg/dL   Creatinine, Ser 1.11 0.61 - 1.24 mg/dL   Calcium 7.6 (L) 8.9 - 10.3 mg/dL   Total Protein 5.7 (L) 6.5 - 8.1 g/dL   Albumin 2.2 (L) 3.5 - 5.0 g/dL   AST 50 (H) 15 - 41 U/L   ALT 78 (H) 17 - 63 U/L   Alkaline Phosphatase 113 38 - 126 U/L   Total Bilirubin 6.8 (H) 0.3 - 1.2 mg/dL   GFR calc non Af Amer >60 >60 mL/min   GFR calc Af Amer >60 >60 mL/min    Comment: (NOTE) The eGFR has been calculated using the CKD EPI equation. This calculation has not been validated in all clinical situations. eGFR's persistently <60 mL/min signify possible Chronic Kidney Disease.    Anion gap 7 5 - 15  Protime-INR     Status: None   Collection Time: 10/17/17  3:33 AM  Result Value Ref Range   Prothrombin Time 14.1 11.4 - 15.2 seconds   INR 1.09   CMV IgM     Status: None   Collection Time: 10/17/17  3:33 AM  Result Value Ref Range   CMV IgM <30.0 0.0 - 29.9 AU/mL    Comment: (NOTE)                                Negative         <30.0                                Equivocal  30.0 - 34.9                                Positive         >34.9 A positive result is generally indicative of acute infection, reactivation or persistent IgM production. Performed At: [facility] [place], [place] [id number] Rush Farmer MD  [id number]   C-reactive protein     Status: Abnormal   Collection Time: 10/17/17  3:33 AM  Result Value Ref Range   CRP 22.9 (H) <1.0 mg/dL  Sedimentation rate     Status: Abnormal   Collection Time: 10/17/17  3:33 AM  Result Value Ref Range   Sed Rate 94 (H) 0 - 16 mm/hr  Lactate dehydrogenase     Status: Abnormal   Collection Time: 10/17/17  3:33 AM  Result Value Ref Range   LDH 211 (H) 98 - 192 U/L  Gamma GT     Status: Abnormal   Collection Time: 10/17/17  3:33 AM  Result Value Ref Range   GGT 276 (H) 7 - 50 U/L  Differential     Status: None   Collection Time: 10/17/17  3:33 AM  Result Value Ref Range   Neutrophils Relative % 84 %   Neutro Abs 4.6 1.7 - 7.7 K/uL   Lymphocytes Relative 12 %   Lymphs Abs 0.7 0.7 - 4.0 K/uL   Monocytes  Relative 4 %   Monocytes Absolute 0.2 0.1 - 1.0 K/uL   Eosinophils Relative 0 %   Eosinophils Absolute 0.0 0.0 - 0.7 K/uL   Basophils Relative 0 %   Basophils Absolute 0.0 0.0 - 0.1 K/uL  CBC     Status: Abnormal   Collection Time: 10/17/17  3:33 AM  Result Value Ref Range   WBC 5.6 4.0 - 10.5 K/uL   RBC 3.62 (L) 4.22 - 5.81 MIL/uL   Hemoglobin 11.8 (L) 13.0 - 17.0 g/dL   HCT 33.6 (L) 39.0 - 52.0 %   MCV 92.8 78.0 - 100.0 fL   MCH 32.6 26.0 - 34.0 pg   MCHC 35.1 30.0 - 36.0 g/dL   RDW 13.0 11.5 - 15.5 %   Platelets 91 (L) 150 - 400 K/uL    Comment: CONSISTENT WITH PREVIOUS RESULT  Culture, blood (routine x 2)     Status: None   Collection Time: 10/17/17  4:25 AM  Result Value Ref Range   Specimen Description BLOOD LEFT ANTECUBITAL    Special Requests      BOTTLES DRAWN AEROBIC AND ANAEROBIC Blood Culture adequate volume   Culture NO GROWTH 5 DAYS    Report Status 10/22/2017 FINAL   Culture, blood (routine x 2)     Status: None   Collection Time: 10/17/17  4:25 AM  Result Value Ref Range   Specimen Description BLOOD LEFT HAND    Special Requests      BOTTLES DRAWN AEROBIC ONLY Blood Culture adequate volume   Culture NO  GROWTH 5 DAYS    Report Status 10/22/2017 FINAL   Culture, expectorated sputum-assessment     Status: None   Collection Time: 10/17/17 10:50 AM  Result Value Ref Range   Specimen Description SPUTUM    Special Requests NONE    Sputum evaluation      Sputum specimen not acceptable for testing.  Please recollect.   Gram Stain Report Called to,Read Back By and Verified With: A FOWLER,RN AT [id number] 10/17/17 BY L BENFIELD    Report Status 10/17/2017 FINAL   HIV antibody     Status: None   Collection Time: 10/17/17 11:35 AM  Result Value Ref Range   HIV Screen 4th Generation wRfx Non Reactive Non Reactive    Comment: (NOTE) Performed At: [facility] [place], [place] [id number] Rush Farmer MD [id number]   Lactic acid, plasma     Status: None   Collection Time: 10/17/17  3:07 PM  Result Value Ref Range   Lactic Acid, Venous 1.4 0.5 - 1.9 mmol/L  Blood gas, arterial Once     Status: Abnormal   Collection Time: 10/17/17  4:13 PM  Result Value Ref Range   O2 Content 2.0 L/min   Delivery systems NASAL CANNULA    pH, Arterial 7.482 (H) 7.350 - 7.450   pCO2 arterial 27.4 (L) 32.0 - 48.0 mmHg   pO2, Arterial 75.5 (L) 83.0 - 108.0 mmHg   Bicarbonate 20.3 20.0 - 28.0 mmol/L   Acid-base deficit 2.7 (H) 0.0 - 2.0 mmol/L   O2 Saturation 96.4 %   Patient temperature 98.6    Collection site RIGHT RADIAL    Drawn by [id number]    Sample type ARTERIAL DRAW    Allens test (pass/fail) PASS PASS  Legionella Pneumophila Serogp 1 Ur Ag     Status: None   Collection Time: 10/17/17  6:36 PM  Result Value Ref Range   L. pneumophila Serogp 1  Ur Ag Negative Negative    Comment: (NOTE) Presumptive negative for L. pneumophila serogroup 1 antigen in urine, suggesting no recent or current infection. Legionnaires' disease cannot be ruled out since other serogroups and species may also cause disease. Performed At: [facility] [place], [place]  [id number] Rush Farmer MD [id number]    Source of Sample URINE, RANDOM   Magnesium     Status: None   Collection Time: 10/17/17  8:36 PM  Result Value Ref Range   Magnesium 1.8 1.7 - 2.4 mg/dL  Gastrointestinal Panel by PCR , Stool     Status: Abnormal   Collection Time: 10/17/17  8:47 PM  Result Value Ref Range   Campylobacter species NOT DETECTED NOT DETECTED   Plesimonas shigelloides NOT DETECTED NOT DETECTED   Salmonella species NOT DETECTED NOT DETECTED   Yersinia enterocolitica NOT DETECTED NOT DETECTED   Vibrio species NOT DETECTED NOT DETECTED   Vibrio cholerae NOT DETECTED NOT DETECTED   Enteroaggregative E coli (EAEC) NOT DETECTED NOT DETECTED   Enteropathogenic E coli (EPEC) DETECTED (A) NOT DETECTED    Comment: RESULT CALLED TO, READ BACK BY AND VERIFIED WITH: [facility] JONES 10/18/17 @ 1813 MLK    Enterotoxigenic E coli (ETEC) NOT DETECTED NOT DETECTED   Shiga like toxin producing E coli (STEC) NOT DETECTED NOT DETECTED   Shigella/Enteroinvasive E coli (EIEC) NOT DETECTED NOT DETECTED   Cryptosporidium NOT DETECTED NOT DETECTED   Cyclospora cayetanensis NOT DETECTED NOT DETECTED   Entamoeba histolytica NOT DETECTED NOT DETECTED   Giardia lamblia NOT DETECTED NOT DETECTED   Adenovirus F40/41 NOT DETECTED NOT DETECTED   Astrovirus NOT DETECTED NOT DETECTED   Norovirus GI/GII NOT DETECTED NOT DETECTED   Rotavirus A NOT DETECTED NOT DETECTED   Sapovirus (I, II, IV, and V) NOT DETECTED NOT DETECTED    Comment: Performed at [facility], [address] [place], [place] [address]  Respiratory Panel by PCR     Status: None   Collection Time: 10/17/17  8:47 PM  Result Value Ref Range   Adenovirus NOT DETECTED NOT DETECTED   Coronavirus 229E NOT DETECTED NOT DETECTED   Coronavirus HKU1 NOT DETECTED NOT DETECTED   Coronavirus NL63 NOT DETECTED NOT DETECTED   Coronavirus OC43 NOT DETECTED NOT DETECTED   Metapneumovirus NOT DETECTED NOT DETECTED   Rhinovirus /  Enterovirus NOT DETECTED NOT DETECTED   Influenza A NOT DETECTED NOT DETECTED   Influenza B NOT DETECTED NOT DETECTED   Parainfluenza Virus 1 NOT DETECTED NOT DETECTED   Parainfluenza Virus 2 NOT DETECTED NOT DETECTED   Parainfluenza Virus 3 NOT DETECTED NOT DETECTED   Parainfluenza Virus 4 NOT DETECTED NOT DETECTED   Respiratory Syncytial Virus NOT DETECTED NOT DETECTED   Bordetella pertussis NOT DETECTED NOT DETECTED   Chlamydophila pneumoniae NOT DETECTED NOT DETECTED   Mycoplasma pneumoniae NOT DETECTED NOT DETECTED  Culture, expectorated sputum-assessment     Status: None   Collection Time: 10/17/17  9:45 PM  Result Value Ref Range   Specimen Description EXPECTORATED SPUTUM    Special Requests NONE    Sputum evaluation THIS SPECIMEN IS ACCEPTABLE FOR SPUTUM CULTURE    Report Status 10/18/2017 FINAL   Acid Fast Smear (AFB)     Status: None   Collection Time: 10/17/17  9:45 PM  Result Value Ref Range   AFB Specimen Processing Concentration    Acid Fast Smear Negative     Comment: (NOTE) Performed At: [address]  [place], [place] [id number] Rush Farmer MD [id number]    Source (AFB) EXPECTORATED SPUTUM   Culture, respiratory (NON-Expectorated)     Status: None   Collection Time: 10/17/17  9:45 PM  Result Value Ref Range   Specimen Description EXPECTORATED SPUTUM    Special Requests NONE Reflexed from [id number]    Gram Stain      MODERATE WBC PRESENT, PREDOMINANTLY MONONUCLEAR FEW SQUAMOUS EPITHELIAL CELLS PRESENT FEW BUDDING YEAST SEEN RARE GRAM POSITIVE COCCI IN PAIRS    Culture FEW CANDIDA TROPICALIS    Report Status 10/20/2017 FINAL   Basic metabolic panel     Status: Abnormal   Collection Time: 10/18/17  2:07 AM  Result Value Ref Range   Sodium 135 135 - 145 mmol/L   Potassium 3.9 3.5 - 5.1 mmol/L   Chloride 109 101 - 111 mmol/L   CO2 20 (L) 22 - 32 mmol/L   Glucose, Bld 129 (H) 65 - 99 mg/dL   BUN 6 6 - 20 mg/dL   Creatinine, Ser  0.99 0.61 - 1.24 mg/dL   Calcium 7.8 (L) 8.9 - 10.3 mg/dL   GFR calc non Af Amer >60 >60 mL/min   GFR calc Af Amer >60 >60 mL/min    Comment: (NOTE) The eGFR has been calculated using the CKD EPI equation. This calculation has not been validated in all clinical situations. eGFR's persistently <60 mL/min signify possible Chronic Kidney Disease.    Anion gap 6 5 - 15  Hepatic function panel     Status: Abnormal   Collection Time: 10/18/17  2:07 AM  Result Value Ref Range   Total Protein 5.6 (L) 6.5 - 8.1 g/dL   Albumin 2.0 (L) 3.5 - 5.0 g/dL   AST 50 (H) 15 - 41 U/L   ALT 58 17 - 63 U/L   Alkaline Phosphatase 106 38 - 126 U/L   Total Bilirubin 8.3 (H) 0.3 - 1.2 mg/dL   Bilirubin, Direct 5.4 (H) 0.1 - 0.5 mg/dL   Indirect Bilirubin 2.9 (H) 0.3 - 0.9 mg/dL  CBC     Status: Abnormal   Collection Time: 10/18/17  2:07 AM  Result Value Ref Range   WBC 8.1 4.0 - 10.5 K/uL   RBC 3.89 (L) 4.22 - 5.81 MIL/uL   Hemoglobin 12.4 (L) 13.0 - 17.0 g/dL   HCT 35.4 (L) 39.0 - 52.0 %   MCV 91.0 78.0 - 100.0 fL   MCH 31.9 26.0 - 34.0 pg   MCHC 35.0 30.0 - 36.0 g/dL   RDW 12.9 11.5 - 15.5 %   Platelets 107 (L) 150 - 400 K/uL    Comment: CONSISTENT WITH PREVIOUS RESULT  Angiotensin converting enzyme     Status: None   Collection Time: 10/18/17  2:07 AM  Result Value Ref Range   Angiotensin-Converting Enzyme 49 14 - 82 U/L    Comment: (NOTE) Performed At: [facility] [place], [place] [id number] Rush Farmer MD [id number]   IgE     Status: Abnormal   Collection Time: 10/18/17  2:07 AM  Result Value Ref Range   IgE (Immunoglobulin E), Serum 678 (H) 0 - 100 IU/mL    Comment: (NOTE) Performed At: [facility] [place], [place] [id number] Rush Farmer MD [id number]   Comprehensive metabolic panel     Status: Abnormal   Collection Time: 10/19/17  5:31 AM  Result Value Ref Range   Sodium 135 135 - 145 mmol/L   Potassium 4.7  3.5 - 5.1  mmol/L    Comment: NO VISIBLE HEMOLYSIS   Chloride 107 101 - 111 mmol/L   CO2 20 (L) 22 - 32 mmol/L   Glucose, Bld 119 (H) 65 - 99 mg/dL   BUN 6 6 - 20 mg/dL   Creatinine, Ser 0.93 0.61 - 1.24 mg/dL   Calcium 8.2 (L) 8.9 - 10.3 mg/dL   Total Protein 6.1 (L) 6.5 - 8.1 g/dL   Albumin 2.1 (L) 3.5 - 5.0 g/dL   AST 53 (H) 15 - 41 U/L   ALT 49 17 - 63 U/L   Alkaline Phosphatase 122 38 - 126 U/L   Total Bilirubin 11.2 (H) 0.3 - 1.2 mg/dL   GFR calc non Af Amer >60 >60 mL/min   GFR calc Af Amer >60 >60 mL/min    Comment: (NOTE) The eGFR has been calculated using the CKD EPI equation. This calculation has not been validated in all clinical situations. eGFR's persistently <60 mL/min signify possible Chronic Kidney Disease.    Anion gap 8 5 - 15  CBC     Status: Abnormal   Collection Time: 10/19/17  5:31 AM  Result Value Ref Range   WBC 10.5 4.0 - 10.5 K/uL   RBC 3.92 (L) 4.22 - 5.81 MIL/uL   Hemoglobin 12.6 (L) 13.0 - 17.0 g/dL   HCT 35.5 (L) 39.0 - 52.0 %   MCV 90.6 78.0 - 100.0 fL   MCH 32.1 26.0 - 34.0 pg   MCHC 35.5 30.0 - 36.0 g/dL   RDW 13.1 11.5 - 15.5 %   Platelets 130 (L) 150 - 400 K/uL  Bilirubin, fractionated(tot/dir/indir)     Status: Abnormal   Collection Time: 10/19/17  9:07 AM  Result Value Ref Range   Total Bilirubin 12.2 (H) 0.3 - 1.2 mg/dL   Bilirubin, Direct 7.9 (H) 0.1 - 0.5 mg/dL   Indirect Bilirubin 4.3 (H) 0.3 - 0.9 mg/dL  Lipase, blood     Status: None   Collection Time: 10/19/17  9:07 AM  Result Value Ref Range   Lipase 41 11 - 51 U/L  Mitochondrial antibodies     Status: Abnormal   Collection Time: 10/19/17  9:07 AM  Result Value Ref Range   Mitochondrial M2 Ab, IgG 29.1 (H) 0.0 - 20.0 Units    Comment: (NOTE)                                Negative    0.0 - 20.0                                Equivocal  20.1 - 24.9                                Positive         >24.9 Mitochondrial (M2) Antibodies are found in 90-96% of patients with primary  biliary cirrhosis. Performed At: [facility] [place], [place] [id number] Rush Farmer MD [id number]   Troponin I     Status: Abnormal   Collection Time: 10/19/17  4:35 PM  Result Value Ref Range   Troponin I 0.03 (HH) <0.03 ng/mL    Comment: CRITICAL RESULT CALLED TO, READ BACK BY AND VERIFIED WITH: Louanne Belton, RN [id number] 10/19/17 THOMPSON V   Urinalysis, Complete  w Microscopic     Status: Abnormal   Collection Time: 10/19/17  5:33 PM  Result Value Ref Range   Color, Urine AMBER (A) YELLOW    Comment: BIOCHEMICALS MAY BE AFFECTED BY COLOR   APPearance CLEAR CLEAR   Specific Gravity, Urine 1.020 1.005 - 1.030   pH 6.0 5.0 - 8.0   Glucose, UA NEGATIVE NEGATIVE mg/dL   Hgb urine dipstick SMALL (A) NEGATIVE   Bilirubin Urine MODERATE (A) NEGATIVE   Ketones, ur NEGATIVE NEGATIVE mg/dL   Protein, ur 30 (A) NEGATIVE mg/dL   Nitrite NEGATIVE NEGATIVE   Leukocytes, UA NEGATIVE NEGATIVE   RBC / HPF 0-5 0 - 5 RBC/hpf   WBC, UA 0-5 0 - 5 WBC/hpf   Bacteria, UA RARE (A) NONE SEEN   Squamous Epithelial / LPF NONE SEEN NONE SEEN   Mucus PRESENT   Blood gas, arterial     Status: Abnormal   Collection Time: 10/19/17  6:45 PM  Result Value Ref Range   FIO2 50.00    Delivery systems VENTURI MASK    pH, Arterial 7.443 7.350 - 7.450   pCO2 arterial 28.6 (L) 32.0 - 48.0 mmHg   pO2, Arterial 88.9 83.0 - 108.0 mmHg   Bicarbonate 18.9 (L) 20.0 - 28.0 mmol/L   Acid-base deficit 4.2 (H) 0.0 - 2.0 mmol/L   O2 Saturation 96.8 %   Patient temperature 100.9    Collection site LEFT RADIAL    Drawn by [id number]    Sample type ARTERIAL DRAW    Allens test (pass/fail) PASS PASS  Culture, blood (Routine X 2) w Reflex to ID Panel     Status: None (Preliminary result)   Collection Time: 10/19/17  7:27 PM  Result Value Ref Range   Specimen Description BLOOD LEFT ANTECUBITAL    Special Requests      BOTTLES DRAWN AEROBIC AND ANAEROBIC Blood Culture adequate volume   Culture NO  GROWTH 3 DAYS    Report Status PENDING   Culture, blood (Routine X 2) w Reflex to ID Panel     Status: None (Preliminary result)   Collection Time: 10/19/17  7:29 PM  Result Value Ref Range   Specimen Description BLOOD BLOOD RIGHT HAND    Special Requests IN PEDIATRIC BOTTLE Blood Culture adequate volume    Culture NO GROWTH 3 DAYS    Report Status PENDING   CBC with Differential/Platelet     Status: Abnormal   Collection Time: 10/20/17  3:10 AM  Result Value Ref Range   WBC 12.1 (H) 4.0 - 10.5 K/uL   RBC 3.67 (L) 4.22 - 5.81 MIL/uL   Hemoglobin 11.9 (L) 13.0 - 17.0 g/dL   HCT 33.4 (L) 39.0 - 52.0 %   MCV 91.0 78.0 - 100.0 fL   MCH 32.4 26.0 - 34.0 pg   MCHC 35.6 30.0 - 36.0 g/dL   RDW 13.3 11.5 - 15.5 %   Platelets 172 150 - 400 K/uL   Neutrophils Relative % 78 %   Lymphocytes Relative 13 %   Monocytes Relative 7 %   Eosinophils Relative 2 %   Basophils Relative 0 %   Neutro Abs 9.5 (H) 1.7 - 7.7 K/uL   Lymphs Abs 1.6 0.7 - 4.0 K/uL   Monocytes Absolute 0.8 0.1 - 1.0 K/uL   Eosinophils Absolute 0.2 0.0 - 0.7 K/uL   Basophils Absolute 0.0 0.0 - 0.1 K/uL   RBC Morphology POLYCHROMASIA PRESENT     Comment: RARE NRBCs OVAL MACROCYTES  Basic metabolic panel     Status: Abnormal   Collection Time: 10/20/17  3:10 AM  Result Value Ref Range   Sodium 133 (L) 135 - 145 mmol/L   Potassium 4.2 3.5 - 5.1 mmol/L   Chloride 106 101 - 111 mmol/L   CO2 18 (L) 22 - 32 mmol/L   Glucose, Bld 124 (H) 65 - 99 mg/dL   BUN 9 6 - 20 mg/dL   Creatinine, Ser 0.87 0.61 - 1.24 mg/dL   Calcium 8.0 (L) 8.9 - 10.3 mg/dL   GFR calc non Af Amer >60 >60 mL/min   GFR calc Af Amer >60 >60 mL/min    Comment: (NOTE) The eGFR has been calculated using the CKD EPI equation. This calculation has not been validated in all clinical situations. eGFR's persistently <60 mL/min signify possible Chronic Kidney Disease.    Anion gap 9 5 - 15  Hepatic function panel     Status: Abnormal   Collection Time:  10/20/17  3:10 AM  Result Value Ref Range   Total Protein 6.0 (L) 6.5 - 8.1 g/dL   Albumin 1.9 (L) 3.5 - 5.0 g/dL   AST 62 (H) 15 - 41 U/L   ALT 46 17 - 63 U/L   Alkaline Phosphatase 131 (H) 38 - 126 U/L   Total Bilirubin 14.4 (H) 0.3 - 1.2 mg/dL   Bilirubin, Direct 9.3 (H) 0.1 - 0.5 mg/dL   Indirect Bilirubin 5.1 (H) 0.3 - 0.9 mg/dL  Ferritin     Status: Abnormal   Collection Time: 10/20/17  3:10 AM  Result Value Ref Range   Ferritin 1,118 (H) 24 - 336 ng/mL  ANA w/Reflex if Positive     Status: None   Collection Time: 10/20/17  3:10 AM  Result Value Ref Range   Anit Nuclear Antibody(ANA) Negative Negative    Comment: (NOTE) Performed At: [facility] [place], [place] [id number] Rush Farmer MD [id number]   C-reactive protein     Status: Abnormal   Collection Time: 10/20/17  3:10 AM  Result Value Ref Range   CRP 26.3 (H) <1.0 mg/dL  Lactate dehydrogenase     Status: Abnormal   Collection Time: 10/20/17  3:10 AM  Result Value Ref Range   LDH 440 (H) 98 - 192 U/L  Epstein-Barr virus VCA antibody panel     Status: Abnormal   Collection Time: 10/20/17  3:10 AM  Result Value Ref Range   EBV VCA IgG 373.0 (H) 0.0 - 17.9 U/mL    Comment: (NOTE)                                 Negative        <18.0                                 Equivocal 18.0 - 21.9                                 Positive        >21.9    EBV VCA IgM <36.0 0.0 - 35.9 U/mL    Comment: (NOTE)  Negative        <36.0                                 Equivocal 36.0 - 43.9                                 Positive        >43.9    EBV NA IgG 355.0 (H) 0.0 - 17.9 U/mL    Comment: (NOTE)                                 Negative        <18.0                                 Equivocal 18.0 - 21.9                                 Positive        >21.9    EBV Early Antigen Ab, IgG <9.0 0.0 - 8.9 U/mL    Comment: (NOTE)                                 Negative         < 9.0                                 Equivocal  9.0 - 10.9                                 Positive        >10.9    Interpretation: Comment     Comment: (NOTE)               EBV Interpretation Chart Interpretation   EBV-IgM  EA(D)-IgG  VCA-IgG  EBNA-IgG EBV Seronegative    -        -         -          - Early Phase         +        -         -          - Acute Primary       +       +or-       +          - Infection Convalescence/Past  -       +or-       +          + Infection Reactivated        +or-      +         +          + Infection       + Antibody Present      - Antibody Absent Performed At: [facility] [place], [place] [id number] Rush Farmer MD [id number]  Pathologist smear review     Status: None   Collection Time: 10/20/17  3:10 AM  Result Value Ref Range   Path Review Leukocytosis with predominance of neutrophils.     Comment: Normocytic anemia. Reviewed by Marlynn Perking. Melina Copa, M.D. 10/21/2017   Blood gas, arterial     Status: Abnormal   Collection Time: 10/20/17 11:28 AM  Result Value Ref Range   FIO2 40.00    Delivery systems BILEVEL POSITIVE AIRWAY PRESSURE    Mode BILEVEL POSITIVE AIRWAY PRESSURE    LHR 8.0 resp/min   Inspiratory PAP 12    Expiratory PAP 6    pH, Arterial 7.463 (H) 7.350 - 7.450   pCO2 arterial 26.7 (L) 32.0 - 48.0 mmHg   pO2, Arterial 90.1 83.0 - 108.0 mmHg   Bicarbonate 18.9 (L) 20.0 - 28.0 mmol/L   Acid-base deficit 4.3 (H) 0.0 - 2.0 mmol/L   O2 Saturation 97.4 %   Patient temperature 98.6    Collection site LEFT RADIAL    Drawn by [id number]    Sample type ARTERIAL DRAW    Allens test (pass/fail) PASS PASS  Comprehensive metabolic panel     Status: Abnormal   Collection Time: 10/21/17 12:45 PM  Result Value Ref Range   Sodium 130 (L) 135 - 145 mmol/L   Potassium 3.9 3.5 - 5.1 mmol/L   Chloride 103 101 - 111 mmol/L   CO2 17 (L) 22 - 32 mmol/L   Glucose, Bld 126 (H) 65 - 99 mg/dL   BUN 11 6 - 20  mg/dL   Creatinine, Ser 0.85 0.61 - 1.24 mg/dL   Calcium 7.5 (L) 8.9 - 10.3 mg/dL   Total Protein 5.8 (L) 6.5 - 8.1 g/dL   Albumin 1.8 (L) 3.5 - 5.0 g/dL   AST 92 (H) 15 - 41 U/L   ALT 52 17 - 63 U/L   Alkaline Phosphatase 209 (H) 38 - 126 U/L   Total Bilirubin 19.4 (HH) 0.3 - 1.2 mg/dL    Comment: CRITICAL RESULT CALLED TO, READ BACK BY AND VERIFIED WITH: J,WALKER,RN 10/21/17 1357 BY BSLADE    GFR calc non Af Amer >60 >60 mL/min   GFR calc Af Amer >60 >60 mL/min    Comment: (NOTE) The eGFR has been calculated using the CKD EPI equation. This calculation has not been validated in all clinical situations. eGFR's persistently <60 mL/min signify possible Chronic Kidney Disease.    Anion gap 10 5 - 15  CBC     Status: Abnormal   Collection Time: 10/21/17 12:45 PM  Result Value Ref Range   WBC 14.7 (H) 4.0 - 10.5 K/uL   RBC 3.32 (L) 4.22 - 5.81 MIL/uL   Hemoglobin 10.6 (L) 13.0 - 17.0 g/dL   HCT 30.2 (L) 39.0 - 52.0 %   MCV 91.0 78.0 - 100.0 fL   MCH 31.9 26.0 - 34.0 pg   MCHC 35.1 30.0 - 36.0 g/dL   RDW 13.5 11.5 - 15.5 %   Platelets 228 150 - 400 K/uL  Bilirubin, fractionated(tot/dir/indir)     Status: Abnormal   Collection Time: 10/21/17  1:00 PM  Result Value Ref Range   Total Bilirubin 19.4 (HH) 0.3 - 1.2 mg/dL    Comment: CRITICAL VALUE NOTED.  VALUE IS CONSISTENT WITH PREVIOUSLY REPORTED AND CALLED VALUE.   Bilirubin, Direct 13.8 (H) 0.1 - 0.5 mg/dL    Comment: RESULTS CONFIRMED BY MANUAL DILUTION   Indirect Bilirubin 5.6 (H) 0.3 - 0.9 mg/dL  MRSA  PCR Screening     Status: None   Collection Time: 10/21/17  5:14 PM  Result Value Ref Range   MRSA by PCR NEGATIVE NEGATIVE    Comment:        The GeneXpert MRSA Assay (FDA approved for NASAL specimens only), is one component of a comprehensive MRSA colonization surveillance program. It is not intended to diagnose MRSA infection nor to guide or monitor treatment for MRSA infections.   Prealbumin     Status: Abnormal     Collection Time: 10/22/17  4:28 AM  Result Value Ref Range   Prealbumin <5 (L) 18 - 38 mg/dL  Magnesium     Status: None   Collection Time: 10/22/17  4:28 AM  Result Value Ref Range   Magnesium 2.3 1.7 - 2.4 mg/dL  Phosphorus     Status: None   Collection Time: 10/22/17  4:28 AM  Result Value Ref Range   Phosphorus 4.0 2.5 - 4.6 mg/dL  Triglycerides     Status: Abnormal   Collection Time: 10/22/17  4:28 AM  Result Value Ref Range   Triglycerides 348 (H) <150 mg/dL  CBC     Status: Abnormal   Collection Time: 10/22/17  4:28 AM  Result Value Ref Range   WBC 16.8 (H) 4.0 - 10.5 K/uL   RBC 3.45 (L) 4.22 - 5.81 MIL/uL   Hemoglobin 11.0 (L) 13.0 - 17.0 g/dL   HCT 31.6 (L) 39.0 - 52.0 %   MCV 91.6 78.0 - 100.0 fL   MCH 31.9 26.0 - 34.0 pg   MCHC 34.8 30.0 - 36.0 g/dL   RDW 13.9 11.5 - 15.5 %   Platelets 277 150 - 400 K/uL  Differential     Status: Abnormal   Collection Time: 10/22/17  4:28 AM  Result Value Ref Range   Neutrophils Relative % 85 %   Lymphocytes Relative 9 %   Monocytes Relative 4 %   Eosinophils Relative 2 %   Basophils Relative 0 %   Neutro Abs 14.3 (H) 1.7 - 7.7 K/uL   Lymphs Abs 1.5 0.7 - 4.0 K/uL   Monocytes Absolute 0.7 0.1 - 1.0 K/uL   Eosinophils Absolute 0.3 0.0 - 0.7 K/uL   Basophils Absolute 0.0 0.0 - 0.1 K/uL   RBC Morphology POLYCHROMASIA PRESENT    WBC Morphology TOXIC GRANULATION   Hepatic function panel     Status: Abnormal   Collection Time: 10/22/17  4:28 AM  Result Value Ref Range   Total Protein 6.3 (L) 6.5 - 8.1 g/dL   Albumin 1.8 (L) 3.5 - 5.0 g/dL   AST 109 (H) 15 - 41 U/L   ALT 58 17 - 63 U/L   Alkaline Phosphatase 278 (H) 38 - 126 U/L   Total Bilirubin 21.8 (HH) 0.3 - 1.2 mg/dL    Comment: CRITICAL VALUE NOTED.  VALUE IS CONSISTENT WITH PREVIOUSLY REPORTED AND CALLED VALUE.   Bilirubin, Direct 14.8 (H) 0.1 - 0.5 mg/dL    Comment: RESULTS CONFIRMED BY MANUAL DILUTION   Indirect Bilirubin 7.0 (H) 0.3 - 0.9 mg/dL  Basic metabolic  panel     Status: Abnormal   Collection Time: 10/22/17  4:28 AM  Result Value Ref Range   Sodium 133 (L) 135 - 145 mmol/L   Potassium 4.0 3.5 - 5.1 mmol/L   Chloride 103 101 - 111 mmol/L   CO2 18 (L) 22 - 32 mmol/L   Glucose, Bld 95 65 - 99 mg/dL   BUN 12  6 - 20 mg/dL   Creatinine, Ser 0.94 0.61 - 1.24 mg/dL   Calcium 7.9 (L) 8.9 - 10.3 mg/dL   GFR calc non Af Amer >60 >60 mL/min   GFR calc Af Amer >60 >60 mL/min    Comment: (NOTE) The eGFR has been calculated using the CKD EPI equation. This calculation has not been validated in all clinical situations. eGFR's persistently <60 mL/min signify possible Chronic Kidney Disease.    Anion gap 12 5 - 15  Gamma GT     Status: Abnormal   Collection Time: 10/22/17  4:28 AM  Result Value Ref Range   GGT 288 (H) 7 - 50 U/L  Lactate dehydrogenase     Status: Abnormal   Collection Time: 10/22/17  4:28 AM  Result Value Ref Range   LDH 420 (H) 98 - 192 U/L  Protime-INR     Status: Abnormal   Collection Time: 10/22/17  4:28 AM  Result Value Ref Range   Prothrombin Time 16.0 (H) 11.4 - 15.2 seconds   INR 1.29   Procalcitonin - Baseline     Status: None   Collection Time: 10/22/17  4:28 AM  Result Value Ref Range   Procalcitonin 0.84 ng/mL    Comment:        Interpretation: PCT > 0.5 ng/mL and <= 2 ng/mL: Systemic infection (sepsis) is possible, but other conditions are known to elevate PCT as well. (NOTE)       Sepsis PCT Algorithm           Lower Respiratory Tract                                      Infection PCT Algorithm    ----------------------------     ----------------------------         PCT < 0.25 ng/mL                PCT < 0.10 ng/mL         Strongly encourage             Strongly discourage   discontinuation of antibiotics    initiation of antibiotics    ----------------------------     -----------------------------       PCT 0.25 - 0.50 ng/mL            PCT 0.10 - 0.25 ng/mL               OR       >80% decrease in PCT             Discourage initiation of                                            antibiotics      Encourage discontinuation           of antibiotics    ----------------------------     -----------------------------         PCT >= 0.50 ng/mL              PCT 0.26 - 0.50 ng/mL                AND       <80% decrease in PCT             Encourage  initiation of                                             antibiotics       Encourage continuation           of antibiotics    ----------------------------     -----------------------------        PCT >= 0.50 ng/mL                  PCT > 0.50 ng/mL               AND         increase in PCT                  Strongly encourage                                      initiation of antibiotics    Strongly encourage escalation           of antibiotics                                     -----------------------------                                           PCT <= 0.25 ng/mL                                                 OR                                        > 80% decrease in PCT                                     Discontinue / Do not initiate                                             antibiotics Performed at [facility] [address], [place], [place] [address]   Lipase, blood     Status: None   Collection Time: 10/22/17  4:28 AM  Result Value Ref Range   Lipase 40 11 - 51 U/L  Ammonia     Status: Abnormal   Collection Time: 10/22/17 10:53 AM  Result Value Ref Range   Ammonia 53 (H) 9 - 35 umol/L  Glucose, capillary     Status: None   Collection Time: 10/22/17  6:01 PM  Result Value Ref Range   Glucose-Capillary 89 65 - 99 mg/dL   CT Abdomen 1/07  1.  No acute process in the abdomen or pelvis. 2.  Aortic Atherosclerosis (ICD10-I70.0). 3. Chronic T12  and new L4 compression deformities. 4. Left nephrolithiasis.  RUQ Ultrasound 1/07 Gallbladder is contracted and not well visualized. No visible stones or wall  thickening. No biliary duct dilatation.  CT Abdomen and Pelvis with Contrast 1/10  1. Enlarged left lobe of liver and mild heterogeneity may represent cirrhosis. 2. Left kidney nonobstructive nephrolithiasis. 3. Aortic atherosclerosis. 4. Stable T12, L2, and L4 compression deformities.  CT Chest with Contrast 1/10 1. Large ground-glass consolidations within the perihilar and upper lobe regions bilaterally, new compared to chest CT of 08/10/2016, corresponding to the bilateral lung opacities seen on today's chest x-ray, most likely atypical pneumonia and/or pulmonary edema. Differential includes atypical pneumonias such as viral or fungal, interstitial pneumonias, edema related to volume overload/CHF, chronic interstitial diseases, hypersensitivity pneumonitis, and respiratory bronchiolitis. 2. New mild lymphadenopathy within the right hilum, likely reactive. 3. Small bilateral pleural effusions.  Discharge Instructions:   Signed: Ina Homes, MD 10/22/2017, 6:09 PM   Pager: My Pager: [phone]

## 2017-10-22 DIAGNOSIS — R17 Unspecified jaundice: Secondary | ICD-10-CM

## 2017-10-22 DIAGNOSIS — Z9981 Dependence on supplemental oxygen: Secondary | ICD-10-CM | POA: Diagnosis not present

## 2017-10-22 DIAGNOSIS — Z720 Tobacco use: Secondary | ICD-10-CM | POA: Diagnosis not present

## 2017-10-22 DIAGNOSIS — R000 Tachycardia, unspecified: Secondary | ICD-10-CM | POA: Diagnosis not present

## 2017-10-22 DIAGNOSIS — B9620 Unspecified Escherichia coli [E. coli] as the cause of diseases classified elsewhere: Secondary | ICD-10-CM | POA: Diagnosis not present

## 2017-10-22 DIAGNOSIS — R932 Abnormal findings on diagnostic imaging of liver and biliary tract: Secondary | ICD-10-CM | POA: Diagnosis not present

## 2017-10-22 DIAGNOSIS — R748 Abnormal levels of other serum enzymes: Secondary | ICD-10-CM | POA: Diagnosis not present

## 2017-10-22 DIAGNOSIS — F17210 Nicotine dependence, cigarettes, uncomplicated: Secondary | ICD-10-CM | POA: Diagnosis not present

## 2017-10-22 DIAGNOSIS — E440 Moderate protein-calorie malnutrition: Secondary | ICD-10-CM | POA: Diagnosis not present

## 2017-10-22 DIAGNOSIS — J849 Interstitial pulmonary disease, unspecified: Secondary | ICD-10-CM

## 2017-10-22 DIAGNOSIS — N200 Calculus of kidney: Secondary | ICD-10-CM | POA: Diagnosis not present

## 2017-10-22 DIAGNOSIS — J90 Pleural effusion, not elsewhere classified: Secondary | ICD-10-CM | POA: Diagnosis not present

## 2017-10-22 DIAGNOSIS — E43 Unspecified severe protein-calorie malnutrition: Secondary | ICD-10-CM | POA: Diagnosis not present

## 2017-10-22 DIAGNOSIS — R739 Hyperglycemia, unspecified: Secondary | ICD-10-CM | POA: Diagnosis not present

## 2017-10-22 DIAGNOSIS — K8033 Calculus of bile duct with acute cholangitis with obstruction: Secondary | ICD-10-CM | POA: Diagnosis not present

## 2017-10-22 DIAGNOSIS — Z7982 Long term (current) use of aspirin: Secondary | ICD-10-CM | POA: Diagnosis not present

## 2017-10-22 DIAGNOSIS — E873 Alkalosis: Secondary | ICD-10-CM | POA: Diagnosis not present

## 2017-10-22 DIAGNOSIS — I10 Essential (primary) hypertension: Secondary | ICD-10-CM | POA: Diagnosis not present

## 2017-10-22 DIAGNOSIS — E46 Unspecified protein-calorie malnutrition: Secondary | ICD-10-CM | POA: Diagnosis not present

## 2017-10-22 DIAGNOSIS — K7290 Hepatic failure, unspecified without coma: Secondary | ICD-10-CM | POA: Diagnosis not present

## 2017-10-22 DIAGNOSIS — R0902 Hypoxemia: Secondary | ICD-10-CM | POA: Diagnosis not present

## 2017-10-22 DIAGNOSIS — K8031 Calculus of bile duct with cholangitis, unspecified, with obstruction: Secondary | ICD-10-CM | POA: Diagnosis not present

## 2017-10-22 DIAGNOSIS — M6281 Muscle weakness (generalized): Secondary | ICD-10-CM | POA: Diagnosis not present

## 2017-10-22 DIAGNOSIS — K56600 Partial intestinal obstruction, unspecified as to cause: Secondary | ICD-10-CM

## 2017-10-22 DIAGNOSIS — J9611 Chronic respiratory failure with hypoxia: Secondary | ICD-10-CM | POA: Diagnosis not present

## 2017-10-22 DIAGNOSIS — E441 Mild protein-calorie malnutrition: Secondary | ICD-10-CM | POA: Diagnosis not present

## 2017-10-22 DIAGNOSIS — J690 Pneumonitis due to inhalation of food and vomit: Secondary | ICD-10-CM | POA: Diagnosis not present

## 2017-10-22 DIAGNOSIS — K3184 Gastroparesis: Secondary | ICD-10-CM | POA: Diagnosis not present

## 2017-10-22 DIAGNOSIS — J80 Acute respiratory distress syndrome: Secondary | ICD-10-CM | POA: Diagnosis not present

## 2017-10-22 DIAGNOSIS — G8929 Other chronic pain: Secondary | ICD-10-CM | POA: Diagnosis not present

## 2017-10-22 DIAGNOSIS — J9601 Acute respiratory failure with hypoxia: Secondary | ICD-10-CM | POA: Diagnosis not present

## 2017-10-22 DIAGNOSIS — A419 Sepsis, unspecified organism: Secondary | ICD-10-CM | POA: Diagnosis not present

## 2017-10-22 DIAGNOSIS — Z4682 Encounter for fitting and adjustment of non-vascular catheter: Secondary | ICD-10-CM | POA: Diagnosis not present

## 2017-10-22 DIAGNOSIS — K567 Ileus, unspecified: Secondary | ICD-10-CM | POA: Diagnosis not present

## 2017-10-22 DIAGNOSIS — J449 Chronic obstructive pulmonary disease, unspecified: Secondary | ICD-10-CM | POA: Diagnosis not present

## 2017-10-22 DIAGNOSIS — E785 Hyperlipidemia, unspecified: Secondary | ICD-10-CM | POA: Diagnosis not present

## 2017-10-22 DIAGNOSIS — R1312 Dysphagia, oropharyngeal phase: Secondary | ICD-10-CM | POA: Diagnosis not present

## 2017-10-22 DIAGNOSIS — Z789 Other specified health status: Secondary | ICD-10-CM | POA: Diagnosis not present

## 2017-10-22 DIAGNOSIS — M810 Age-related osteoporosis without current pathological fracture: Secondary | ICD-10-CM | POA: Diagnosis not present

## 2017-10-22 DIAGNOSIS — Z79899 Other long term (current) drug therapy: Secondary | ICD-10-CM | POA: Diagnosis not present

## 2017-10-22 DIAGNOSIS — K8050 Calculus of bile duct without cholangitis or cholecystitis without obstruction: Secondary | ICD-10-CM | POA: Diagnosis not present

## 2017-10-22 DIAGNOSIS — I700 Atherosclerosis of aorta: Secondary | ICD-10-CM | POA: Diagnosis not present

## 2017-10-22 DIAGNOSIS — M8000XA Age-related osteoporosis with current pathological fracture, unspecified site, initial encounter for fracture: Secondary | ICD-10-CM | POA: Diagnosis not present

## 2017-10-22 DIAGNOSIS — J984 Other disorders of lung: Secondary | ICD-10-CM | POA: Diagnosis not present

## 2017-10-22 DIAGNOSIS — J9621 Acute and chronic respiratory failure with hypoxia: Secondary | ICD-10-CM | POA: Diagnosis not present

## 2017-10-22 DIAGNOSIS — T80211D Bloodstream infection due to central venous catheter, subsequent encounter: Secondary | ICD-10-CM | POA: Diagnosis not present

## 2017-10-22 DIAGNOSIS — Z452 Encounter for adjustment and management of vascular access device: Secondary | ICD-10-CM | POA: Diagnosis not present

## 2017-10-22 DIAGNOSIS — M4854XD Collapsed vertebra, not elsewhere classified, thoracic region, subsequent encounter for fracture with routine healing: Secondary | ICD-10-CM | POA: Diagnosis not present

## 2017-10-22 DIAGNOSIS — R109 Unspecified abdominal pain: Secondary | ICD-10-CM | POA: Diagnosis not present

## 2017-10-22 DIAGNOSIS — F419 Anxiety disorder, unspecified: Secondary | ICD-10-CM | POA: Diagnosis not present

## 2017-10-22 DIAGNOSIS — Z7401 Bed confinement status: Secondary | ICD-10-CM | POA: Diagnosis not present

## 2017-10-22 DIAGNOSIS — E876 Hypokalemia: Secondary | ICD-10-CM | POA: Diagnosis not present

## 2017-10-22 DIAGNOSIS — Z7951 Long term (current) use of inhaled steroids: Secondary | ICD-10-CM | POA: Diagnosis not present

## 2017-10-22 DIAGNOSIS — R918 Other nonspecific abnormal finding of lung field: Secondary | ICD-10-CM | POA: Diagnosis not present

## 2017-10-22 DIAGNOSIS — F329 Major depressive disorder, single episode, unspecified: Secondary | ICD-10-CM | POA: Diagnosis not present

## 2017-10-22 DIAGNOSIS — Z87442 Personal history of urinary calculi: Secondary | ICD-10-CM | POA: Diagnosis not present

## 2017-10-22 DIAGNOSIS — R262 Difficulty in walking, not elsewhere classified: Secondary | ICD-10-CM | POA: Diagnosis not present

## 2017-10-22 DIAGNOSIS — K219 Gastro-esophageal reflux disease without esophagitis: Secondary | ICD-10-CM | POA: Diagnosis not present

## 2017-10-22 DIAGNOSIS — M545 Low back pain: Secondary | ICD-10-CM | POA: Diagnosis not present

## 2017-10-22 DIAGNOSIS — K7200 Acute and subacute hepatic failure without coma: Secondary | ICD-10-CM | POA: Diagnosis not present

## 2017-10-22 DIAGNOSIS — R2681 Unsteadiness on feet: Secondary | ICD-10-CM | POA: Diagnosis not present

## 2017-10-22 DIAGNOSIS — K710 Toxic liver disease with cholestasis: Secondary | ICD-10-CM | POA: Diagnosis not present

## 2017-10-22 DIAGNOSIS — R338 Other retention of urine: Secondary | ICD-10-CM | POA: Diagnosis not present

## 2017-10-22 DIAGNOSIS — K831 Obstruction of bile duct: Secondary | ICD-10-CM | POA: Diagnosis not present

## 2017-10-22 DIAGNOSIS — K838 Other specified diseases of biliary tract: Secondary | ICD-10-CM | POA: Diagnosis not present

## 2017-10-22 DIAGNOSIS — J9690 Respiratory failure, unspecified, unspecified whether with hypoxia or hypercapnia: Secondary | ICD-10-CM | POA: Diagnosis not present

## 2017-10-22 DIAGNOSIS — K8309 Other cholangitis: Secondary | ICD-10-CM | POA: Diagnosis not present

## 2017-10-22 DIAGNOSIS — Z6827 Body mass index (BMI) 27.0-27.9, adult: Secondary | ICD-10-CM

## 2017-10-22 DIAGNOSIS — M4856XD Collapsed vertebra, not elsewhere classified, lumbar region, subsequent encounter for fracture with routine healing: Secondary | ICD-10-CM | POA: Diagnosis not present

## 2017-10-22 DIAGNOSIS — G43909 Migraine, unspecified, not intractable, without status migrainosus: Secondary | ICD-10-CM | POA: Diagnosis not present

## 2017-10-22 DIAGNOSIS — R1084 Generalized abdominal pain: Secondary | ICD-10-CM | POA: Diagnosis not present

## 2017-10-22 LAB — CULTURE, BLOOD (ROUTINE X 2)
Culture: NO GROWTH
Culture: NO GROWTH
Special Requests: ADEQUATE
Special Requests: ADEQUATE

## 2017-10-22 LAB — DIFFERENTIAL
Basophils Absolute: 0 10*3/uL (ref 0.0–0.1)
Basophils Relative: 0 %
Eosinophils Absolute: 0.3 10*3/uL (ref 0.0–0.7)
Eosinophils Relative: 2 %
Lymphocytes Relative: 9 %
Lymphs Abs: 1.5 10*3/uL (ref 0.7–4.0)
Monocytes Absolute: 0.7 10*3/uL (ref 0.1–1.0)
Monocytes Relative: 4 %
Neutro Abs: 14.3 10*3/uL — ABNORMAL HIGH (ref 1.7–7.7)
Neutrophils Relative %: 85 %

## 2017-10-22 LAB — MAGNESIUM: Magnesium: 2.3 mg/dL (ref 1.7–2.4)

## 2017-10-22 LAB — MITOCHONDRIAL ANTIBODIES: Mitochondrial M2 Ab, IgG: 29.1 Units — ABNORMAL HIGH (ref 0.0–20.0)

## 2017-10-22 LAB — CBC
HCT: 31.6 % — ABNORMAL LOW (ref 39.0–52.0)
Hemoglobin: 11 g/dL — ABNORMAL LOW (ref 13.0–17.0)
MCH: 31.9 pg (ref 26.0–34.0)
MCHC: 34.8 g/dL (ref 30.0–36.0)
MCV: 91.6 fL (ref 78.0–100.0)
Platelets: 277 10*3/uL (ref 150–400)
RBC: 3.45 MIL/uL — ABNORMAL LOW (ref 4.22–5.81)
RDW: 13.9 % (ref 11.5–15.5)
WBC: 16.8 10*3/uL — ABNORMAL HIGH (ref 4.0–10.5)

## 2017-10-22 LAB — HEPATIC FUNCTION PANEL
ALT: 58 U/L (ref 17–63)
AST: 109 U/L — ABNORMAL HIGH (ref 15–41)
Albumin: 1.8 g/dL — ABNORMAL LOW (ref 3.5–5.0)
Alkaline Phosphatase: 278 U/L — ABNORMAL HIGH (ref 38–126)
Bilirubin, Direct: 14.8 mg/dL — ABNORMAL HIGH (ref 0.1–0.5)
Indirect Bilirubin: 7 mg/dL — ABNORMAL HIGH (ref 0.3–0.9)
Total Bilirubin: 21.8 mg/dL (ref 0.3–1.2)
Total Protein: 6.3 g/dL — ABNORMAL LOW (ref 6.5–8.1)

## 2017-10-22 LAB — BASIC METABOLIC PANEL
Anion gap: 12 (ref 5–15)
BUN: 12 mg/dL (ref 6–20)
CO2: 18 mmol/L — ABNORMAL LOW (ref 22–32)
Calcium: 7.9 mg/dL — ABNORMAL LOW (ref 8.9–10.3)
Chloride: 103 mmol/L (ref 101–111)
Creatinine, Ser: 0.94 mg/dL (ref 0.61–1.24)
GFR calc Af Amer: >60 mL/min (ref >60)
GFR calc non Af Amer: >60 mL/min (ref >60)
Glucose, Bld: 95 mg/dL (ref 65–99)
Potassium: 4 mmol/L (ref 3.5–5.1)
Sodium: 133 mmol/L — ABNORMAL LOW (ref 135–145)

## 2017-10-22 LAB — GAMMA GT: GGT: 288 U/L — ABNORMAL HIGH (ref 7–50)

## 2017-10-22 LAB — TRIGLYCERIDES: Triglycerides: 348 mg/dL — ABNORMAL HIGH (ref <150)

## 2017-10-22 LAB — PREALBUMIN: Prealbumin: <5 mg/dL — ABNORMAL LOW (ref 18–38)

## 2017-10-22 LAB — PROCALCITONIN: Procalcitonin: 0.84 ng/mL

## 2017-10-22 LAB — PROTIME-INR
INR: 1.29
Prothrombin Time: 16 seconds — ABNORMAL HIGH (ref 11.4–15.2)

## 2017-10-22 LAB — AMMONIA: Ammonia: 53 umol/L — ABNORMAL HIGH (ref 9–35)

## 2017-10-22 LAB — LACTATE DEHYDROGENASE: LDH: 420 U/L — ABNORMAL HIGH (ref 98–192)

## 2017-10-22 LAB — PATHOLOGIST SMEAR REVIEW

## 2017-10-22 LAB — PHOSPHORUS: Phosphorus: 4 mg/dL (ref 2.5–4.6)

## 2017-10-22 LAB — LIPASE, BLOOD: Lipase: 40 U/L (ref 11–51)

## 2017-10-22 LAB — GLUCOSE, CAPILLARY: Glucose-Capillary: 89 mg/dL (ref 65–99)

## 2017-10-22 MED ORDER — SODIUM CHLORIDE 0.9% FLUSH: 10.0000 mL | INTRAVENOUS | Status: DC | PRN

## 2017-10-22 MED ORDER — SODIUM CHLORIDE 0.9% FLUSH
10.0000 mL | Freq: Two times a day (BID) | INTRAVENOUS | Status: DC
  Administered 2017-10-22: 10 mL

## 2017-10-22 MED ORDER — ZINC TRACE METAL 1 MG/ML IV SOLN
INTRAVENOUS | Status: DC
  Filled 2017-10-22: qty 381.6

## 2017-10-22 MED ORDER — ALBUTEROL SULFATE (2.5 MG/3ML) 0.083% IN NEBU
2.5000 mg | INHALATION_SOLUTION | RESPIRATORY_TRACT | Status: DC
  Administered 2017-10-22: 2.5 mg via RESPIRATORY_TRACT
  Filled 2017-10-22 (×2): qty 3

## 2017-10-22 MED ORDER — CHLORHEXIDINE GLUCONATE CLOTH 2 % EX PADS
6.0000 | MEDICATED_PAD | Freq: Every day | CUTANEOUS | Status: DC
  Administered 2017-10-22: 6 via TOPICAL

## 2017-10-22 MED ORDER — SODIUM CHLORIDE 0.9 % IV SOLN
INTRAVENOUS | Status: DC
  Administered 2017-10-22: 18:00:00 via INTRAVENOUS
  Filled 2017-10-22 (×2): qty 1000

## 2017-10-22 MED ORDER — FLEET ENEMA 7-19 GM/118ML RE ENEM
1.0000 | ENEMA | Freq: Once | RECTAL | Status: AC
  Administered 2017-10-22: 1 via RECTAL
  Filled 2017-10-22: qty 1

## 2017-10-22 MED ORDER — INSULIN ASPART 100 UNIT/ML ~~LOC~~ SOLN: 0.0000 [IU] | Freq: Four times a day (QID) | SUBCUTANEOUS | Status: DC

## 2017-10-22 NOTE — Progress Notes (Signed)
Pharmacy Antibiotic Note  [name] is a 72 y.o. male admitted on 10/14/2017.  Pharmacy has been consulted for vancomycin and Zosyn dosing.  Plan: Continue vancomycin 750 mg IV q12h Continue Zosyn 3.375 g IV q8h Monitor clinic progress, renal function, VT prn F/u BCx, C/S, future de-escalation, & length of therapy  Consider stopping vancomycin today if only worried about a respiratory infection as MRSA PCR negative  Height: 5\' 7"  (170.2 cm) Weight: 172 lb 13.5 oz (78.4 kg) IBW/kg (Calculated) : 66.1  Temp (24hrs), Avg:98.4 F (36.9 C), Min:97.9 F (36.6 C), Max:98.7 F (37.1 C)  Recent Labs  Lab 10/17/17 1507 10/18/17 0207 10/19/17 0531 10/20/17 0310 10/21/17 1245 10/22/17 0428  WBC  --  8.1 10.5 12.1* 14.7* 16.8*  CREATININE  --  0.99 0.93 0.87 0.85 0.94  LATICACIDVEN 1.4  --   --   --   --   --     Estimated Creatinine Clearance: 67.4 mL/min (by C-G formula based on SCr of 0.94 mg/dL).    No Known Allergies  Thank you for allowing pharmacy to be a part of this patient's care.  [name] 10/22/2017 7:27 AM

## 2017-10-22 NOTE — Progress Notes (Signed)
Patient ID: [name], male   DOB: 1946-02-04, 72 y.o.   MRN: [id number]          [facility] for Infectious Disease  Date of Admission:  10/14/2017    Total days of antibiotics 9        Day 3 vancomycin        Day 3 piperacillin tazobactam        ASSESSMENT: His alkaline phosphatase and bilirubin continue to climb.  His chest x-ray shows diffuse bilateral interstitial infiltrates.  I am still suspicious of acute lung injury rather than aspiration pneumonia or healthcare associated pneumonia.  His MRSA screen is negative.  I favor stopping vancomycin now.  PLAN: 1. Continue piperacillin tazobactam  2. Recommend stopping vancomycin 3. Repeat sputum culture 4. Pursue evaluation of acute liver pathology  Principal Problem:   Hepatic failure (HCC) Active Problems:   Cholestasis   Thrombocytopenia (HCC)   Pulmonary infiltrates   Fever   COPD (chronic obstructive pulmonary disease) (HCC)   Abdominal pain   Gastroparesis   Abnormal chest CT   Acute respiratory distress   Acute respiratory alkalosis   Mediastinal lymphadenopathy   Pleural effusion, bilateral   Abdominal distention   Enteropathogenic Escherichia coli infection   Mild malnutrition (HCC)   Scheduled Meds: . albuterol  2.5 mg Nebulization QID  . budesonide (PULMICORT) nebulizer solution  0.25 mg Nebulization BID  . chlorhexidine  15 mL Mouth Rinse BID  . feeding supplement (GLUCERNA SHAKE)  237 mL Oral TID BM  . gabapentin  300 mg Oral BID  . insulin aspart  0-9 Units Subcutaneous Q6H  . mouth rinse  15 mL Mouth Rinse q12n4p  . pantoprazole  40 mg Oral Daily  . sodium chloride flush  3 mL Intravenous Q12H  . sodium phosphate  1 enema Rectal Once   Continuous Infusions: . sodium chloride    . dextrose 5 %-0.9% nacl with kcl 125 mL/hr at 10/22/17 0503  . piperacillin-tazobactam (ZOSYN)  IV Stopped (10/22/17 9518)  . 0.9 % sodium chloride with kcl    . TPN ADULT (ION)    . vancomycin 750 mg  (10/22/17 0837)   PRN Meds:.sodium chloride, acetaminophen, albuterol, chlorproMAZINE, LORazepam, morphine injection, ondansetron (ZOFRAN) IV, sodium chloride flush   SUBJECTIVE: He says that he is feeling a little bit better.  He is no longer having any abdominal pain.  He is not having any more nausea or vomiting.  He still feels bloated but he is passing gas.  He says that his cough is unchanged and still productive of yellow sputum.  Review of Systems: Review of Systems  Constitutional: Negative for chills, diaphoresis and fever.  Respiratory: Positive for cough, sputum production and shortness of breath.   Cardiovascular: Negative for chest pain.  Gastrointestinal: Positive for constipation. Negative for abdominal pain, diarrhea, nausea and vomiting.    No Known Allergies  OBJECTIVE: Vitals:   10/22/17 0200 10/22/17 0353 10/22/17 0400 10/22/17 0800  BP: 136/68   127/76  Pulse: (!) 108   (!) 101  Resp: (!) 32   (!) 33  Temp:  98.6 F (37 C)  97.9 F (36.6 C)  TempSrc:  Oral  Oral  SpO2: 92%   94%  Weight:   172 lb 13.5 oz (78.4 kg)   Height:       Body mass index is 27.07 kg/m.  Physical Exam  Constitutional:  He appears comfortable but somewhat dejected.  Cardiovascular: Normal rate and regular rhythm.  No murmur heard. Pulmonary/Chest: Effort normal.  Clear anteriorly.  He is using nasal prong oxygen.  Abdominal: Soft. He exhibits distension. There is no tenderness.  Tympanitic with quiet bowel sounds.    Lab Results Lab Results  Component Value Date   WBC 16.8 (H) 10/22/2017   HGB 11.0 (L) 10/22/2017   HCT 31.6 (L) 10/22/2017   MCV 91.6 10/22/2017   PLT 277 10/22/2017    Lab Results  Component Value Date   CREATININE 0.94 10/22/2017   BUN 12 10/22/2017   NA 133 (L) 10/22/2017   K 4.0 10/22/2017   CL 103 10/22/2017   CO2 18 (L) 10/22/2017    Lab Results  Component Value Date   ALT 58 10/22/2017   AST 109 (H) 10/22/2017   ALKPHOS 278 (H)  10/22/2017   BILITOT 21.8 (HH) 10/22/2017     Microbiology: Recent Results (from the past 240 hour(s))  Blood culture (routine x 2)     Status: None   Collection Time: 10/15/17  6:30 AM  Result Value Ref Range Status   Specimen Description BLOOD LEFT HAND  Final   Special Requests   Final    BOTTLES DRAWN AEROBIC AND ANAEROBIC Blood Culture results may not be optimal due to an inadequate volume of blood received in culture bottles   Culture NO GROWTH 5 DAYS  Final   Report Status 10/20/2017 FINAL  Final  Blood culture (routine x 2)     Status: None   Collection Time: 10/15/17  6:45 AM  Result Value Ref Range Status   Specimen Description BLOOD RIGHT HAND  Final   Special Requests   Final    BOTTLES DRAWN AEROBIC AND ANAEROBIC Blood Culture adequate volume   Culture NO GROWTH 5 DAYS  Final   Report Status 10/20/2017 FINAL  Final  Culture, blood (routine x 2)     Status: None (Preliminary result)   Collection Time: 10/17/17  4:25 AM  Result Value Ref Range Status   Specimen Description BLOOD LEFT ANTECUBITAL  Final   Special Requests   Final    BOTTLES DRAWN AEROBIC AND ANAEROBIC Blood Culture adequate volume   Culture NO GROWTH 4 DAYS  Final   Report Status PENDING  Incomplete  Culture, blood (routine x 2)     Status: None (Preliminary result)   Collection Time: 10/17/17  4:25 AM  Result Value Ref Range Status   Specimen Description BLOOD LEFT HAND  Final   Special Requests   Final    BOTTLES DRAWN AEROBIC ONLY Blood Culture adequate volume   Culture NO GROWTH 4 DAYS  Final   Report Status PENDING  Incomplete  Culture, expectorated sputum-assessment     Status: None   Collection Time: 10/17/17 10:50 AM  Result Value Ref Range Status   Specimen Description SPUTUM  Final   Special Requests NONE  Final   Sputum evaluation   Final    Sputum specimen not acceptable for testing.  Please recollect.   Gram Stain Report Called to,Read Back By and Verified With: A FOWLER,RN AT 1191  10/17/17 BY L BENFIELD    Report Status 10/17/2017 FINAL  Final  Gastrointestinal Panel by PCR , Stool     Status: Abnormal   Collection Time: 10/17/17  8:47 PM  Result Value Ref Range Status   Campylobacter species NOT DETECTED NOT DETECTED Final   Plesimonas shigelloides NOT DETECTED NOT DETECTED Final   Salmonella species NOT DETECTED NOT DETECTED Final   Yersinia enterocolitica  NOT DETECTED NOT DETECTED Final   Vibrio species NOT DETECTED NOT DETECTED Final   Vibrio cholerae NOT DETECTED NOT DETECTED Final   Enteroaggregative E coli (EAEC) NOT DETECTED NOT DETECTED Final   Enteropathogenic E coli (EPEC) DETECTED (A) NOT DETECTED Final    Comment: RESULT CALLED TO, READ BACK BY AND VERIFIED WITH: [facility] JONES 10/18/17 @ 1813 MLK    Enterotoxigenic E coli (ETEC) NOT DETECTED NOT DETECTED Final   Shiga like toxin producing E coli (STEC) NOT DETECTED NOT DETECTED Final   Shigella/Enteroinvasive E coli (EIEC) NOT DETECTED NOT DETECTED Final   Cryptosporidium NOT DETECTED NOT DETECTED Final   Cyclospora cayetanensis NOT DETECTED NOT DETECTED Final   Entamoeba histolytica NOT DETECTED NOT DETECTED Final   Giardia lamblia NOT DETECTED NOT DETECTED Final   Adenovirus F40/41 NOT DETECTED NOT DETECTED Final   Astrovirus NOT DETECTED NOT DETECTED Final   Norovirus GI/GII NOT DETECTED NOT DETECTED Final   Rotavirus A NOT DETECTED NOT DETECTED Final   Sapovirus (I, II, IV, and V) NOT DETECTED NOT DETECTED Final    Comment: Performed at [facility], [address] [place], [place] [address]  Respiratory Panel by PCR     Status: None   Collection Time: 10/17/17  8:47 PM  Result Value Ref Range Status   Adenovirus NOT DETECTED NOT DETECTED Final   Coronavirus 229E NOT DETECTED NOT DETECTED Final   Coronavirus HKU1 NOT DETECTED NOT DETECTED Final   Coronavirus NL63 NOT DETECTED NOT DETECTED Final   Coronavirus OC43 NOT DETECTED NOT DETECTED Final   Metapneumovirus NOT DETECTED NOT  DETECTED Final   Rhinovirus / Enterovirus NOT DETECTED NOT DETECTED Final   Influenza A NOT DETECTED NOT DETECTED Final   Influenza B NOT DETECTED NOT DETECTED Final   Parainfluenza Virus 1 NOT DETECTED NOT DETECTED Final   Parainfluenza Virus 2 NOT DETECTED NOT DETECTED Final   Parainfluenza Virus 3 NOT DETECTED NOT DETECTED Final   Parainfluenza Virus 4 NOT DETECTED NOT DETECTED Final   Respiratory Syncytial Virus NOT DETECTED NOT DETECTED Final   Bordetella pertussis NOT DETECTED NOT DETECTED Final   Chlamydophila pneumoniae NOT DETECTED NOT DETECTED Final   Mycoplasma pneumoniae NOT DETECTED NOT DETECTED Final  Culture, expectorated sputum-assessment     Status: None   Collection Time: 10/17/17  9:45 PM  Result Value Ref Range Status   Specimen Description EXPECTORATED SPUTUM  Final   Special Requests NONE  Final   Sputum evaluation THIS SPECIMEN IS ACCEPTABLE FOR SPUTUM CULTURE  Final   Report Status 10/18/2017 FINAL  Final  Acid Fast Smear (AFB)     Status: None   Collection Time: 10/17/17  9:45 PM  Result Value Ref Range Status   AFB Specimen Processing Concentration  Final   Acid Fast Smear Negative  Final    Comment: (NOTE) Performed At: [facility] [address] [place], [place] [id number] Rush Farmer MD YI:[id number]    Source (AFB) EXPECTORATED SPUTUM  Final  Culture, respiratory (NON-Expectorated)     Status: None   Collection Time: 10/17/17  9:45 PM  Result Value Ref Range Status   Specimen Description EXPECTORATED SPUTUM  Final   Special Requests NONE Reflexed from [id number]  Final   Gram Stain   Final    MODERATE WBC PRESENT, PREDOMINANTLY MONONUCLEAR FEW SQUAMOUS EPITHELIAL CELLS PRESENT FEW BUDDING YEAST SEEN RARE GRAM POSITIVE COCCI IN PAIRS    Culture FEW CANDIDA TROPICALIS  Final   Report Status 10/20/2017 FINAL  Final  Culture, blood (Routine X 2) w Reflex to ID Panel     Status: None (Preliminary result)   Collection Time: 10/19/17  7:27 PM   Result Value Ref Range Status   Specimen Description BLOOD LEFT ANTECUBITAL  Final   Special Requests   Final    BOTTLES DRAWN AEROBIC AND ANAEROBIC Blood Culture adequate volume   Culture NO GROWTH 2 DAYS  Final   Report Status PENDING  Incomplete  Culture, blood (Routine X 2) w Reflex to ID Panel     Status: None (Preliminary result)   Collection Time: 10/19/17  7:29 PM  Result Value Ref Range Status   Specimen Description BLOOD BLOOD RIGHT HAND  Final   Special Requests IN PEDIATRIC BOTTLE Blood Culture adequate volume  Final   Culture NO GROWTH 2 DAYS  Final   Report Status PENDING  Incomplete  MRSA PCR Screening     Status: None   Collection Time: 10/21/17  5:14 PM  Result Value Ref Range Status   MRSA by PCR NEGATIVE NEGATIVE Final    Comment:        The GeneXpert MRSA Assay (FDA approved for NASAL specimens only), is one component of a comprehensive MRSA colonization surveillance program. It is not intended to diagnose MRSA infection nor to guide or monitor treatment for MRSA infections.     Michel Bickers, MD [facility] [phone] pager   [phone] cell 10/22/2017, 9:20 AM

## 2017-10-22 NOTE — Progress Notes (Signed)
  Date: 10/22/2017  Patient name: [name]  Medical record number: [id number]  Date of birth: Jan 27, 1946   I have seen and evaluated this patient and I have discussed the plan of care with the house staff. Please see their note for complete details. I concur with their findings with the following additions/corrections:   [name] looks worse again today. He is still tachypneic to the 30s and requiring high flow nasal cannula 14 L to maintain his saturations. He is in moderate respiratory distress using accessory muscles. He has bilateral crackles and wheezing. He continues to be tachycardic to the 110s, sinus. His jaundice is more pronounced today.   Bilirubin is up to 21.8, and the remainder of his LFTs are starting to rise again today. AST is 109, ALT 58, alkaline phosphatase 278, GGT 288, albumin 1.8. INR 1.29. Liver ultrasound with Dopplers again demonstrated no gallstones, no common bile duct dilation, and normal hepatic blood flow.  Diagnostic testing still has not revealed an underlying etiology for his pulmonary and hepatic processes. I discussed his case with our ID and GI consultants, as well as with a hepatologist at [facility]. At this point, we are all in agreement that his condition warrants transfer to a tertiary care center for expert evaluation. I spoke with the transfer centers at [place], [place], and [facility], all of whom did not have beds available originally today. However, [facility] called this evening and had a bed available for him, so we have initiated the transfer process.  Oda Kilts, MD 10/22/2017, 4:53 PM

## 2017-10-22 NOTE — Progress Notes (Signed)
Peripherally Inserted Central Catheter/Midline Placement  The IV Nurse has discussed with the patient and/or persons authorized to consent for the patient, the purpose of this procedure and the potential benefits and risks involved with this procedure.  The benefits include less needle sticks, lab draws from the catheter, and the patient may be discharged home with the catheter. Risks include, but not limited to, infection, bleeding, blood clot (thrombus formation), and puncture of an artery; nerve damage and irregular heartbeat and possibility to perform a PICC exchange if needed/ordered by physician.  Alternatives to this procedure were also discussed.  Bard Power PICC patient education guide, fact sheet on infection prevention and patient information card has been provided to patient /or left at bedside.    PICC/Midline Placement Documentation  PICC Triple Lumen 34/74/25 PICC Right Basilic (Active)     Consent obtained by Jefferey Pica RN  Catalina Pizza 10/22/2017, 3:12 PM

## 2017-10-22 NOTE — Progress Notes (Signed)
PULMONARY / CRITICAL CARE MEDICINE   Name: [name] MRN: [id number] DOB: 05-30-1946    ADMISSION DATE:  10/14/2017 CONSULTATION DATE: 10/20/2017  REFERRING MD: Internal medicine teaching service  CHIEF COMPLAINT: Hypoxia  HISTORY OF PRESENT ILLNESS:   72 year old male with known hepatic failure with known tobacco use chronic lumbar pain who presented with acute diffuse abdominal pain.  CT scan revealed bilateral groundglass opacities consistent with infectious process versus edema.  Pulmonary critical care was consulted for evaluation and treatment.  SUBJECTIVE:  72 year old male who is jaundice unfortunately on nasal cannula sats 98% no acute distress at rest.  VITAL SIGNS: BP 127/76 (BP Location: Left Arm)   Pulse (!) 101   Temp 97.9 F (36.6 C) (Oral)   Resp (!) 33   Ht '5\' 7"'$  (1.702 m)   Wt 78.4 kg (172 lb 13.5 oz)   SpO2 93%   BMI 27.07 kg/m   HEMODYNAMICS:    VENTILATOR SETTINGS:    INTAKE / OUTPUT: I/O last 3 completed shifts: In: 4521.8 [P.O.:270; I.V.:3489.3; IV Piggyback:762.5] Out: 2350 [Urine:2350]  PHYSICAL EXAMINATION: General: Frail elderly male in no acute distress Neuro: Follows commands moves all extremities x4 HEENT: No JVD or lymphadenopathy is appreciated Cardiovascular: Heart sounds are regular sinus tach 106 Lungs: Decreased breath sounds throughout, mild bibasilar crackles, currently on 14 L with O2 saturations 98% Abdomen: Distended decreased bowel sounds Musculoskeletal: Intact Skin: Jaundice negative lower extremity edema  LABS:  BMET Recent Labs  Lab 10/20/17 0310 10/21/17 1245 10/22/17 0428  NA 133* 130* 133*  K 4.2 3.9 4.0  CL 106 103 103  CO2 18* 17* 18*  BUN '9 11 12  '$ CREATININE 0.87 0.85 0.94  GLUCOSE 124* 126* 95    Electrolytes Recent Labs  Lab 10/17/17 2036  10/20/17 0310 10/21/17 1245 10/22/17 0428  CALCIUM  --    < > 8.0* 7.5* 7.9*  MG 1.8  --   --   --  2.3  PHOS  --   --   --   --  4.0   < > =  values in this interval not displayed.    CBC Recent Labs  Lab 10/20/17 0310 10/21/17 1245 10/22/17 0428  WBC 12.1* 14.7* 16.8*  HGB 11.9* 10.6* 11.0*  HCT 33.4* 30.2* 31.6*  PLT 172 228 277    Coag's Recent Labs  Lab 10/17/17 0333 10/22/17 0428  INR 1.09 1.29    Sepsis Markers Recent Labs  Lab 10/17/17 1507  LATICACIDVEN 1.4    ABG Recent Labs  Lab 10/17/17 1613 10/19/17 1845 10/20/17 1128  PHART 7.482* 7.443 7.463*  PCO2ART 27.4* 28.6* 26.7*  PO2ART 75.5* 88.9 90.1    Liver Enzymes Recent Labs  Lab 10/20/17 0310 10/21/17 1245 10/21/17 1300 10/22/17 0428  AST 62* 92*  --  109*  ALT 46 52  --  58  ALKPHOS 131* 209*  --  278*  BILITOT 14.4* 19.4* 19.4* 21.8*  ALBUMIN 1.9* 1.8*  --  1.8*    Cardiac Enzymes Recent Labs  Lab 10/19/17 1635  TROPONINI 0.03*    Glucose No results for input(s): GLUCAP in the last 168 hours.  Imaging Dg Abd 1 View  Result Date: 10/21/2017 CLINICAL DATA:  Abdominal distension. EXAM: ABDOMEN - 1 VIEW COMPARISON:  Abdominal radiograph 10/19/2017; CT abdomen 10/17/2017 FINDINGS: Interval increase in gaseous distention of the small and large bowel throughout the abdomen. Supine evaluation limited for the detection of free intraperitoneal air. Oral contrast material within the cecum  and ascending colon. IMPRESSION: Increased gaseous distention of large and small bowel loops throughout the abdomen raising the possibility of ileus. Electronically Signed   By: Lovey Newcomer M.D.   On: 10/21/2017 15:41   Dg Chest Port 1 View  Result Date: 10/21/2017 CLINICAL DATA:  Cough. EXAM: PORTABLE CHEST 1 VIEW COMPARISON:  Chest x-ray dated October 19, 2017. FINDINGS: Stable cardiomediastinal silhouette. Unchanged diffuse airspace opacities throughout both lungs. No pleural effusion or pneumothorax. No acute osseous abnormality. IMPRESSION: Unchanged diffuse airspace disease in both lungs. Electronically Signed   By: Titus Dubin M.D.   On:  10/21/2017 15:44   US Liver Doppler  Result Date: 10/22/2017 CLINICAL DATA:  Trauma on this. EXAM: DUPLEX ULTRASOUND OF LIVER TECHNIQUE: Color and duplex Doppler ultrasound was performed to evaluate the hepatic in-flow and out-flow vessels. COMPARISON:  10/17/2017 CT abdomen/pelvis. 10/14/2017 right upper quadrant abdominal sonogram. FINDINGS: Scan is limited by patient dyspnea and inability to breath hold. Scan is also limited by bowel gas. Portal Vein Velocities Main:  23.1 cm/sec Right:  7.3 cm/sec Left:  11.5 cm/sec Hepatic Vein Velocities Right:  18.3 cm/sec Middle:  20.9 cm/sec Left:  27.5 cm/sec Hepatic Artery Velocity:  85.5 cm/sec Splenic Vein Velocity:  12.7 cm/sec Varices: None. Ascites: None. Patent IVC with appropriate flow direction. Hepatic and portal veins are patent with no evidence of thrombosis and with normal flow directions. Normal size spleen. IMPRESSION: No acute vascular abnormality on this hepatic vascular doppler scan. Portal, splenic and hepatic veins are patent with appropriate flow directions and no evidence of thrombosis. Electronically Signed   By: Ilona Sorrel M.D.   On: 10/22/2017 00:34     STUDIES:  CT scan as noted  CULTURES: 10/21/2017 MRSA screening PCR negative 10/19/2017 blood cultures x2>>  ANTIBIOTICS: Per infectious disease consult 10/19/2017 Zosyn>> 10/19/2017 vancomycin>> 10/20/2017 sputum culture with budding yeast, few gram-positive cocci.,  If you can do any tropicalis  SIGNIFICANT EVENTS: 10/22/2017 required 14 L of oxygen to maintain sats of 98%  LINES/TUBES:   Intake/Output Summary (Last 24 hours) at 10/22/2017 1052 Last data filed at 10/22/2017 6948 Gross per 24 hour  Intake 712.58 ml  Output 1950 ml  Net -1237.42 ml   Filed Weights   10/19/17 0535 10/21/17 1407 10/22/17 0400  Weight: 78 kg (171 lb 15.3 oz) 80 kg (176 lb 5.9 oz) 78.4 kg (172 lb 13.5 oz)  DISCUSSION: 72 year old with hepatic failure and bilateral groundglass opacities on  CT scan he does have a proximal required 12-14 L of nasal cannula oxygen during the day and BiPAP nocturnally.  Wean his oxygen as tolerated.  Follow-up chest x-rays periodically.  Plan is for [facility] to [facility] liver transplant.  ASSESSMENT / PLAN:  PULMONARY A: Hypoxic respiratory failure Chronic obstructive pulmonary disease BiPAP dependent nocturnally Groundglass opacities on CT scan Sed rate of 94 10/17/2017 P:   O2 as needed currently at 14 L/min with O2 saturations of 95% wean for sats greater than 92% Nocturnal BiPAP Follow radiographic data Bronchodilators Question role for steroids   CARDIOVASCULAR A:  No acute issues P:  Hemodynamically stable Consider 2 d echo for completeness  RENAL Lab Results  Component Value Date   CREATININE 0.94 10/22/2017   CREATININE 0.85 10/21/2017   CREATININE 0.87 10/20/2017   CREATININE 0.93 11/11/2012   Recent Labs  Lab 10/20/17 0310 10/21/17 1245 10/22/17 0428  K 4.2 3.9 4.0    A:   No acute issues P:  Monitor renal function  GASTROINTESTINAL A:   History of reflux disease Gastroparesis Hepatic failure P:   Per internal medicine  HEMATOLOGIC Recent Labs    10/21/17 1245 10/22/17 0428  HGB 10.6* 11.0*   A DVT prophylaxis P:  See MAR  INFECTIOUS A:   Per infectious disease consult P:   Antibiotics per ID  ENDOCRINE CBG (last 3)  No results for input(s): GLUCAP in the last 72 hours.   A:   No acute issue P:   Follow glucose on labs  NEUROLOGIC A:   Awake and follows commands P:   RASS goal: 0 Careful sedation   FAMILY  - Updates: No family at bedside  - Inter-disciplinary family meet or Palliative Care meeting due by:  day 7   Steve Minor ACNP Maryanna Shape PCCM Pager [phone] till 1 pm If no answer page [phone] 10/22/2017, 10:42 AM  Attending Note:  72 year old male with PMH above presenting to PCCM with SOB and liver failure.  I am unable to  correlate the pulmonary infiltrate to the liver disease.  Would very much like to perform a lung biopsy but will likely need intubation prior to that.  VATS maybe more desirable.  On exam, jaundiced, increased WOB and diffuse crackles.  I reviewed chest CT myself, ground glass opacities noted.  Discussed with PCCM-NP.  Acute respiratory failure:  - BiPAP in AM  - Monitor for airway protection  GGO:  DDx of hypersensitivity pneumonitis vs auto-immune would be difficult to ascertain without biopsies.  - Would like to see a VATS but unsure if would tolerate given over all picture.  - Check ESR, CRP, RF and ANA.  Hypoxemia:  - Titrate O2 for sat of 88-92%  Recommend transfer to a tertiary center.  PCCM will follow while here.  Patient seen and examined, agree with above note.  I dictated the care and orders written for this patient under my direction.  Rush Farmer, [place]

## 2017-10-22 NOTE — Progress Notes (Signed)
Initial Nutrition Assessment  DOCUMENTATION CODES:   Non-severe (moderate) malnutrition in context of chronic illness  INTERVENTION:  TPN to meet nutrition needs, per pharmacy to adjust  NUTRITION DIAGNOSIS:   Moderate Malnutrition related to chronic illness(COPD, CHF) as evidenced by mild muscle depletion, mild fat depletion.  GOAL:   Patient will meet greater than or equal to 90% of their needs   MONITOR:   PO intake, Diet advancement, Labs  REASON FOR ASSESSMENT:   Consult New TPN/TNA  ASSESSMENT:  72 year old male admitted with acute onset abdominal pain and nausea/vomiting. Pt with sepsis secondary to multifocal PNA, ileus, and hyperbilirubinemia. Workup revealed cholestatic liver injury, increased stool burden, and bilateral upper lobe multifocal pneumonia. His hospital course has been complicated by continually worsening bilirubin, persistent abdominal pain, and worsening respiratory status. PMH significant for COPD, GERD, HTN, CHF, osteoporosis, and HLD. Plan to start TPN 10/22/17.  Spoke with pt at bedside briefly as he was drowsy and in and out of sleep. Pt reports abdomin "feels better" today. Per RN charting, pt had a BM this morning. Pt has been consuming between 0-100% of his clear or full liquid meals. Spoke with RN who reports pt has been able to rest more comfortably since BM this morning. Per weight history, pt's weight has been stable between 166-176 lbs since April 2017.  Pharmacy has ordered TPN at 30 ml/hr (goal of 75 ml/hr) which provides 31 g AA, 144 g CHO, and 12 g ILE. Total calories provided are 765 kcal/day.  Medications reviewed and include: Novolog, Protonix, 5% IV dextrose  Labs reviewed: sodium 133 (L), calcium 7.9 (L), alkaline phosphatase 278 (H), AST 109 (H), TG 348 (H)  NUTRITION - FOCUSED PHYSICAL EXAM:    Most Recent Value  Orbital Region  Mild depletion  Upper Arm Region  Mild depletion  Thoracic and Lumbar Region  No depletion   Buccal Region  No depletion  Temple Region  Mild depletion  Clavicle Bone Region  Mild depletion  Clavicle and Acromion Bone Region  Mild depletion  Scapular Bone Region  Unable to assess  Dorsal Hand  No depletion  Patellar Region  Mild depletion  Anterior Thigh Region  Mild depletion  Posterior Calf Region  Mild depletion  Edema (RD Assessment)  None  Hair  Reviewed  Eyes  Reviewed  Mouth  Reviewed  Skin  Reviewed  Nails  Reviewed       Diet Order:  Diet full liquid Room service appropriate? Yes; Fluid consistency: Thin TPN ADULT (ION)  EDUCATION NEEDS:   No education needs have been identified at this time  Skin:  Skin Assessment: Reviewed RN Assessment  Last BM:  10/22/17 small type 6  Height:  Ht Readings from Last 1 Encounters:  10/14/17 5\' 7"  (1.702 m)    Weight:  Wt Readings from Last 1 Encounters:  10/22/17 172 lb 13.5 oz (78.4 kg)    Ideal Body Weight:  67.3 kg  BMI:  Body mass index is 27.07 kg/m.  Estimated Nutritional Needs:   Kcal:  1900-2100 kcal/day  Protein:  95-110 g/day  Fluid:  per MD   Gaynell Face, MS, RD, LDN Pager: [phone] Weekend/After Hours: [phone]

## 2017-10-22 NOTE — Progress Notes (Signed)
Daily Rounding Note  10/22/2017, 8:40 AM  LOS: 8 days   SUBJECTIVE:   Chief complaint: non-focal abdominal pain and bloating persist.  Asking to have an enema, "so I can eat".  Not passing much flatus, no BMs.  Feels cold.     OBJECTIVE:         Vital signs in last 24 hours:    Temp:  [98.4 F (36.9 C)-98.7 F (37.1 C)] 98.6 F (37 C) (01/15 0353) Pulse Rate:  [104-116] 108 (01/15 0200) Resp:  [22-37] 32 (01/15 0200) BP: (118-145)/(62-84) 136/68 (01/15 0200) SpO2:  [86 %-97 %] 92 % (01/15 0200) Weight:  [78.4 kg (172 lb 13.5 oz)-80 kg (176 lb 5.9 oz)] 78.4 kg (172 lb 13.5 oz) (01/15 0400) Last BM Date: 10/19/17 Filed Weights   10/19/17 0535 10/21/17 1407 10/22/17 0400  Weight: 78 kg (171 lb 15.3 oz) 80 kg (176 lb 5.9 oz) 78.4 kg (172 lb 13.5 oz)   General: markedly jaundiced.  Weak.  Comfortable.  Looks worse   Heart: sinus tachy in low 100s.   Chest: mild dyspnea with speach Abdomen: slightly distended.  Mild non-focal tenderness.  BS quiet.    Extremities: no CCE Neuro/Psych:  Oriented x 3.  Not confused.  No tremor.  Moves all 4 limbs.    Intake/Output from previous day: 01/14 0701 - 01/15 0700 In: 712.6 [P.O.:150; I.V.:400.1; IV Piggyback:162.5] Out: 1950 [HUDJS:9702]  Intake/Output this shift: No intake/output data recorded.  Lab Results: Recent Labs    10/20/17 0310 10/21/17 1245 10/22/17 0428  WBC 12.1* 14.7* 16.8*  HGB 11.9* 10.6* 11.0*  HCT 33.4* 30.2* 31.6*  PLT 172 228 277   BMET Recent Labs    10/20/17 0310 10/21/17 1245 10/22/17 0428  NA 133* 130* 133*  K 4.2 3.9 4.0  CL 106 103 103  CO2 18* 17* 18*  GLUCOSE 124* 126* 95  BUN '9 11 12  '$ CREATININE 0.87 0.85 0.94  CALCIUM 8.0* 7.5* 7.9*   LFT Recent Labs    10/20/17 0310 10/21/17 1245 10/21/17 1300 10/22/17 0428  PROT 6.0* 5.8*  --  6.3*  ALBUMIN 1.9* 1.8*  --  1.8*  AST 62* 92*  --  109*  ALT 46 52  --  58  ALKPHOS  131* 209*  --  278*  BILITOT 14.4* 19.4* 19.4* 21.8*  BILIDIR 9.3*  --  13.8* 14.8*  IBILI 5.1*  --  5.6* 7.0*   PT/INR Recent Labs    10/22/17 0428  LABPROT 16.0*  INR 1.29   Hepatitis Panel No results for input(s): HEPBSAG, HCVAB, HEPAIGM, HEPBIGM in the last 72 hours.  Studies/Results: Dg Abd 1 View  Result Date: 10/21/2017 CLINICAL DATA:  Abdominal distension. EXAM: ABDOMEN - 1 VIEW COMPARISON:  Abdominal radiograph 10/19/2017; CT abdomen 10/17/2017 FINDINGS: Interval increase in gaseous distention of the small and large bowel throughout the abdomen. Supine evaluation limited for the detection of free intraperitoneal air. Oral contrast material within the cecum and ascending colon. IMPRESSION: Increased gaseous distention of large and small bowel loops throughout the abdomen raising the possibility of ileus. Electronically Signed   By: Lovey Newcomer M.D.   On: 10/21/2017 15:41   Dg Chest Port 1 View  Result Date: 10/21/2017 CLINICAL DATA:  Cough. EXAM: PORTABLE CHEST 1 VIEW COMPARISON:  Chest x-ray dated October 19, 2017. FINDINGS: Stable cardiomediastinal silhouette. Unchanged diffuse airspace opacities throughout both lungs. No pleural effusion or pneumothorax. No acute osseous  abnormality. IMPRESSION: Unchanged diffuse airspace disease in both lungs. Electronically Signed   By: Titus Dubin M.D.   On: 10/21/2017 15:44   US Liver Doppler  Result Date: 10/22/2017 CLINICAL DATA:  Trauma on this. EXAM: DUPLEX ULTRASOUND OF LIVER TECHNIQUE: Color and duplex Doppler ultrasound was performed to evaluate the hepatic in-flow and out-flow vessels. COMPARISON:  10/17/2017 CT abdomen/pelvis. 10/14/2017 right upper quadrant abdominal sonogram. FINDINGS: Scan is limited by patient dyspnea and inability to breath hold. Scan is also limited by bowel gas. Portal Vein Velocities Main:  23.1 cm/sec Right:  7.3 cm/sec Left:  11.5 cm/sec Hepatic Vein Velocities Right:  18.3 cm/sec Middle:  20.9 cm/sec  Left:  27.5 cm/sec Hepatic Artery Velocity:  85.5 cm/sec Splenic Vein Velocity:  12.7 cm/sec Varices: None. Ascites: None. Patent IVC with appropriate flow direction. Hepatic and portal veins are patent with no evidence of thrombosis and with normal flow directions. Normal size spleen. IMPRESSION: No acute vascular abnormality on this hepatic vascular doppler scan. Portal, splenic and hepatic veins are patent with appropriate flow directions and no evidence of thrombosis. Electronically Signed   By: Ilona Sorrel M.D.   On: 10/22/2017 00:34   Scheduled Meds: . albuterol  2.5 mg Nebulization QID  . budesonide (PULMICORT) nebulizer solution  0.25 mg Nebulization BID  . chlorhexidine  15 mL Mouth Rinse BID  . feeding supplement (GLUCERNA SHAKE)  237 mL Oral TID BM  . gabapentin  300 mg Oral BID  . insulin aspart  0-9 Units Subcutaneous Q6H  . mouth rinse  15 mL Mouth Rinse q12n4p  . pantoprazole  40 mg Oral Daily  . sodium chloride flush  3 mL Intravenous Q12H   Continuous Infusions: . sodium chloride    . dextrose 5 %-0.9% nacl with kcl 125 mL/hr at 10/22/17 0503  . piperacillin-tazobactam (ZOSYN)  IV Stopped (10/22/17 6301)  . 0.9 % sodium chloride with kcl    . TPN ADULT (ION)    . vancomycin 750 mg (10/22/17 0837)   PRN Meds:.sodium chloride, acetaminophen, albuterol, chlorproMAZINE, LORazepam, morphine injection, ondansetron (ZOFRAN) IV, sodium chloride flush  ASSESMENT:   *  Jaundice, intrahepatic cholestasis of undetermined cause.  T bili and alk phos rising. Note elevated EBV VCA and EBV NA IgG so is this convalescing vs past EBV virus?  Decompensated cirrhosis?, though has no coagulopathy.   MELD 20.  . The lab cancelled the urine prophobilinogen ordered on 1/11. "SPECIMEN/CONTAINER TYPE INAPPROPRIATE FOR ORDERED TEST, UNABLE TO PERFORM NOT LIGHT PROTECTED" without notifying any staff.  Have call into lab manager to discuss this.      *  SB and colonic ileus.  Gastroparesis on EGD  1/8.    *  Protein malnutrition, prolonged marginal PO intake.  PICC to be placed to allow TPN to start today.    *  Bil CAP vs HCAP, ? ILD.  Vanc, Zosyn in place.  ID following.  WBCs rising.  No fevers.     PLAN   *   Does pt need liver biopsy?    *  Ordered ammonia level as add on from this AM blood draw.   Reordered the urine porphobilinogen in the proper container that I delivered to staff on [facility] service working on transfer to [facility], have been in touch with GI at [facility] and pt will transfer once a bed is available.       Azucena Freed  10/22/2017, 8:40 AM Pager: [phone]  Attending physician's note   I have taken an interval history, reviewed the chart and examined the patient. I agree with the Advanced Practitioner's note, impression and recommendations.  Bilirubin and alk phos continues to rise with intact liver synthetic function, INR 1.2.  No evidence of ascites or varices, no indication of cirrhosis Intrahepatic cholestasis unclear etiology ?  Drug-induced liver injury .  Infectious workup negative so far. He continues to have significant abdominal distention and tenderness, increased gaseous distention on abdominal x-ray suggestive of ileus. Unclear etiology.  No inflammatory changes or bowel wall thickening based on CT abdomen pelvis with contrast on October 17, 2017.   Damaris Hippo, MD [phone] Mon-Fri 8a-5p [phone] after 5p, weekends, holidays

## 2017-10-22 NOTE — Progress Notes (Signed)
Subjective: Patient doing well this morning. Feels that his abdominal distention and pain are improved. He did pass gas this morning. Still has not had a bowel movement. Overnight he had to go back on BiPAP, and did not sleep restfully. He continues to express frustration with his minimal improvement. We discussed that we are continuing to monitor his blood work. We discussed that we have reached out to [facility] to consider transferring him to their facility and they have made recommendations in the meantime. He agrees with the plan and is still open to being transferred. As for now we will continue medical management and workup. He is asking for an enema to help him have a bowel movement. All questions and concerns addressed.  Objective: Vital signs in last 24 hours: Vitals:   10/22/17 0100 10/22/17 0200 10/22/17 0353 10/22/17 0400  BP: 125/68 136/68    Pulse: (!) 108 (!) 108    Resp: (!) 31 (!) 32    Temp:   98.6 F (37 C)   TempSrc:   Oral   SpO2: (!) 89% 92%    Weight:    172 lb 13.5 oz (78.4 kg)  Height:       General: Well-nourished male, appears worn down Pulm: Tachypnea, faint inspiratory crackles CV: Distant heart sounds, tachycardic, no murmurs or rubs Abdomen: Hypoactive bowel sounds, diffuse tenderness to palpation, distended Extremities: No lower extremity edema Skin: Warm and dry, jaundice Neuro: Patient appears to be having difficulty concentrating today.  Assessment/Plan:  [name] is a 72 year old maleadmitted 1/7/19with acute onset abdominal pain and nausea/vomiting.Workup revealed cholestatic liver injury, increased stool burden, and bilateral upper lobe multifocal pneumonia. His hospital course has been complicated by continually worsening bilirubin, persistent abdominal pain and worsening respiratory status. He is currently on heated high flow nasal cannula and intermittent BiPAP  Multifocal PNA He is now off BiPAP but continues to require HFNC  11-12L/min to maintain oxygen saturations >90%. He is otherwise currently hemodynamically stable. ABG illustrating elevated A-a gradient likely from underlying pneumonia. Blood cultures with no growth to date. Sputum cultures did grow Candida Tropicalis; however, this is felt to be a colonizer.  - Leukocytosis continues to increase - Elevated IgE, aspergillosis antibodies pending - ANA negative  - Continuing Vanc and Zosyn. Levaquin discontinued on 1/14 - Appreciate IDs recommendations.   Ileus  - EGD 1/8 with retained food c/w gastroparesis - Repeat abdominal x-ray on 1/14 showing increased gaseous distention in the small and large bowel consistent with ileus.  - Avoid opiates if possible  - Maintain potassium >4.0  - Fleet enemas as needed - Appreciate GIs recommendations  Hyperbilirubinemia 2/2 ? liver parenchymal disease His bilirubin continues to rise daily with direct > indirect. This suggests a post-hepatic process. Dr. Rebeca Alert spoke with GI at [facility] about possibly transferring the patient for further work-up/management. At this point no beds are available but they thought that the most likely etiology is liver parenchymal disease. Recommended abdominal US and repeat abdominal CT. [place] did not show any vascular abnormalities but was limited by gaseous distention and patients dyspnea.   - Antimitochondrial antibodies, ANCA, porphobilinogen, AFB, and aspergillus pending - Appreciate GI recommendation - Will discuss with [facility] about transfer within the next day or so   Normocytic Anemia and Thrombocytopenia - Thrombocytopenia resolved  - Anemia stable   Sinus Tachycardia Reviewed patient's telemetry. He remained tachycardic throughout the night.  Dispo: Anticipated discharge pending clinical improvement were transferred to [facility] if bed  becomes available.   Ina Homes, MD 10/22/2017, 6:21 AM My Pager: [phone]

## 2017-10-22 NOTE — Progress Notes (Addendum)
PHARMACY - ADULT TOTAL PARENTERAL NUTRITION CONSULT NOTE   Pharmacy Consult:  TPN Indication:  Possible ileus  Patient Measurements: Height: _0  (170.2 cm) Weight: 172 lb 13.5 oz (78.4 kg) IBW/kg (Calculated) : 66.1 TPN AdjBW (KG): 76.9 Body mass index is 27.07 kg/m.  Assessment:  63 YOM presented on 10/14/17 with abdominal pain.  10/15/17 UGI endoscopy revealed gastroparesis that did not explain transaminitis and hyperbilirubinemia.  Patient has been on a liquid diet, but his intake has been minimal to none since admission.  Per documentation, abdominal pain is worse when he drinks more than a few sips; therefore, Pharmacy consulted to manage TPN for nutritional support.  Spoke to GI, MD concerned that patient will not tolerate post-pyloric feeding and there may be a component of an ileus.  Patient is sleeping soundly.  He was eating regularly before admission based on H&P.  GI: GERD on PPI PO.  Baseline prealbumin <5 Endo: no hx DM - AM glucose controlled Insulin requirements in the past 24 hours: N/A Lytes: all WNL except low Na and CO2 Renal: SCr 0.85, BUN WNL - UOP 1 ml/kg/hr, D5NS 30K at 125 ml/hr Pulm: COPD / tobacco - HFNC - albuterol, Pulmicort.  May need bronch Cards: HTN / HLD, AFib on 1/10 and now in SVT - BP controlled, tachy Hepatobil: suspects intrahepatic cholestasis, U/S negative - alk phos up 278, AST up 109, tbili 21.8.  TG elevated at 348 (reduced lipid in TPN) Neuro: gabapentin ID: [id number] D#8 - Zosyn/Vanc for PNA - afebrile, WBC up to 16.8 TPN Access: PICC to be ordered by GI TPN start date: 10/22/17  Nutritional Goals (RD rec pending): 1900-2000 kCal and 90-115gm protein per day  Current Nutrition:  Full liquid diet vs NPO (no PO intake) Glucerna TID (none given)   Plan:  Initiate TPN at 30 ml/hr (goal ~75 ml/hr), providing 31g AA, 144gm CHO, and 12gm ILE, which equals to 765 kCal, meeting ~34% of needs. Electrolytes in TPN: standard, Cl:Ac 1:2 Daily  multivitamin in TPN Remove trace elements d/t elevated bilirubin - add back zinc 75m, chromium 165m and selenium 6027mChange IVF to NS 30K and reduce to 95 ml/hr when TPN starts Start sensitive SSI Q6H F/U AM labs   Dimitria Ketchum D. DanMina MarbleharmD, BCPS Pager:  [phone]/2019, 7:33 AM    =========================================   Addendum:  Spoke to primary team regarding PICC placement for TPN administration.  Per MD, hold off on placing a PICC because patient may be transferred.  Will not prepare a TPN bag for today.   Zoie Sarin D. DanMina MarbleharmD, BCPS Pager:  [phone]/2019, 9:56 AM

## 2017-10-22 NOTE — Progress Notes (Signed)
Patient discharged for transfer to [facility] via [facility].  Patient valuables were retrieved from security and given to patient, all belongings sent with patient including cellphone and eyeglasses.

## 2017-10-23 ENCOUNTER — Telehealth: Payer: Self-pay

## 2017-10-23 ENCOUNTER — Other Ambulatory Visit: Payer: Self-pay | Admitting: Pharmacist

## 2017-10-23 DIAGNOSIS — J449 Chronic obstructive pulmonary disease, unspecified: Secondary | ICD-10-CM

## 2017-10-23 LAB — HAPTOGLOBIN: Haptoglobin: 282 mg/dL — ABNORMAL HIGH (ref 34–200)

## 2017-10-23 LAB — ANCA TITERS
Atypical P-ANCA titer: <1:20 {titer}
C-ANCA: <1:20 {titer}
P-ANCA: 1:160 {titer} — ABNORMAL HIGH

## 2017-10-23 LAB — ANTI-SMOOTH MUSCLE ANTIBODY, IGG: F-Actin IgG: 13 Units (ref 0–19)

## 2017-10-23 LAB — PORPHOBILINOGEN, RANDOM URINE: Quantitative Porphobilinogen: 0.6 mg/L (ref 0.0–2.0)

## 2017-10-23 MED ORDER — GENERIC EXTERNAL MEDICATION
1.25 | Status: DC
Start: 2017-10-23 — End: 2017-10-23

## 2017-10-23 MED ORDER — GENERIC EXTERNAL MEDICATION
2.00 | Status: DC
Start: 2017-10-23 — End: 2017-10-23

## 2017-10-23 MED ORDER — ENOXAPARIN SODIUM 40 MG/0.4ML ~~LOC~~ SOLN
40.00 | SUBCUTANEOUS | Status: DC
Start: 2017-11-08 — End: 2017-10-23

## 2017-10-23 MED ORDER — ALBUTEROL SULFATE (5 MG/ML) 0.5% IN NEBU
2.50 | INHALATION_SOLUTION | RESPIRATORY_TRACT | Status: DC
Start: ? — End: 2017-10-23

## 2017-10-23 MED ORDER — GABAPENTIN 300 MG PO CAPS
300.00 | ORAL_CAPSULE | ORAL | Status: DC
Start: 2017-11-04 — End: 2017-10-23

## 2017-10-23 MED ORDER — HYDROMORPHONE HCL 1 MG/ML IJ SOLN
.50 | INTRAMUSCULAR | Status: DC
Start: ? — End: 2017-10-23

## 2017-10-23 MED ORDER — PANTOPRAZOLE SODIUM 40 MG PO TBEC
40.00 | DELAYED_RELEASE_TABLET | ORAL | Status: DC
Start: 2017-10-25 — End: 2017-10-23

## 2017-10-23 MED ORDER — TIOTROPIUM BROMIDE MONOHYDRATE 18 MCG IN CAPS: 18.0000 ug | ORAL_CAPSULE | Freq: Every day | RESPIRATORY_TRACT | 2 refills | Status: DC

## 2017-10-23 MED ORDER — ONDANSETRON HCL 4 MG/2ML IJ SOLN
4.00 | INTRAMUSCULAR | Status: DC
Start: ? — End: 2017-10-23

## 2017-10-23 MED ORDER — GENERIC EXTERNAL MEDICATION
Status: DC
Start: ? — End: 2017-10-23

## 2017-10-23 NOTE — Telephone Encounter (Signed)
-----   Message from Juanito Doom, MD sent at 10/18/2017  4:14 PM EST ----- Hi, I would like for him to see me or an NP in a 30 min Hosp FU slot in 2-4 weeks Thanks, B

## 2017-10-23 NOTE — Progress Notes (Signed)
Spiriva refill.

## 2017-10-23 NOTE — Telephone Encounter (Signed)
atc pt to schedule HFU, no answer and VM full.  Only 1 number on file for pt.  Wcb.

## 2017-10-24 LAB — CULTURE, BLOOD (ROUTINE X 2)
Culture: NO GROWTH
Culture: NO GROWTH
Special Requests: ADEQUATE
Special Requests: ADEQUATE

## 2017-10-24 LAB — ASPERGILLUS ANTIBODY BY IMMUNODIFF
Aspergillus flavus: NEGATIVE
Aspergillus fumigatus, IgG: NEGATIVE
Aspergillus niger: NEGATIVE

## 2017-10-24 LAB — FUNGITELL, SERUM: Fungitell Result: <31 pg/mL (ref <80)

## 2017-10-24 MED ORDER — GENERIC EXTERNAL MEDICATION
1.00 | Status: DC
Start: 2017-10-26 — End: 2017-10-24

## 2017-10-24 MED ORDER — OXYCODONE HCL 5 MG PO TABS
5.00 | ORAL_TABLET | ORAL | Status: DC
Start: ? — End: 2017-10-24

## 2017-10-24 MED ORDER — TIOTROPIUM BROMIDE MONOHYDRATE 18 MCG IN CAPS
18.00 | ORAL_CAPSULE | RESPIRATORY_TRACT | Status: DC
Start: 2017-11-09 — End: 2017-10-24

## 2017-10-24 MED ORDER — GENERIC EXTERNAL MEDICATION
0.75 | Status: DC
Start: 2017-10-24 — End: 2017-10-24

## 2017-10-24 MED ORDER — GENERIC EXTERNAL MEDICATION
Status: DC
Start: ? — End: 2017-10-24

## 2017-10-25 LAB — ASPERGILLUS ANTIBODY BY IMMUNODIFF
Aspergillus flavus: NEGATIVE
Aspergillus fumigatus, IgG: NEGATIVE
Aspergillus niger: NEGATIVE

## 2017-10-25 LAB — IMMUNOGLOBULINS A/E/G/M, SERUM
IgA: 500 mg/dL — ABNORMAL HIGH (ref 61–437)
IgE (Immunoglobulin E), Serum: 685 IU/mL — ABNORMAL HIGH (ref 0–100)
IgG (Immunoglobin G), Serum: 1045 mg/dL (ref 700–1600)
IgM (Immunoglobulin M), Srm: 98 mg/dL (ref 15–143)

## 2017-10-25 LAB — FUNGAL ANTIBODIES PANEL, ID-BLOOD
Aspergillus flavus: NEGATIVE
Aspergillus fumigatus, IgG: NEGATIVE
Aspergillus niger: NEGATIVE
Blastomyces Abs, Qn, DID: NEGATIVE
Histoplasma Ab, Immunodiffusion: NEGATIVE

## 2017-10-25 MED ORDER — POTASSIUM CHLORIDE 40 MEQ/100ML IV SOLN
40.00 | INTRAVENOUS | Status: DC
Start: 2017-10-25 — End: 2017-10-25

## 2017-10-25 MED ORDER — GENERIC EXTERNAL MEDICATION
250.00 | Status: DC
Start: 2017-10-25 — End: 2017-10-25

## 2017-10-25 MED ORDER — METOCLOPRAMIDE HCL 5 MG/ML IJ SOLN
10.00 | INTRAMUSCULAR | Status: DC
Start: 2017-10-25 — End: 2017-10-25

## 2017-10-29 MED ORDER — FUROSEMIDE 20 MG PO TABS
20.00 | ORAL_TABLET | ORAL | Status: DC
Start: 2017-10-30 — End: 2017-10-29

## 2017-10-29 MED ORDER — ONDANSETRON 4 MG PO TBDP
4.00 | ORAL_TABLET | ORAL | Status: DC
Start: ? — End: 2017-10-29

## 2017-10-29 MED ORDER — TAMSULOSIN HCL 0.4 MG PO CAPS
0.40 | ORAL_CAPSULE | ORAL | Status: DC
Start: 2017-11-05 — End: 2017-10-29

## 2017-10-29 MED ORDER — GUAIFENESIN ER 600 MG PO TB12
600.00 | ORAL_TABLET | ORAL | Status: DC
Start: 2017-11-04 — End: 2017-10-29

## 2017-10-29 MED ORDER — ALBUTEROL SULFATE (5 MG/ML) 0.5% IN NEBU
2.50 | INHALATION_SOLUTION | RESPIRATORY_TRACT | Status: DC
Start: 2017-11-04 — End: 2017-10-29

## 2017-10-29 MED ORDER — AQUADEKS PO CHEW
2.00 | CHEWABLE_TABLET | ORAL | Status: DC
Start: 2017-10-30 — End: 2017-10-29

## 2017-10-30 ENCOUNTER — Encounter: Payer: Medicare Other | Admitting: Internal Medicine

## 2017-11-01 ENCOUNTER — Encounter: Payer: Medicare Other | Admitting: Internal Medicine

## 2017-11-04 MED ORDER — POLYETHYLENE GLYCOL 3350 17 G PO PACK
17.00 | PACK | ORAL | Status: DC
Start: 2017-11-05 — End: 2017-11-04

## 2017-11-04 MED ORDER — NITROGLYCERIN 0.4 MG SL SUBL
0.40 | SUBLINGUAL_TABLET | SUBLINGUAL | Status: DC
Start: ? — End: 2017-11-04

## 2017-11-04 MED ORDER — MULTI-VITAMINS PO TABS
1.00 | ORAL_TABLET | ORAL | Status: DC
Start: 2017-11-05 — End: 2017-11-04

## 2017-11-04 MED ORDER — SENNOSIDES-DOCUSATE SODIUM 8.6-50 MG PO TABS
2.00 | ORAL_TABLET | ORAL | Status: DC
Start: 2017-11-04 — End: 2017-11-04

## 2017-11-04 MED ORDER — MORPHINE SULFATE (PF) 4 MG/ML IV SOLN
2.00 | INTRAVENOUS | Status: DC
Start: ? — End: 2017-11-04

## 2017-11-04 MED ORDER — OXYCODONE HCL 5 MG PO TABS
2.50 | ORAL_TABLET | ORAL | Status: DC
Start: ? — End: 2017-11-04

## 2017-11-06 LAB — ACID FAST SMEAR (AFB): ACID FAST SMEAR - AFSCU2: NEGATIVE

## 2017-11-08 MED ORDER — POLYETHYLENE GLYCOL 3350 17 G PO PACK
17.00 | PACK | ORAL | Status: DC
Start: 2017-11-09 — End: 2017-11-08

## 2017-11-08 MED ORDER — DEXTROSE 10 % IV SOLN
50.00 | INTRAVENOUS | Status: DC
Start: ? — End: 2017-11-08

## 2017-11-08 MED ORDER — GUAIFENESIN 100 MG/5ML PO SYRP
200.00 | ORAL_SOLUTION | ORAL | Status: DC
Start: ? — End: 2017-11-08

## 2017-11-08 MED ORDER — GABAPENTIN 300 MG/6ML PO SOLN
300.00 | ORAL | Status: DC
Start: 2017-11-08 — End: 2017-11-08

## 2017-11-14 ENCOUNTER — Other Ambulatory Visit: Payer: Self-pay | Admitting: Internal Medicine

## 2017-11-14 NOTE — Progress Notes (Signed)
Contacted by micro lab that the patient's AFB came back positive; however, was not MTB. Patient has since been discharged from the hospital. Spoke with infectious disease to determine if the patient should be placed on contact precautions. He does NOT need to be on contact precautions. The lab has sent the culture off for further analysis via PCR to identify the organism. We will continue to follow the results.   ID did recommend repeating a CT chest at the patient's next follow-up visit. Pending results he may need further evaluation. I will continue to follow the patient for lab results and notify the patient's PCP, Dr. Reesa Chew.

## 2017-11-18 DIAGNOSIS — M4854XD Collapsed vertebra, not elsewhere classified, thoracic region, subsequent encounter for fracture with routine healing: Secondary | ICD-10-CM | POA: Diagnosis not present

## 2017-11-18 DIAGNOSIS — E440 Moderate protein-calorie malnutrition: Secondary | ICD-10-CM | POA: Diagnosis not present

## 2017-11-18 DIAGNOSIS — I10 Essential (primary) hypertension: Secondary | ICD-10-CM | POA: Diagnosis not present

## 2017-11-18 DIAGNOSIS — R0989 Other specified symptoms and signs involving the circulatory and respiratory systems: Secondary | ICD-10-CM | POA: Diagnosis not present

## 2017-11-18 DIAGNOSIS — Z9981 Dependence on supplemental oxygen: Secondary | ICD-10-CM | POA: Diagnosis not present

## 2017-11-18 DIAGNOSIS — R2681 Unsteadiness on feet: Secondary | ICD-10-CM | POA: Diagnosis not present

## 2017-11-18 DIAGNOSIS — N200 Calculus of kidney: Secondary | ICD-10-CM | POA: Diagnosis not present

## 2017-11-18 DIAGNOSIS — Z452 Encounter for adjustment and management of vascular access device: Secondary | ICD-10-CM | POA: Diagnosis not present

## 2017-11-18 DIAGNOSIS — Z789 Other specified health status: Secondary | ICD-10-CM | POA: Diagnosis not present

## 2017-11-18 DIAGNOSIS — R0902 Hypoxemia: Secondary | ICD-10-CM | POA: Diagnosis not present

## 2017-11-18 DIAGNOSIS — M810 Age-related osteoporosis without current pathological fracture: Secondary | ICD-10-CM | POA: Diagnosis not present

## 2017-11-18 DIAGNOSIS — M8000XA Age-related osteoporosis with current pathological fracture, unspecified site, initial encounter for fracture: Secondary | ICD-10-CM | POA: Diagnosis not present

## 2017-11-18 DIAGNOSIS — J9611 Chronic respiratory failure with hypoxia: Secondary | ICD-10-CM | POA: Diagnosis not present

## 2017-11-18 DIAGNOSIS — E43 Unspecified severe protein-calorie malnutrition: Secondary | ICD-10-CM | POA: Diagnosis not present

## 2017-11-18 DIAGNOSIS — R918 Other nonspecific abnormal finding of lung field: Secondary | ICD-10-CM | POA: Diagnosis not present

## 2017-11-18 DIAGNOSIS — K8050 Calculus of bile duct without cholangitis or cholecystitis without obstruction: Secondary | ICD-10-CM | POA: Diagnosis not present

## 2017-11-18 DIAGNOSIS — G8929 Other chronic pain: Secondary | ICD-10-CM | POA: Diagnosis not present

## 2017-11-18 DIAGNOSIS — J9621 Acute and chronic respiratory failure with hypoxia: Secondary | ICD-10-CM | POA: Diagnosis not present

## 2017-11-18 DIAGNOSIS — J449 Chronic obstructive pulmonary disease, unspecified: Secondary | ICD-10-CM | POA: Diagnosis not present

## 2017-11-18 DIAGNOSIS — S01301D Unspecified open wound of right ear, subsequent encounter: Secondary | ICD-10-CM | POA: Diagnosis not present

## 2017-11-18 DIAGNOSIS — D649 Anemia, unspecified: Secondary | ICD-10-CM | POA: Diagnosis not present

## 2017-11-18 DIAGNOSIS — E785 Hyperlipidemia, unspecified: Secondary | ICD-10-CM | POA: Diagnosis not present

## 2017-11-18 DIAGNOSIS — Z7401 Bed confinement status: Secondary | ICD-10-CM | POA: Diagnosis not present

## 2017-11-18 DIAGNOSIS — M545 Low back pain: Secondary | ICD-10-CM | POA: Diagnosis not present

## 2017-11-18 DIAGNOSIS — J9601 Acute respiratory failure with hypoxia: Secondary | ICD-10-CM | POA: Diagnosis not present

## 2017-11-18 DIAGNOSIS — J849 Interstitial pulmonary disease, unspecified: Secondary | ICD-10-CM | POA: Diagnosis not present

## 2017-11-18 DIAGNOSIS — J80 Acute respiratory distress syndrome: Secondary | ICD-10-CM | POA: Diagnosis not present

## 2017-11-18 DIAGNOSIS — G43909 Migraine, unspecified, not intractable, without status migrainosus: Secondary | ICD-10-CM | POA: Diagnosis not present

## 2017-11-18 DIAGNOSIS — M6281 Muscle weakness (generalized): Secondary | ICD-10-CM | POA: Diagnosis not present

## 2017-11-18 DIAGNOSIS — R092 Respiratory arrest: Secondary | ICD-10-CM | POA: Diagnosis not present

## 2017-11-18 DIAGNOSIS — S01301A Unspecified open wound of right ear, initial encounter: Secondary | ICD-10-CM | POA: Diagnosis not present

## 2017-11-18 DIAGNOSIS — M4856XD Collapsed vertebra, not elsewhere classified, lumbar region, subsequent encounter for fracture with routine healing: Secondary | ICD-10-CM | POA: Diagnosis not present

## 2017-11-18 DIAGNOSIS — R1312 Dysphagia, oropharyngeal phase: Secondary | ICD-10-CM | POA: Diagnosis not present

## 2017-11-18 DIAGNOSIS — Z720 Tobacco use: Secondary | ICD-10-CM | POA: Diagnosis not present

## 2017-11-18 LAB — ORG ID BY SEQUENCING RFLX AST

## 2017-11-18 LAB — AFB ORGANISM ID BY DNA PROBE
M TUBERCULOSIS COMPLEX: NEGATIVE
M avium complex: NEGATIVE

## 2017-11-18 LAB — RAPID GROWER BROTH SUSCEP.
Amikacin: 2
Clarithromycin: >16
Doxycycline: >16
Linezolid: 4
Minocycline: 8
Moxifloxacin: >8

## 2017-11-18 LAB — ACID FAST CULTURE WITH REFLEXED SENSITIVITIES

## 2017-11-18 LAB — ORGANISM ID BY SEQUENCING 2

## 2017-11-18 LAB — ACID FAST CULTURE WITH REFLEXED SENSITIVITIES (MYCOBACTERIA): Acid Fast Culture: POSITIVE — AB

## 2017-11-19 ENCOUNTER — Non-Acute Institutional Stay (SKILLED_NURSING_FACILITY): Payer: Medicare Other | Admitting: Internal Medicine

## 2017-11-19 ENCOUNTER — Encounter: Payer: Self-pay | Admitting: Internal Medicine

## 2017-11-19 DIAGNOSIS — J9601 Acute respiratory failure with hypoxia: Secondary | ICD-10-CM | POA: Diagnosis not present

## 2017-11-19 DIAGNOSIS — R0989 Other specified symptoms and signs involving the circulatory and respiratory systems: Secondary | ICD-10-CM

## 2017-11-19 DIAGNOSIS — E440 Moderate protein-calorie malnutrition: Secondary | ICD-10-CM

## 2017-11-19 DIAGNOSIS — K8050 Calculus of bile duct without cholangitis or cholecystitis without obstruction: Secondary | ICD-10-CM

## 2017-11-19 NOTE — Assessment & Plan Note (Addendum)
General surgery follow-up after release from the SNF He desires his PCP to make the referral

## 2017-11-19 NOTE — Assessment & Plan Note (Signed)
Carotid Dopplers through his PCP 10/15/17 HDL 24 and LDL 50

## 2017-11-19 NOTE — Progress Notes (Signed)
NURSING HOME LOCATION:  [facility] ROOM NUMBER:  [id number]  CODE STATUS:  Full Code  PCP:  Lorella Nimrod, MD [place] [id number]   This is a comprehensive admission note to [facility] performed on this date less than 30 days from date of admission. Included are preadmission medical/surgical history;reconciled medication list; family history; social history and comprehensive review of systems.  Corrections and additions to the records were documented . Comprehensive physical exam was also performed. Additionally a clinical summary was entered for each active diagnosis pertinent to this admission in the Problem List to enhance continuity of care.  HPI: The patient was transferred to [facility] 10/22/17 from [facility]. He had been an inpatient 1/8-1/15. He been admitted with diffuse abdominal pain and vomiting and cough productive of pink tinged sputum. Abdomen was distended, firm, and tender. There was no passage of gas or stool. Imaging revealed gaseous distention of the intestines. Liver function tests were dramatically elevated with AST of 469, ALT of 241, alkaline phosphatase of 205,total bilirubin of 3.9, and lipase of 23. White count was elevated to 11,700. The was no anemia. He received Zosyn for possible acute cholangitis. Endoscopy revealed retained food in the stomach without other pathology. Gaseous abdominal distention improved with multiple enemas. LFTs improved except for bilirubin which continue to rise. Leukocytosis resolved but thrombocytopenia occurred with platelet counts as low as 79,000. He did develop a mild anemia with hemoglobin 11.4. Temp rose as high as 102.8 & he had progressive pulmonary consolidations with hypoxia. BiPAP and HFNC was required. Repeat CT suggested cirrhosis as well as mild intrahepatic biliary duct dilation. Clinically and radiographically prolonged ileus was suggested. The bilirubin continued to rise up to a high of 21.8 on the day of  transfer. Protein caloric malnutrition was suggested by an albumin of 1.8 Cause of the pulmonary and hepatic abnormalities could not be defined. Hepatitis viral pattern, HIV, CMV, EBV, Legionella, cultures of blood and sputum, sputum AFB, sputum cytology, and respiratory viral panel were all negative. ANA was negative, ANCA was pending. Antimitochondrial antibody was positive at 29:1. IgE was elevated at 678. He remained at [facility] from 1/15-2/11/19. Final diagnosis was choledocholithiasis with obstruction and unspecified interstitial lung disease.  With diuresis FiO2 improved. Initially at the time of transfer he was on 14 L nasal cannula. He was felt to have ARDS in context of hyperbilirubinemia/sepsis. Elective intubation was performed  1/18 to allow ERCP. Sphincterotomy and clearance of debris was accomplished. He was extubated the next day. Oxygen was able to be weaned and at discharge he was on 3.5 L at rest 7 L with exertion. Foley catheter was placed 1/31 for urinary retention. This was able to be removed 2/9. Tube feeding were required because of recurrent aspiration. Subsequently he was able to be advanced to regular diet with thin liquids with chin tuck technique. He has chronic pain and was receiving gabapentin 300 mg twice a day. Hypertension was controlled off medications. Mirtazapine was given for appetite stimulation The most recent labs were 11/14/17. Hemoglobin was 11.5 and hematocrit 35.9. Platelet count was normal. Renal function was normal. Glucose is 120 and calcium 8.8. Outpatient pulmonary assessment was to be pursued. AFB culture was +10/22/17; AMI & MTB are negative. Follow-up with general surgery for possible elective cholecystectomy for gallstones was also to be pursued.  Past medical and surgical history:includes colonic tubular adenoma, hx alcoholism,HTN,& GERD. Anterior cx fusion , EGD & colonoscopy.  Social history: He states he  began smoking at age 46 & smoked a pack a day  until the last several years. He said he cut down to a pack per week. He has a 10th grade education butattended [employer]. 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  Family history: Reviewed; strong hx MI  Review of systems: He has intermittent frontal headaches which respond to Tylenol. He has intermittent dysphagia as well as constipation. Shortness of breath has improved. He states he lost 40 pounds with the current illness.  Constitutional: No fever Eyes: No redness, discharge, pain, vision change ENT/mouth: No nasal congestion,  purulent discharge, earache, change in hearing, sore throat  Cardiovascular: No chest pain, palpitations, paroxysmal nocturnal  dyspnea, claudication, edema  Respiratory: No cough, sputum production, hemoptysis, significant snoring, apnea   Gastrointestinal: No heartburn, abdominal pain, nausea /vomiting, rectal bleeding, melena Genitourinary: No dysuria,hematuria, pyuria, incontinence, nocturia Musculoskeletal: No joint stiffness, joint swelling, weakness, pain Dermatologic: No rash, pruritus, change in appearance of skin Neurologic: No dizziness,syncope, seizures, numbness, tingling Psychiatric: No significant anxiety, depression, insomnia, anorexia Endocrine: No change in hair/skin/ nails, excessive thirst, excessive hunger, excessive urination  Hematologic/lymphatic: No significant bruising, lymphadenopathy,abnormal bleeding Allergy/immunology: No itchy/watery eyes, significant sneezing, urticaria, angioedema  Physical exam:  Pertinent or positive findings: He has a mustache and beard. He has pattern alopecia. His hair is disheveled. There is ptosis on the left. He is edentulous and does not wear dentures.On nasal O2. He has dry rales at the bases. First heart sound is slightly increased. Right carotid bruit is heard. Dorsalis pedis pulses are decreased. He has limb atrophy but exhibits good strength to  opposition.  General appearance: Adequately nourished; no acute distress, increased work of breathing is present.   Lymphatic: No lymphadenopathy about the head, neck, axilla. Eyes: No conjunctival inflammation or lid edema is present. There is no scleral icterus. Ears:  External ear exam shows no significant lesions or deformities.   Nose:  External nasal examination shows no deformity or inflammation. Nasal mucosa are pink and moist without lesions, exudates Oral exam: Lips and gums are healthy appearing. Ther e is no oropharyngeal erythema or exudate. Neck:  No thyromegaly, masses, tenderness noted.    Heart:  Normal rate and regular rhythm. S2 normal without gallop, murmur, click, rub.  Lungs:  without wheezes, rhonchi, rubs. Abdomen:Bowel sounds are normal. Abdomen is soft and nontender with no organomegaly, hernias, masses. GU: Deferred  Extremities:  No cyanosis, clubbing, edema  Neurologic exam:  Strength equal  in upper & lower extremities Balance, Rhomberg, finger to nose testing could not be completed due to clinical state Deep tendon reflexes are equal Skin: Warm & dry w/o tenting. No significant lesions or rash.  See clinical summary under each active problem in the Problem List with associated updated therapeutic plan ad

## 2017-11-19 NOTE — Assessment & Plan Note (Addendum)
40 # weight loss 1/8-2/12/19 in context of complicated multiorgan dysfunction Nutrition consult at [facility]

## 2017-11-19 NOTE — Assessment & Plan Note (Addendum)
Outpatient pulmonary follow-up He wants to arrange this through his PCP @ [facility]

## 2017-11-20 ENCOUNTER — Encounter: Payer: Self-pay | Admitting: Internal Medicine

## 2017-11-20 NOTE — Patient Instructions (Signed)
See assessment and plan under each diagnosis in the problem list and acutely for this visit 

## 2017-11-27 LAB — CBC AND DIFFERENTIAL
HEMATOCRIT: 32 — AB (ref 41–53)
Hemoglobin: 11.3 — AB (ref 13.5–17.5)
NEUTROS ABS: 6
PLATELETS: 246 (ref 150–399)
WBC: 8.6

## 2017-12-02 DIAGNOSIS — S01301A Unspecified open wound of right ear, initial encounter: Secondary | ICD-10-CM | POA: Diagnosis not present

## 2017-12-09 DIAGNOSIS — S01301D Unspecified open wound of right ear, subsequent encounter: Secondary | ICD-10-CM | POA: Diagnosis not present

## 2017-12-16 DIAGNOSIS — S01301D Unspecified open wound of right ear, subsequent encounter: Secondary | ICD-10-CM | POA: Diagnosis not present

## 2017-12-18 DIAGNOSIS — E43 Unspecified severe protein-calorie malnutrition: Secondary | ICD-10-CM | POA: Diagnosis not present

## 2017-12-18 DIAGNOSIS — J449 Chronic obstructive pulmonary disease, unspecified: Secondary | ICD-10-CM | POA: Diagnosis not present

## 2017-12-18 DIAGNOSIS — Z4800 Encounter for change or removal of nonsurgical wound dressing: Secondary | ICD-10-CM | POA: Diagnosis not present

## 2017-12-18 DIAGNOSIS — R1312 Dysphagia, oropharyngeal phase: Secondary | ICD-10-CM | POA: Diagnosis not present

## 2017-12-18 DIAGNOSIS — J9611 Chronic respiratory failure with hypoxia: Secondary | ICD-10-CM | POA: Diagnosis not present

## 2017-12-18 DIAGNOSIS — I10 Essential (primary) hypertension: Secondary | ICD-10-CM | POA: Diagnosis not present

## 2017-12-18 DIAGNOSIS — J849 Interstitial pulmonary disease, unspecified: Secondary | ICD-10-CM | POA: Diagnosis not present

## 2017-12-18 DIAGNOSIS — S41101D Unspecified open wound of right upper arm, subsequent encounter: Secondary | ICD-10-CM | POA: Diagnosis not present

## 2017-12-19 DIAGNOSIS — S41101D Unspecified open wound of right upper arm, subsequent encounter: Secondary | ICD-10-CM | POA: Diagnosis not present

## 2017-12-19 DIAGNOSIS — J9611 Chronic respiratory failure with hypoxia: Secondary | ICD-10-CM | POA: Diagnosis not present

## 2017-12-19 DIAGNOSIS — I10 Essential (primary) hypertension: Secondary | ICD-10-CM | POA: Diagnosis not present

## 2017-12-19 DIAGNOSIS — R1312 Dysphagia, oropharyngeal phase: Secondary | ICD-10-CM | POA: Diagnosis not present

## 2017-12-19 DIAGNOSIS — J449 Chronic obstructive pulmonary disease, unspecified: Secondary | ICD-10-CM | POA: Diagnosis not present

## 2017-12-19 DIAGNOSIS — E43 Unspecified severe protein-calorie malnutrition: Secondary | ICD-10-CM | POA: Diagnosis not present

## 2017-12-20 DIAGNOSIS — R1312 Dysphagia, oropharyngeal phase: Secondary | ICD-10-CM | POA: Diagnosis not present

## 2017-12-20 DIAGNOSIS — E43 Unspecified severe protein-calorie malnutrition: Secondary | ICD-10-CM | POA: Diagnosis not present

## 2017-12-20 DIAGNOSIS — S41101D Unspecified open wound of right upper arm, subsequent encounter: Secondary | ICD-10-CM | POA: Diagnosis not present

## 2017-12-20 DIAGNOSIS — J9611 Chronic respiratory failure with hypoxia: Secondary | ICD-10-CM | POA: Diagnosis not present

## 2017-12-20 DIAGNOSIS — I10 Essential (primary) hypertension: Secondary | ICD-10-CM | POA: Diagnosis not present

## 2017-12-20 DIAGNOSIS — J449 Chronic obstructive pulmonary disease, unspecified: Secondary | ICD-10-CM | POA: Diagnosis not present

## 2017-12-23 DIAGNOSIS — S41101D Unspecified open wound of right upper arm, subsequent encounter: Secondary | ICD-10-CM | POA: Diagnosis not present

## 2017-12-23 DIAGNOSIS — I10 Essential (primary) hypertension: Secondary | ICD-10-CM | POA: Diagnosis not present

## 2017-12-23 DIAGNOSIS — J9611 Chronic respiratory failure with hypoxia: Secondary | ICD-10-CM | POA: Diagnosis not present

## 2017-12-23 DIAGNOSIS — E43 Unspecified severe protein-calorie malnutrition: Secondary | ICD-10-CM | POA: Diagnosis not present

## 2017-12-23 DIAGNOSIS — J449 Chronic obstructive pulmonary disease, unspecified: Secondary | ICD-10-CM | POA: Diagnosis not present

## 2017-12-23 DIAGNOSIS — R1312 Dysphagia, oropharyngeal phase: Secondary | ICD-10-CM | POA: Diagnosis not present

## 2017-12-24 DIAGNOSIS — J449 Chronic obstructive pulmonary disease, unspecified: Secondary | ICD-10-CM | POA: Diagnosis not present

## 2017-12-24 DIAGNOSIS — E43 Unspecified severe protein-calorie malnutrition: Secondary | ICD-10-CM | POA: Diagnosis not present

## 2017-12-24 DIAGNOSIS — J9611 Chronic respiratory failure with hypoxia: Secondary | ICD-10-CM | POA: Diagnosis not present

## 2017-12-24 DIAGNOSIS — R1312 Dysphagia, oropharyngeal phase: Secondary | ICD-10-CM | POA: Diagnosis not present

## 2017-12-24 DIAGNOSIS — S41101D Unspecified open wound of right upper arm, subsequent encounter: Secondary | ICD-10-CM | POA: Diagnosis not present

## 2017-12-24 DIAGNOSIS — I10 Essential (primary) hypertension: Secondary | ICD-10-CM | POA: Diagnosis not present

## 2017-12-25 DIAGNOSIS — E43 Unspecified severe protein-calorie malnutrition: Secondary | ICD-10-CM | POA: Diagnosis not present

## 2017-12-25 DIAGNOSIS — J449 Chronic obstructive pulmonary disease, unspecified: Secondary | ICD-10-CM | POA: Diagnosis not present

## 2017-12-25 DIAGNOSIS — S41101D Unspecified open wound of right upper arm, subsequent encounter: Secondary | ICD-10-CM | POA: Diagnosis not present

## 2017-12-25 DIAGNOSIS — R1312 Dysphagia, oropharyngeal phase: Secondary | ICD-10-CM | POA: Diagnosis not present

## 2017-12-25 DIAGNOSIS — I10 Essential (primary) hypertension: Secondary | ICD-10-CM | POA: Diagnosis not present

## 2017-12-25 DIAGNOSIS — J9611 Chronic respiratory failure with hypoxia: Secondary | ICD-10-CM | POA: Diagnosis not present

## 2017-12-26 DIAGNOSIS — E43 Unspecified severe protein-calorie malnutrition: Secondary | ICD-10-CM | POA: Diagnosis not present

## 2017-12-26 DIAGNOSIS — J449 Chronic obstructive pulmonary disease, unspecified: Secondary | ICD-10-CM | POA: Diagnosis not present

## 2017-12-26 DIAGNOSIS — I10 Essential (primary) hypertension: Secondary | ICD-10-CM | POA: Diagnosis not present

## 2017-12-26 DIAGNOSIS — R1312 Dysphagia, oropharyngeal phase: Secondary | ICD-10-CM | POA: Diagnosis not present

## 2017-12-26 DIAGNOSIS — S41101D Unspecified open wound of right upper arm, subsequent encounter: Secondary | ICD-10-CM | POA: Diagnosis not present

## 2017-12-26 DIAGNOSIS — J9611 Chronic respiratory failure with hypoxia: Secondary | ICD-10-CM | POA: Diagnosis not present

## 2017-12-30 ENCOUNTER — Encounter: Payer: Self-pay | Admitting: Internal Medicine

## 2017-12-30 ENCOUNTER — Other Ambulatory Visit: Payer: Self-pay

## 2017-12-30 ENCOUNTER — Ambulatory Visit (INDEPENDENT_AMBULATORY_CARE_PROVIDER_SITE_OTHER): Payer: Medicare Other | Admitting: Internal Medicine

## 2017-12-30 VITALS — BP 122/80 | HR 107 | Temp 98.7°F | Wt 148.3 lb

## 2017-12-30 DIAGNOSIS — E785 Hyperlipidemia, unspecified: Secondary | ICD-10-CM

## 2017-12-30 DIAGNOSIS — M7989 Other specified soft tissue disorders: Secondary | ICD-10-CM

## 2017-12-30 DIAGNOSIS — J9601 Acute respiratory failure with hypoxia: Secondary | ICD-10-CM

## 2017-12-30 DIAGNOSIS — K7290 Hepatic failure, unspecified without coma: Secondary | ICD-10-CM

## 2017-12-30 DIAGNOSIS — J449 Chronic obstructive pulmonary disease, unspecified: Secondary | ICD-10-CM

## 2017-12-30 DIAGNOSIS — Z8709 Personal history of other diseases of the respiratory system: Secondary | ICD-10-CM | POA: Diagnosis not present

## 2017-12-30 DIAGNOSIS — Z9049 Acquired absence of other specified parts of digestive tract: Secondary | ICD-10-CM

## 2017-12-30 DIAGNOSIS — I872 Venous insufficiency (chronic) (peripheral): Secondary | ICD-10-CM

## 2017-12-30 DIAGNOSIS — I10 Essential (primary) hypertension: Secondary | ICD-10-CM | POA: Diagnosis not present

## 2017-12-30 DIAGNOSIS — Z79899 Other long term (current) drug therapy: Secondary | ICD-10-CM

## 2017-12-30 DIAGNOSIS — J849 Interstitial pulmonary disease, unspecified: Secondary | ICD-10-CM

## 2017-12-30 DIAGNOSIS — Z9981 Dependence on supplemental oxygen: Secondary | ICD-10-CM | POA: Diagnosis not present

## 2017-12-30 DIAGNOSIS — Z8719 Personal history of other diseases of the digestive system: Secondary | ICD-10-CM

## 2017-12-30 DIAGNOSIS — Z5189 Encounter for other specified aftercare: Secondary | ICD-10-CM | POA: Diagnosis not present

## 2017-12-30 DIAGNOSIS — K8050 Calculus of bile duct without cholangitis or cholecystitis without obstruction: Secondary | ICD-10-CM | POA: Diagnosis not present

## 2017-12-30 MED ORDER — TIOTROPIUM BROMIDE MONOHYDRATE 18 MCG IN CAPS: 18.0000 ug | ORAL_CAPSULE | Freq: Every day | RESPIRATORY_TRACT | 2 refills | Status: DC

## 2017-12-30 MED ORDER — MIRTAZAPINE 15 MG PO TABS: 15.0000 mg | ORAL_TABLET | Freq: Every day | ORAL | 2 refills | Status: DC

## 2017-12-30 MED ORDER — ATORVASTATIN CALCIUM 20 MG PO TABS: 20.0000 mg | ORAL_TABLET | Freq: Every day | ORAL | 3 refills | Status: DC

## 2017-12-30 MED ORDER — GABAPENTIN 300 MG PO CAPS: 300.0000 mg | ORAL_CAPSULE | Freq: Two times a day (BID) | ORAL | 3 refills | Status: DC

## 2017-12-30 MED ORDER — DOXAZOSIN MESYLATE 1 MG PO TABS: 1.0000 mg | ORAL_TABLET | Freq: Every day | ORAL | 2 refills | Status: DC

## 2017-12-30 MED ORDER — ALBUTEROL SULFATE HFA 108 (90 BASE) MCG/ACT IN AERS: INHALATION_SPRAY | RESPIRATORY_TRACT | 6 refills | Status: DC

## 2017-12-30 MED ORDER — NITROGLYCERIN 0.4 MG SL SUBL: 0.4000 mg | SUBLINGUAL_TABLET | SUBLINGUAL | 2 refills | Status: AC | PRN

## 2017-12-30 MED ORDER — ASPIRIN EC 81 MG PO TBEC: 81.0000 mg | DELAYED_RELEASE_TABLET | Freq: Every day | ORAL | 2 refills | Status: AC

## 2017-12-30 NOTE — Assessment & Plan Note (Signed)
His bilirubin on discharge was 1.3.  Clinically he is improving.  We will repeat CMP and antimitochondrial antibodies. He was given a referral to see GI for his positive antimitochondrial antibodies.

## 2017-12-30 NOTE — Assessment & Plan Note (Signed)
He continued to have 1+ lower extremity edema, most likely secondary to venous insufficiency as there is no other sign of volume overload or heart failure.  -Continue Lasix 20 mg daily.

## 2017-12-30 NOTE — Patient Instructions (Signed)
Thank you for visiting clinic today. I am glad you are doing well now, continue using your oxygen as directed. I am giving you a referral to see a lung, GI and surgeon. I also putting an order to repeat your chest CT scan. We will do some lab work today-will call you with any abnormal results. Please follow-up in clinic in 2-3 weeks with PCP.

## 2017-12-30 NOTE — Assessment & Plan Note (Signed)
BP Readings from Last 3 Encounters:  12/30/17 122/80  11/19/17 116/65  10/22/17 139/80   He was normotensive today. He was just discharged on Cardura. Not taking any other antihypertensives.  Continue Cardura.

## 2017-12-30 NOTE — Assessment & Plan Note (Signed)
Patient is currently asymptomatic. Has ERCP and sphincterotomy at [facility], there was a possible residual stone in the neck of gallbladder, cholecystectomy is advisable.  He was given a referral for general surgery for evaluation and possible cholecystectomy.

## 2017-12-30 NOTE — Progress Notes (Signed)
   CC: For hospital follow-up.  HPI:  [name] is a 72 y.o. with past medical history as listed below came to the clinic for his hospital follow-up.  He was admitted at [facility] in January 2019 with nausea, vomiting and upper abdominal pain, had hyperbilirubinemia and choledocholithiasis, later transferred to [facility] because of worsening T bilirubin and concern for liver failure where he underwent ERCP with removal of gallstone and diabetes from pancreatic duct.  He was advised to follow-up with general surgery as an outpatient for cholecystectomy.  His hospital course was complicated with respiratory failure requiring intubation, later discharged on 3.5 L oxygen at rest and 7L with ambulation.  There was some concern for interstitial lung disease and ARDS and an outpatient follow-up appointment with pulmonary was advised. His AFB culture was positive with no organisms.  He was also found to have positive anti-mitochondrial antibody-at [facility] it was thought that his presentation is not consistent with PBC and he was advised to follow-up with GI as an outpatient.  He was discharged from [facility] to a skilled nursing facility on November 18, 2017, where he remained little more than 3 weeks and then discharged to home.  Currently patient is residing at home with his cousin who is helping him with his mood and ADLs.  He is feeling better, appetite is improving.  He continued to require 4 L of oxygen and become very dyspneic and started coughing with ambulation, similar episode happened in the clinic while trying to walk him to determine his oxygen requirement.  He is doing home physical therapy 3 times a week and a home health nurse comes check on him twice a week.  Patient stays mostly in bed or recliner. He denies any more nausea, vomiting or abdominal pain.  He denies any diarrhea or constipation.  He is compliant with his medication.  Please see assessment and plan for his chronic  conditions.   Past Medical History:  Diagnosis Date  . Anxiety and depression   . COPD (chronic obstructive pulmonary disease) ([place])   . GERD (gastroesophageal reflux disease)   . HTN (hypertension)   . Hyperlipemia   . Migraine   . Personal history of alcoholism ([place])   . Tobacco use   . Tubular adenoma 01/29/2008   Review of Systems: Negative except mentioned in HPI.  Physical Exam:  Vitals:   12/30/17 1351  BP: 122/80  Pulse: (!) 107  Temp: 98.7 F (37.1 C)  TempSrc: Oral  SpO2: 98%  Weight: 148 lb 4.8 oz (67.3 kg)    General: Vital signs reviewed.  Patient is well-developed , sitting in wheelchair, in no acute distress and cooperative with exam.  Head: Normocephalic and atraumatic. Eyes: EOMI, conjunctivae normal, no scleral icterus.  Cardiovascular: RRR, S1 normal, S2 normal, no murmurs, gallops, or rubs. Pulmonary/Chest: Clear to auscultation bilaterally, no wheezes, rales, or rhonchi. Abdominal: Soft, non-tender, non-distended, BS +, no masses, organomegaly, or guarding present.  Extremities: 1+ lower extremity edema bilaterally,  pulses symmetric and intact bilaterally.  Neurological: A&O x3, Strength is normal and symmetric bilaterally, cranial nerve II-XII are grossly intact, no focal motor deficit, sensory intact to light touch bilaterally.  Skin: Warm, dry and intact. No rashes or erythema. Psychiatric: Normal mood and affect. speech and behavior is normal. Cognition and memory are normal.  Assessment & Plan:   See Encounters Tab for problem based charting.  Patient discussed with Dr. Dareen Piano.

## 2017-12-30 NOTE — Assessment & Plan Note (Signed)
Patient continued to require 3-4 L of oxygen.  He become very dyspneic with mobilization and started coughing.  His oxygen saturation at rest was 98% which dropped to 92% with a very short walk. There was some concern for interstitial lung disease because of groundglass opacities on CT chest. Patient has an history of COPD.  -We will repeat high-resolution CT chest. -Pulmonary function testing. - referral for pulmonology was provided. -He was advised to continue using oxygen all the time. -

## 2017-12-31 ENCOUNTER — Telehealth: Payer: Self-pay | Admitting: Internal Medicine

## 2017-12-31 DIAGNOSIS — R1312 Dysphagia, oropharyngeal phase: Secondary | ICD-10-CM | POA: Diagnosis not present

## 2017-12-31 DIAGNOSIS — S41101D Unspecified open wound of right upper arm, subsequent encounter: Secondary | ICD-10-CM | POA: Diagnosis not present

## 2017-12-31 DIAGNOSIS — J449 Chronic obstructive pulmonary disease, unspecified: Secondary | ICD-10-CM | POA: Diagnosis not present

## 2017-12-31 DIAGNOSIS — E43 Unspecified severe protein-calorie malnutrition: Secondary | ICD-10-CM | POA: Diagnosis not present

## 2017-12-31 DIAGNOSIS — I10 Essential (primary) hypertension: Secondary | ICD-10-CM | POA: Diagnosis not present

## 2017-12-31 DIAGNOSIS — J9611 Chronic respiratory failure with hypoxia: Secondary | ICD-10-CM | POA: Diagnosis not present

## 2017-12-31 LAB — CBC
HEMOGLOBIN: 12.1 g/dL — AB (ref 13.0–17.7)
Hematocrit: 37 % — ABNORMAL LOW (ref 37.5–51.0)
MCH: 29.5 pg (ref 26.6–33.0)
MCHC: 32.7 g/dL (ref 31.5–35.7)
MCV: 90 fL (ref 79–97)
Platelets: 250 10*3/uL (ref 150–379)
RBC: 4.1 x10E6/uL — AB (ref 4.14–5.80)
RDW: 14.1 % (ref 12.3–15.4)
WBC: 9 10*3/uL (ref 3.4–10.8)

## 2017-12-31 LAB — CMP14 + ANION GAP
ALK PHOS: 98 IU/L (ref 39–117)
ALT: 11 IU/L (ref 0–44)
ANION GAP: 15 mmol/L (ref 10.0–18.0)
AST: 20 IU/L (ref 0–40)
Albumin/Globulin Ratio: 1 — ABNORMAL LOW (ref 1.2–2.2)
Albumin: 3.7 g/dL (ref 3.5–4.8)
BUN/Creatinine Ratio: 5 — ABNORMAL LOW (ref 10–24)
BUN: 4 mg/dL — AB (ref 8–27)
Bilirubin Total: 0.4 mg/dL (ref 0.0–1.2)
CALCIUM: 9.6 mg/dL (ref 8.6–10.2)
CO2: 25 mmol/L (ref 20–29)
CREATININE: 0.8 mg/dL (ref 0.76–1.27)
Chloride: 103 mmol/L (ref 96–106)
GFR calc Af Amer: 104 mL/min/{1.73_m2} (ref >59)
GFR, EST NON AFRICAN AMERICAN: 90 mL/min/{1.73_m2} (ref >59)
GLUCOSE: 86 mg/dL (ref 65–99)
Globulin, Total: 3.7 g/dL (ref 1.5–4.5)
Potassium: 4.8 mmol/L (ref 3.5–5.2)
Sodium: 143 mmol/L (ref 134–144)
Total Protein: 7.4 g/dL (ref 6.0–8.5)

## 2017-12-31 LAB — MITOCHONDRIAL ANTIBODIES: MITOCHONDRIAL AB: 21.3 U — AB (ref 0.0–20.0)

## 2017-12-31 NOTE — Telephone Encounter (Signed)
Please advise on scheduling

## 2017-12-31 NOTE — Telephone Encounter (Signed)
Routine - not urgent - his LFT's are all back to normal

## 2017-12-31 NOTE — Telephone Encounter (Signed)
Please see Dr. Celesta Aver response to scheduling. Thank you.

## 2017-12-31 NOTE — Telephone Encounter (Signed)
Phone rings "fast busy" will try again.

## 2017-12-31 NOTE — Progress Notes (Signed)
Internal Medicine Clinic Attending  Case discussed with Dr. Amin at the time of the visit.  We reviewed the resident's history and exam and pertinent patient test results.  I agree with the assessment, diagnosis, and plan of care documented in the resident's note.    

## 2018-01-01 ENCOUNTER — Encounter: Payer: Self-pay | Admitting: Internal Medicine

## 2018-01-02 DIAGNOSIS — S41101D Unspecified open wound of right upper arm, subsequent encounter: Secondary | ICD-10-CM | POA: Diagnosis not present

## 2018-01-02 DIAGNOSIS — J449 Chronic obstructive pulmonary disease, unspecified: Secondary | ICD-10-CM | POA: Diagnosis not present

## 2018-01-02 DIAGNOSIS — R1312 Dysphagia, oropharyngeal phase: Secondary | ICD-10-CM | POA: Diagnosis not present

## 2018-01-02 DIAGNOSIS — E43 Unspecified severe protein-calorie malnutrition: Secondary | ICD-10-CM | POA: Diagnosis not present

## 2018-01-02 DIAGNOSIS — I10 Essential (primary) hypertension: Secondary | ICD-10-CM | POA: Diagnosis not present

## 2018-01-02 DIAGNOSIS — J9611 Chronic respiratory failure with hypoxia: Secondary | ICD-10-CM | POA: Diagnosis not present

## 2018-01-07 ENCOUNTER — Emergency Department (HOSPITAL_COMMUNITY): Payer: Medicare Other

## 2018-01-07 ENCOUNTER — Encounter (HOSPITAL_COMMUNITY): Payer: Self-pay | Admitting: Emergency Medicine

## 2018-01-07 ENCOUNTER — Observation Stay (HOSPITAL_COMMUNITY)
Admission: EM | Admit: 2018-01-07 | Discharge: 2018-01-08 | Disposition: A | Discharge status: Home / Self Care | Payer: Medicare Other | Attending: Student in an Organized Health Care Education/Training Program | Admitting: Student in an Organized Health Care Education/Training Program

## 2018-01-07 ENCOUNTER — Ambulatory Visit (HOSPITAL_COMMUNITY)
Admission: RE | Admit: 2018-01-07 | Discharge: 2018-01-07 | Disposition: A | Discharge status: Home / Self Care | Payer: Medicare Other | Source: Ambulatory Visit | Attending: Internal Medicine | Admitting: Internal Medicine

## 2018-01-07 ENCOUNTER — Other Ambulatory Visit: Payer: Self-pay

## 2018-01-07 DIAGNOSIS — R072 Precordial pain: Secondary | ICD-10-CM | POA: Diagnosis not present

## 2018-01-07 DIAGNOSIS — Z79899 Other long term (current) drug therapy: Secondary | ICD-10-CM | POA: Diagnosis not present

## 2018-01-07 DIAGNOSIS — J849 Interstitial pulmonary disease, unspecified: Secondary | ICD-10-CM

## 2018-01-07 DIAGNOSIS — E785 Hyperlipidemia, unspecified: Secondary | ICD-10-CM | POA: Diagnosis not present

## 2018-01-07 DIAGNOSIS — R0789 Other chest pain: Secondary | ICD-10-CM | POA: Diagnosis not present

## 2018-01-07 DIAGNOSIS — Z8249 Family history of ischemic heart disease and other diseases of the circulatory system: Secondary | ICD-10-CM | POA: Insufficient documentation

## 2018-01-07 DIAGNOSIS — Z7951 Long term (current) use of inhaled steroids: Secondary | ICD-10-CM | POA: Diagnosis not present

## 2018-01-07 DIAGNOSIS — R0602 Shortness of breath: Secondary | ICD-10-CM | POA: Diagnosis not present

## 2018-01-07 DIAGNOSIS — K3184 Gastroparesis: Secondary | ICD-10-CM | POA: Insufficient documentation

## 2018-01-07 DIAGNOSIS — Z86010 Personal history of colonic polyps: Secondary | ICD-10-CM | POA: Diagnosis not present

## 2018-01-07 DIAGNOSIS — J189 Pneumonia, unspecified organism: Secondary | ICD-10-CM | POA: Diagnosis not present

## 2018-01-07 DIAGNOSIS — J449 Chronic obstructive pulmonary disease, unspecified: Secondary | ICD-10-CM

## 2018-01-07 DIAGNOSIS — Z6822 Body mass index (BMI) 22.0-22.9, adult: Secondary | ICD-10-CM | POA: Insufficient documentation

## 2018-01-07 DIAGNOSIS — Z823 Family history of stroke: Secondary | ICD-10-CM | POA: Diagnosis not present

## 2018-01-07 DIAGNOSIS — E440 Moderate protein-calorie malnutrition: Secondary | ICD-10-CM | POA: Diagnosis not present

## 2018-01-07 DIAGNOSIS — Z7982 Long term (current) use of aspirin: Secondary | ICD-10-CM | POA: Insufficient documentation

## 2018-01-07 DIAGNOSIS — J9621 Acute and chronic respiratory failure with hypoxia: Secondary | ICD-10-CM | POA: Diagnosis not present

## 2018-01-07 DIAGNOSIS — J9611 Chronic respiratory failure with hypoxia: Secondary | ICD-10-CM | POA: Diagnosis present

## 2018-01-07 DIAGNOSIS — I10 Essential (primary) hypertension: Secondary | ICD-10-CM | POA: Diagnosis not present

## 2018-01-07 DIAGNOSIS — Z981 Arthrodesis status: Secondary | ICD-10-CM | POA: Insufficient documentation

## 2018-01-07 DIAGNOSIS — M545 Low back pain: Secondary | ICD-10-CM | POA: Insufficient documentation

## 2018-01-07 DIAGNOSIS — M810 Age-related osteoporosis without current pathological fracture: Secondary | ICD-10-CM | POA: Insufficient documentation

## 2018-01-07 DIAGNOSIS — Z9981 Dependence on supplemental oxygen: Secondary | ICD-10-CM | POA: Diagnosis not present

## 2018-01-07 DIAGNOSIS — R7989 Other specified abnormal findings of blood chemistry: Secondary | ICD-10-CM | POA: Insufficient documentation

## 2018-01-07 DIAGNOSIS — R079 Chest pain, unspecified: Secondary | ICD-10-CM | POA: Diagnosis not present

## 2018-01-07 DIAGNOSIS — D696 Thrombocytopenia, unspecified: Secondary | ICD-10-CM | POA: Diagnosis not present

## 2018-01-07 DIAGNOSIS — Z87891 Personal history of nicotine dependence: Secondary | ICD-10-CM | POA: Insufficient documentation

## 2018-01-07 LAB — PULMONARY FUNCTION TEST
DL/VA % pred: 46 %
DL/VA: 2.05 ml/min/mmHg/L
DLCO UNC: 5.98 ml/min/mmHg
DLCO unc % pred: 21 %
FEF 25-75 Post: 1.29 L/sec
FEF 25-75 Pre: 0.92 L/sec
FEF2575-%Change-Post: 40 %
FEF2575-%PRED-PRE: 43 %
FEF2575-%Pred-Post: 61 %
FEV1-%Change-Post: 11 %
FEV1-%Pred-Post: 62 %
FEV1-%Pred-Pre: 56 %
FEV1-Post: 1.76 L
FEV1-Pre: 1.58 L
FEV1FVC-%CHANGE-POST: 12 %
FEV1FVC-%Pred-Pre: 90 %
FEV6-%CHANGE-POST: -5 %
FEV6-%PRED-PRE: 66 %
FEV6-%Pred-Post: 62 %
FEV6-POST: 2.27 L
FEV6-PRE: 2.39 L
FEV6FVC-%PRED-POST: 106 %
FEV6FVC-%PRED-PRE: 106 %
FVC-%Change-Post: 0 %
FVC-%PRED-POST: 61 %
FVC-%PRED-PRE: 61 %
FVC-POST: 2.38 L
FVC-Pre: 2.39 L
POST FEV6/FVC RATIO: 100 %
PRE FEV1/FVC RATIO: 66 %
Post FEV1/FVC ratio: 74 %
Pre FEV6/FVC Ratio: 100 %

## 2018-01-07 LAB — I-STAT CHEM 8, ED
BUN: 3 mg/dL — ABNORMAL LOW (ref 6–20)
CHLORIDE: 101 mmol/L (ref 101–111)
Calcium, Ion: 1.04 mmol/L — ABNORMAL LOW (ref 1.15–1.40)
Creatinine, Ser: 0.6 mg/dL — ABNORMAL LOW (ref 0.61–1.24)
GLUCOSE: 107 mg/dL — AB (ref 65–99)
HEMATOCRIT: 39 % (ref 39.0–52.0)
Hemoglobin: 13.3 g/dL (ref 13.0–17.0)
POTASSIUM: 3.1 mmol/L — AB (ref 3.5–5.1)
Sodium: 139 mmol/L (ref 135–145)
TCO2: 27 mmol/L (ref 22–32)

## 2018-01-07 LAB — I-STAT TROPONIN, ED: Troponin i, poc: 0 ng/mL (ref 0.00–0.08)

## 2018-01-07 LAB — CBC WITH DIFFERENTIAL/PLATELET
BASOS PCT: 0 %
Basophils Absolute: 0 10*3/uL (ref 0.0–0.1)
Eosinophils Absolute: 0.2 10*3/uL (ref 0.0–0.7)
Eosinophils Relative: 2 %
HEMATOCRIT: 38.4 % — AB (ref 39.0–52.0)
Hemoglobin: 11.7 g/dL — ABNORMAL LOW (ref 13.0–17.0)
LYMPHS ABS: 1.7 10*3/uL (ref 0.7–4.0)
Lymphocytes Relative: 15 %
MCH: 28.3 pg (ref 26.0–34.0)
MCHC: 30.5 g/dL (ref 30.0–36.0)
MCV: 92.8 fL (ref 78.0–100.0)
MONOS PCT: 6 %
Monocytes Absolute: 0.7 10*3/uL (ref 0.1–1.0)
NEUTROS PCT: 77 %
Neutro Abs: 8.4 10*3/uL — ABNORMAL HIGH (ref 1.7–7.7)
Platelets: 243 10*3/uL (ref 150–400)
RBC: 4.14 MIL/uL — AB (ref 4.22–5.81)
RDW: 13.8 % (ref 11.5–15.5)
WBC: 10.9 10*3/uL — ABNORMAL HIGH (ref 4.0–10.5)

## 2018-01-07 LAB — BASIC METABOLIC PANEL
ANION GAP: 12 (ref 5–15)
BUN: <5 mg/dL — ABNORMAL LOW (ref 6–20)
CALCIUM: 8.4 mg/dL — AB (ref 8.9–10.3)
CHLORIDE: 102 mmol/L (ref 101–111)
CO2: 24 mmol/L (ref 22–32)
Creatinine, Ser: 0.71 mg/dL (ref 0.61–1.24)
GFR calc non Af Amer: >60 mL/min (ref >60)
Glucose, Bld: 105 mg/dL — ABNORMAL HIGH (ref 65–99)
Potassium: 3.1 mmol/L — ABNORMAL LOW (ref 3.5–5.1)
Sodium: 138 mmol/L (ref 135–145)

## 2018-01-07 LAB — HEPATIC FUNCTION PANEL
ALBUMIN: 3.1 g/dL — AB (ref 3.5–5.0)
ALT: 10 U/L — ABNORMAL LOW (ref 17–63)
AST: 18 U/L (ref 15–41)
Alkaline Phosphatase: 90 U/L (ref 38–126)
Bilirubin, Direct: 0.1 mg/dL (ref 0.1–0.5)
Indirect Bilirubin: 0.5 mg/dL (ref 0.3–0.9)
Total Bilirubin: 0.6 mg/dL (ref 0.3–1.2)
Total Protein: 7.2 g/dL (ref 6.5–8.1)

## 2018-01-07 LAB — I-STAT CG4 LACTIC ACID, ED: LACTIC ACID, VENOUS: 0.8 mmol/L (ref 0.5–1.9)

## 2018-01-07 LAB — BRAIN NATRIURETIC PEPTIDE: B NATRIURETIC PEPTIDE 5: 78.5 pg/mL (ref 0.0–100.0)

## 2018-01-07 MED ORDER — MORPHINE SULFATE (PF) 4 MG/ML IV SOLN
2.0000 mg | Freq: Once | INTRAVENOUS | Status: AC
  Administered 2018-01-07: 2 mg via INTRAVENOUS
  Filled 2018-01-07: qty 1

## 2018-01-07 MED ORDER — VANCOMYCIN HCL IN DEXTROSE 750-5 MG/150ML-% IV SOLN
750.0000 mg | Freq: Two times a day (BID) | INTRAVENOUS | Status: DC
Start: 2018-01-08 — End: 2018-01-08
  Administered 2018-01-08: 750 mg via INTRAVENOUS
  Filled 2018-01-07 (×2): qty 150

## 2018-01-07 MED ORDER — ACETAMINOPHEN 650 MG RE SUPP: 650.0000 mg | Freq: Four times a day (QID) | RECTAL | Status: DC | PRN

## 2018-01-07 MED ORDER — ASPIRIN EC 81 MG PO TBEC
81.0000 mg | DELAYED_RELEASE_TABLET | Freq: Every day | ORAL | Status: DC
  Administered 2018-01-08: 81 mg via ORAL
  Filled 2018-01-07: qty 1

## 2018-01-07 MED ORDER — GABAPENTIN 300 MG PO CAPS
300.0000 mg | ORAL_CAPSULE | Freq: Two times a day (BID) | ORAL | Status: DC
  Administered 2018-01-08 (×2): 300 mg via ORAL
  Filled 2018-01-07 (×2): qty 1

## 2018-01-07 MED ORDER — IOPAMIDOL (ISOVUE-370) INJECTION 76%
100.0000 mL | Freq: Once | INTRAVENOUS | Status: AC | PRN
  Administered 2018-01-07: 100 mL via INTRAVENOUS

## 2018-01-07 MED ORDER — SODIUM CHLORIDE 0.9 % IV SOLN
1.0000 g | Freq: Three times a day (TID) | INTRAVENOUS | Status: DC
  Administered 2018-01-08: 1 g via INTRAVENOUS
  Filled 2018-01-07 (×2): qty 1

## 2018-01-07 MED ORDER — ALBUTEROL SULFATE (2.5 MG/3ML) 0.083% IN NEBU
2.5000 mg | INHALATION_SOLUTION | Freq: Once | RESPIRATORY_TRACT | Status: AC
  Administered 2018-01-07: 2.5 mg via RESPIRATORY_TRACT

## 2018-01-07 MED ORDER — TIOTROPIUM BROMIDE MONOHYDRATE 18 MCG IN CAPS
18.0000 ug | ORAL_CAPSULE | Freq: Every day | RESPIRATORY_TRACT | Status: DC
  Administered 2018-01-08: 18 ug via RESPIRATORY_TRACT
  Filled 2018-01-07: qty 5

## 2018-01-07 MED ORDER — IOPAMIDOL (ISOVUE-370) INJECTION 76%
INTRAVENOUS | Status: AC
  Filled 2018-01-07: qty 100

## 2018-01-07 MED ORDER — METHYLPREDNISOLONE SODIUM SUCC 125 MG IJ SOLR
125.0000 mg | Freq: Once | INTRAMUSCULAR | Status: AC
  Administered 2018-01-07: 125 mg via INTRAVENOUS
  Filled 2018-01-07: qty 2

## 2018-01-07 MED ORDER — VANCOMYCIN HCL IN DEXTROSE 1-5 GM/200ML-% IV SOLN
1000.0000 mg | Freq: Once | INTRAVENOUS | Status: AC
  Administered 2018-01-07: 1000 mg via INTRAVENOUS
  Filled 2018-01-07: qty 200

## 2018-01-07 MED ORDER — SODIUM CHLORIDE 0.9% FLUSH
3.0000 mL | Freq: Two times a day (BID) | INTRAVENOUS | Status: DC
  Administered 2018-01-08: 3 mL via INTRAVENOUS

## 2018-01-07 MED ORDER — ATORVASTATIN CALCIUM 20 MG PO TABS
20.0000 mg | ORAL_TABLET | Freq: Every day | ORAL | Status: DC
  Administered 2018-01-08: 20 mg via ORAL
  Filled 2018-01-07: qty 1

## 2018-01-07 MED ORDER — SODIUM CHLORIDE 0.9 % IV BOLUS
500.0000 mL | Freq: Once | INTRAVENOUS | Status: AC
  Administered 2018-01-08: 500 mL via INTRAVENOUS

## 2018-01-07 MED ORDER — ENOXAPARIN SODIUM 40 MG/0.4ML ~~LOC~~ SOLN
40.0000 mg | Freq: Every day | SUBCUTANEOUS | Status: DC
  Administered 2018-01-08: 40 mg via SUBCUTANEOUS
  Filled 2018-01-07: qty 0.4

## 2018-01-07 MED ORDER — MIRTAZAPINE 15 MG PO TABS
15.0000 mg | ORAL_TABLET | Freq: Every day | ORAL | Status: DC
  Administered 2018-01-08: 15 mg via ORAL
  Filled 2018-01-07: qty 1

## 2018-01-07 MED ORDER — POLYETHYLENE GLYCOL 3350 17 G PO PACK: 17.0000 g | PACK | Freq: Every day | ORAL | Status: DC | PRN

## 2018-01-07 MED ORDER — SODIUM CHLORIDE 0.9 % IV SOLN
2.0000 g | Freq: Once | INTRAVENOUS | Status: AC
  Administered 2018-01-07: 2 g via INTRAVENOUS
  Filled 2018-01-07: qty 2

## 2018-01-07 MED ORDER — ACETAMINOPHEN 325 MG PO TABS: 650.0000 mg | ORAL_TABLET | Freq: Four times a day (QID) | ORAL | Status: DC | PRN

## 2018-01-07 MED ORDER — SODIUM CHLORIDE 0.9 % IV BOLUS
500.0000 mL | Freq: Once | INTRAVENOUS | Status: AC
  Administered 2018-01-07: 500 mL via INTRAVENOUS

## 2018-01-07 MED ORDER — POTASSIUM CHLORIDE CRYS ER 20 MEQ PO TBCR
30.0000 meq | EXTENDED_RELEASE_TABLET | Freq: Two times a day (BID) | ORAL | Status: AC
  Administered 2018-01-08 (×2): 30 meq via ORAL
  Filled 2018-01-07 (×2): qty 1

## 2018-01-07 MED ORDER — NITROGLYCERIN 0.4 MG SL SUBL
0.4000 mg | SUBLINGUAL_TABLET | SUBLINGUAL | Status: DC | PRN
  Administered 2018-01-07: 0.4 mg via SUBLINGUAL
  Filled 2018-01-07: qty 1

## 2018-01-07 NOTE — ED Notes (Signed)
Label sent to main lab to run hfp blood test.

## 2018-01-07 NOTE — H&P (Addendum)
Date: 01/08/2018               Patient Name:  [name] MRN: [id number]  DOB: 1946-02-18 Age / Sex: 72 y.o., male   PCP: Lorella Nimrod, MD         Medical Service: Internal Medicine Teaching Service         Attending Physician: Dr. Evette Doffing, Mallie Mussel, *    First Contact: Dr. Pearson Grippe Pager: [phone]  Second Contact: Dr. Ledell Noss Pager: [phone]       After Hours (After 5p/  First Contact Pager: [phone]  weekends / holidays): Second Contact Pager: [phone]   Chief Complaint: Chest pain and shortness of breath  History of Present Illness:  [name] is a 72 yo with PMH of HTN, COPD, HLD, osteoporosis, and tobacco use who presents for evaluation of acute onset L sided chest pain. History provided directly from the patient. Patient states he was in usual state of health until early this afternoon when he experienced sudden onset sharp L sided chest pain while laying down to take a nap. He never experienced pain like this before. This pain scared him given recent hospitalization so he promptly called EMS. The pain is described as sharp, constant, and rated as a 9/10. It is located in mid L chest (at nipple level in mid clavicular line) and radiates to L flank. The pain worsens with movement and inspiration. It was relieved with pain medication given by EMS. Patient denies productive cough, fevers, chills, diaphoresis, nausea, vomiting. Pain associated with some SOB at rest, but it may be because patient states it hurts more with inspiration. No abdominal pain, leg swelling, diarrhea, or dysuria. The pain began shortly after he did pulmonary function testing to work up his chronic interstitial pulmonary disease. It lasted for several hours, essentially through the night, and has now resolved on its own.  Per chart review patient recently admitted to [facility] from 1/8-1/15 for management of fever, diffuse abdominal pain, and elevated LFT's. He was diagnosed with cholangitis  and medically managed with Zosyn. He continue to clinically worsen in spite of this therapy and later developed hepatic failure, with bilirubin elevated to 21.8. He later develoed acute respiratory failure with hypoxia that was thought to be 2/2 ARDS as a result of sepsis/hyperbilirubinemia. He was  transfered to [facility] on 10/22/17 for further management. He remained at [facility] from 10/22/2017 - 11/18/2017. While there he underwent elective intubation prior to ERCP and sphincterotomy. Patient was intubate for about 24 hours and respiratory status improved with diuresis (O2 3.5L at rest and 7L with exertion).    Meds:  Current Meds  Medication Sig  . acetaminophen (TYLENOL) 325 MG tablet Take 2 tablets (650 mg total) by mouth every 6 (six) hours as needed for mild pain or moderate pain.  Marland Kitchen albuterol (PROAIR HFA) 108 (90 Base) MCG/ACT inhaler INHALE 2 PUFFS INTO THE LUNGS FOUR TIMES DAILY  . alendronate (FOSAMAX) 70 MG tablet Take 1 tablet (70 mg total) by mouth every 7 (seven) days. Take with a full glass of water on an empty stomach. (Patient taking differently: Take 70 mg by mouth every Friday. Take with a full glass of water on an empty stomach.)  . aspirin EC 81 MG tablet Take 1 tablet (81 mg total) by mouth daily.  Marland Kitchen atorvastatin (LIPITOR) 20 MG tablet Take 1 tablet (20 mg total) by mouth daily. (Patient taking differently: Take 20 mg by mouth at bedtime. )  .  doxazosin (CARDURA) 1 MG tablet Take 1 tablet (1 mg total) by mouth at bedtime.  . gabapentin (NEURONTIN) 300 MG capsule Take 1 capsule (300 mg total) by mouth 2 (two) times daily.  . mirtazapine (REMERON) 15 MG tablet Take 1 tablet (15 mg total) by mouth at bedtime.  . Multiple Vitamins-Minerals (CENTRUM SILVER ADULT 50+) TABS Take 1 tablet by mouth daily.  . nitroGLYCERIN (NITROSTAT) 0.4 MG SL tablet Place 1 tablet (0.4 mg total) under the tongue every 5 (five) minutes as needed for chest pain. x3 doses  . OXYGEN Inhale 3-7 L/min into the lungs  continuous.   . polyethylene glycol (MIRALAX / GLYCOLAX) packet Take 17 g by mouth daily as needed for mild constipation.   Marland Kitchen tiotropium (SPIRIVA) 18 MCG inhalation capsule Place 1 capsule (18 mcg total) into inhaler and inhale daily. (Patient taking differently: Place 18 mcg into inhaler and inhale at bedtime. )  . Zinc Oxide (DESITIN) 40 % PSTE Apply 1 application topically See admin instructions. Apply 1 application to sacral area daily after bath   Allergies: Allergies as of 01/07/2018  . (No Known Allergies)   Past Medical History: Past Medical History:  Diagnosis Date  . Anxiety and depression   . COPD (chronic obstructive pulmonary disease) ([place])   . GERD (gastroesophageal reflux disease)   . HTN (hypertension)   . Hyperlipemia   . Migraine   . Personal history of alcoholism ([place])   . Tobacco use   . Tubular adenoma 01/29/2008   Past Surgical History: Past Surgical History:  Procedure Laterality Date  . ANTERIOR FUSION CERVICAL SPINE  2005   two surgeries after two mechanical falls, per Dr. Donald Pore  . COLONOSCOPY W/ BIOPSIES  01/29/2008   Dr. Silvano Rusk  . ESOPHAGOGASTRODUODENOSCOPY N/A 10/15/2017   Procedure: ESOPHAGOGASTRODUODENOSCOPY (EGD);  Surgeon: Irene Shipper, MD;  Location: [facility] ENDOSCOPY;  Service: Endoscopy;  Laterality: N/A;  . [place]   correction for crossed-eyes  . HEMORRHOID SURGERY  1971   Family History:  Family History  Problem Relation Age of Onset  . Heart attack Mother   . Heart attack Father   . Stroke Sister   . Heart attack Brother   . Heart attack Sister    Social History:  Patient lives with cousin. Has nurse and physical therapist who comes to his house with home health 3x's a week. Patient has been ambulating with walker since leaving hospital, sometimes does not use it while walking around house. Patient reports two mechanical falls when walking without walker most recently. Patient quit smoking about 3 months ago. Prior to that,  states he smoked for 50 years. At most would smoke 1-2 packs per day, decreased to 1 pack per week prior to quitting. No alcohol or illicit drug use.  Review of Systems: A complete ROS was negative except as per HPI.   Physical Exam: Blood pressure 93/71, pulse (!) 108, temperature 98.3 F (36.8 C), temperature source Oral, resp. rate (!) 22, SpO2 98 %.   Physical Exam  Constitutional:  Elderly gentleman sitting comfortably in bed in no acute distress  HENT:  Oropharynx dry without exudate  Eyes: Pupils are equal, round, and reactive to light. Conjunctivae and EOM are normal.  Cardiovascular: Normal rate, regular rhythm and intact distal pulses. Exam reveals no friction rub.  No murmur heard. Respiratory: He exhibits tenderness (on L chest wall and L flank).  [place] in place, 4L O2. Patient speaking comfortably in full sentences. No  accessory muscle use or nasal flaring. Bibasilar crackles with prolonged expiratory phase on exam. No crackles in apical lung fields. Patient appeared more tachypneic with mild accessory muscle use when O2 weaned to 3L by examiner.  GI: Soft. Bowel sounds are normal. He exhibits no distension. There is no tenderness. There is no rebound.  Musculoskeletal: He exhibits no edema (of bilateral lower extremities) or tenderness (of bilateral lower extremities).  Lymphadenopathy:    He has no cervical adenopathy.  Neurological:  Face strength and sensation intact bilaterally. Tongue midline. Gross motor and sensation to light touch of upper and lower extremities intact bilaterally.  Skin: Skin is warm and dry. No rash noted. No erythema.  Increased skin turgor.   EKG: personally reviewed my interpretation is sinus tachycardia without new ST elevations or TWI to suggest ischemia. Unchanged ST depression in leads V4 and V5 compared to prior EKG on 10/19/2017.  CXR: personally reviewed my interpretation is diffuse interstitial opacities throughout bilateral lung fields  without evidence of lobar consolidation or pleural effusions. Low lung volumes. Lung parenchyma appears similar to chest X ray from 10/21/2017.  Assessment & Plan by Problem: Principal Problem:   Atypical chest pain Active Problems:   COPD (chronic obstructive pulmonary disease) (HCC)   Chronic hypoxemic respiratory failure (HCC)  [name] is a 72 yo with PMH of HTN, COPD, HLD, osteoporosis, and tobacco use who presented with chronic hypoxic respiratory failure and atypical chest pain.   Chronic hypoxic respiratory failure with probable ILD: Clinical course has been stable over the last several weeks.  Imaging shows persistent diffuse reticular opacities which seem most consistent with interstitial lung disease given the time course.  Clinically he does not appear to have pneumonia: no fever, minimal leukocytosis, negative procalcitonin, negative influenza PCR.  Plan will be for him to follow-up with pulmonology, to which he was already referred from the outpatient clinic.  Continue with home Spiriva and as needed albuterol  Atypical chest pain: ACS ruled out with normal troponin and reassuring EKG.  He had a low risk Myoview stress test in 2017.  PE also ruled out with CT angiogram.  Clinical course seems most consistent with a musculoskeletal strain probably related to spirometry maneuvers that he had done just hours before symptom onset.  Symptoms have already resolved and I do not think they need further workup. -Tylenol PRN for pain control  HLD: -Home atorvastatin 20 mg daily  FEN/GI: -Heart Healthy diet -NS bolus 500 mL, replace K+ 60 mEq  VTE Prophylaxis: Lovenox daily Code Status: Full  Dispo: Admit patient to Observation with expected length of stay less than 2 midnights.  SignedThomasene Ripple, MD 01/08/2018, 12:40 AM  Pager: [phone]    Internal Medicine Attending:   I saw and examined the patient. I reviewed the resident's note and I agree with the  resident's findings and plan as documented in the resident's note.  Lalla Brothers, MD

## 2018-01-07 NOTE — ED Triage Notes (Signed)
Pt here from home with c/o chest pain and sob after a pulmonary function test

## 2018-01-07 NOTE — Progress Notes (Signed)
Pharmacy Antibiotic Note  [name] is a 72 y.o. male admitted on 01/07/2018 with pneumonia.  Pharmacy has been consulted for vancomycin dosing. Patient has received one dose of cefepime in the ED. WBC 10.9, afebrile  Plan: Vancomycin 1000mg  IV once then 750mg  IV every 12 hours.  Goal trough 15-20 mcg/mL. Cefepime 2G IV x1 in the ED Monitor clinical progression and LOT     Temp (24hrs), Avg:98.1 F (36.7 C), Min:98.1 F (36.7 C), Max:98.1 F (36.7 C)  Recent Labs  Lab 01/07/18 1645 01/07/18 1724  WBC 10.9*  --   CREATININE 0.71 0.60*    Estimated Creatinine Clearance: 79.2 mL/min (A) (by C-G formula based on SCr of 0.6 mg/dL (L)).    No Known Allergies  Thank you for allowing pharmacy to be a part of this patient's care.  Jodean Lima Ileen Kahre 01/07/2018 7:05 PM

## 2018-01-07 NOTE — ED Provider Notes (Signed)
[facility] Provider Note   CSN: [id number] Arrival date & time: 01/07/18  1621     History   Chief Complaint Chief Complaint  Patient presents with  . Chest Pain  . Shortness of Breath    HPI [name] is a 73 y.o. male.  The history is provided by the patient. No language interpreter was used.  Chest Pain   Associated symptoms include shortness of breath.  Shortness of Breath  Associated symptoms include chest pain.   [name] is a 72 y.o. male who presents to the Emergency Department complaining of chest pain. He reports severe left-sided chest pain that began about three hours prior to arrival. Symptoms began at rest. It is a constant pain with associated shortness of breath. It radiates to his left lower chest wall. He denies any fevers, cough, nausea, vomiting, abdominal pain. He does endorse chronic lower extremity edema that is unchanged from his baseline. He does have a history of lung disease and is on chronic supplemental oxygen, 3 1/2 L. No prior similar pain in the past. He did take nitroglycerin prior to ED arrival with mild improvement in his symptoms. Pain is currently 10 out of 10. Past Medical History:  Diagnosis Date  . Anxiety and depression   . COPD (chronic obstructive pulmonary disease) ([place])   . GERD (gastroesophageal reflux disease)   . HTN (hypertension)   . Hyperlipemia   . Migraine   . Personal history of alcoholism ([place])   . Tobacco use   . Tubular adenoma 01/29/2008    Patient Active Problem List   Diagnosis Date Noted  . HCAP (healthcare-associated pneumonia) 01/07/2018  . Choledocholithiasis 11/19/2017  . Right carotid bruit 11/19/2017  . Malnutrition of moderate degree 10/22/2017  . Ground glass opacity present on imaging of lung   . Acute respiratory failure with hypoxia ([place])   . Hypoxemia   . Enteropathogenic Escherichia coli infection 10/20/2017  . Pulmonary infiltrates 10/17/2017  . Acute  respiratory distress 10/17/2017  . Acute respiratory alkalosis 10/17/2017  . Mediastinal lymphadenopathy 10/17/2017  . Pleural effusion, bilateral 10/17/2017  . Thrombocytopenia ([place]) 10/16/2017  . Hepatic failure ([place]) 10/15/2017  . Cholestasis 10/15/2017  . Gastroparesis   . Elevated liver function tests   . Acute low back pain 06/27/2017  . Osteoporosis 06/27/2017  . Leg cramps 08/13/2016  . Stable angina ([place]) 08/13/2016  . OSA (obstructive sleep apnea) 08/23/2014  . Hx of migraines 05/26/2014  . Back pain, chronic 05/26/2014  . History of colonic polyps 12/29/2012  . Hyperlipemia 11/12/2012  . COPD (chronic obstructive pulmonary disease) ([place]) 11/12/2012  . Leg swelling 11/12/2012  . HTN (hypertension) 11/12/2012  . Tobacco use 11/12/2012    Past Surgical History:  Procedure Laterality Date  . ANTERIOR FUSION CERVICAL SPINE  2005   two surgeries after two mechanical falls, per Dr. Donald Pore  . COLONOSCOPY W/ BIOPSIES  01/29/2008   Dr. Silvano Rusk  . ESOPHAGOGASTRODUODENOSCOPY N/A 10/15/2017   Procedure: ESOPHAGOGASTRODUODENOSCOPY (EGD);  Surgeon: Irene Shipper, MD;  Location: [facility] ENDOSCOPY;  Service: Endoscopy;  Laterality: N/A;  . [place]   correction for crossed-eyes  . [place] Medications    Prior to Admission medications   Medication Sig Start Date End Date Taking? Authorizing Provider  acetaminophen (TYLENOL) 325 MG tablet Take 2 tablets (650 mg total) by mouth every 6 (six) hours as needed for  mild pain or moderate pain. 06/25/17  Yes Waynetta Pean, PA-C  albuterol (PROAIR HFA) 108 (90 Base) MCG/ACT inhaler INHALE 2 PUFFS INTO THE LUNGS FOUR TIMES DAILY 12/30/17  Yes Lorella Nimrod, MD  alendronate (FOSAMAX) 70 MG tablet Take 1 tablet (70 mg total) by mouth every 7 (seven) days. Take with a full glass of water on an empty stomach. Patient taking differently: Take 70 mg by mouth every Friday. Take with a full glass of water on  an empty stomach. 06/27/17 06/27/18 Yes Alphonzo Grieve, MD  aspirin EC 81 MG tablet Take 1 tablet (81 mg total) by mouth daily. 12/30/17 12/30/18 Yes Lorella Nimrod, MD  atorvastatin (LIPITOR) 20 MG tablet Take 1 tablet (20 mg total) by mouth daily. Patient taking differently: Take 20 mg by mouth at bedtime.  12/30/17  Yes Lorella Nimrod, MD  doxazosin (CARDURA) 1 MG tablet Take 1 tablet (1 mg total) by mouth at bedtime. 12/30/17  Yes Lorella Nimrod, MD  gabapentin (NEURONTIN) 300 MG capsule Take 1 capsule (300 mg total) by mouth 2 (two) times daily. 12/30/17 12/30/18 Yes Lorella Nimrod, MD  mirtazapine (REMERON) 15 MG tablet Take 1 tablet (15 mg total) by mouth at bedtime. 12/30/17  Yes Lorella Nimrod, MD  Multiple Vitamins-Minerals (CENTRUM SILVER ADULT 50+) TABS Take 1 tablet by mouth daily.   Yes [provider]  nitroGLYCERIN (NITROSTAT) 0.4 MG SL tablet Place 1 tablet (0.4 mg total) under the tongue every 5 (five) minutes as needed for chest pain. x3 doses 12/30/17  Yes Lorella Nimrod, MD  OXYGEN Inhale 3-7 L/min into the lungs continuous.    Yes [provider]  polyethylene glycol (MIRALAX / GLYCOLAX) packet Take 17 g by mouth daily as needed for mild constipation.    Yes [provider]  tiotropium (SPIRIVA) 18 MCG inhalation capsule Place 1 capsule (18 mcg total) into inhaler and inhale daily. Patient taking differently: Place 18 mcg into inhaler and inhale at bedtime.  12/30/17  Yes Lorella Nimrod, MD  Zinc Oxide (DESITIN) 40 % PSTE Apply 1 application topically See admin instructions. Apply 1 application to sacral area daily after bath   Yes [provider]  ondansetron (ZOFRAN) 4 MG tablet Take 4 mg by mouth every 8 (eight) hours as needed for nausea or vomiting.    [provider]    Family History Family History  Problem Relation Age of Onset  . Heart attack Mother   . Heart attack Father   . Stroke Sister   . Heart attack Brother   . Heart attack  Sister     Social History Social History   Tobacco Use  . Smoking status: Former Smoker    Packs/day: 0.50    Years: 55.00    Pack years: 27.50    Types: Cigarettes  . Smokeless tobacco: Never Used  Substance Use Topics  . Alcohol use: Yes    Alcohol/week: 0.0 oz    Comment: Beer-rarely.  . Drug use: No     Allergies   Patient has no known allergies.   Review of Systems Review of Systems  Respiratory: Positive for shortness of breath.   Cardiovascular: Positive for chest pain.  All other systems reviewed and are negative.    Physical Exam Updated Vital Signs BP 93/71 (BP Location: Right Arm)   Pulse (!) 108   Temp 98.3 F (36.8 C) (Oral)   Resp (!) 22   SpO2 98%   Physical Exam  Constitutional: He is oriented to  person, place, and time. He appears well-developed and well-nourished.  HENT:  Head: Normocephalic and atraumatic.  Cardiovascular: Regular rhythm.  No murmur heard. tachcyardic  Pulmonary/Chest: He is in respiratory distress. He exhibits no tenderness.  Tachypnea. Good air movement in all lung fields. There are fine crackles in all lung fields, greatest in the basis bilaterally.  Abdominal: Soft. There is no tenderness. There is no rebound and no guarding.  Musculoskeletal: He exhibits no edema or tenderness.  Neurological: He is alert and oriented to person, place, and time.  Skin: Skin is warm and dry.  Psychiatric: He has a normal mood and affect. His behavior is normal.  Nursing note and vitals reviewed.    ED Treatments / Results  Labs (all labs ordered are listed, but only abnormal results are displayed) Labs Reviewed  BASIC METABOLIC PANEL - Abnormal; Notable for the following components:      Result Value   Potassium 3.1 (*)    Glucose, Bld 105 (*)    BUN <5 (*)    Calcium 8.4 (*)    All other components within normal limits  CBC WITH DIFFERENTIAL/PLATELET - Abnormal; Notable for the following components:   WBC 10.9 (*)    RBC  4.14 (*)    Hemoglobin 11.7 (*)    HCT 38.4 (*)    Neutro Abs 8.4 (*)    All other components within normal limits  HEPATIC FUNCTION PANEL - Abnormal; Notable for the following components:   Albumin 3.1 (*)    ALT 10 (*)    All other components within normal limits  BASIC METABOLIC PANEL - Abnormal; Notable for the following components:   Chloride 100 (*)    Glucose, Bld 232 (*)    BUN 5 (*)    Calcium 8.8 (*)    All other components within normal limits  CBC - Abnormal; Notable for the following components:   WBC 12.0 (*)    RBC 4.08 (*)    Hemoglobin 11.9 (*)    HCT 37.3 (*)    All other components within normal limits  I-STAT CHEM 8, ED - Abnormal; Notable for the following components:   Potassium 3.1 (*)    BUN 3 (*)    Creatinine, Ser 0.60 (*)    Glucose, Bld 107 (*)    Calcium, Ion 1.04 (*)    All other components within normal limits  CULTURE, BLOOD (ROUTINE X 2)  CULTURE, BLOOD (ROUTINE X 2)  BRAIN NATRIURETIC PEPTIDE  STREP PNEUMONIAE URINARY ANTIGEN  INFLUENZA PANEL BY PCR (TYPE A & B)  TROPONIN I  PROCALCITONIN  I-STAT TROPONIN, ED  I-STAT CG4 LACTIC ACID, ED    EKG EKG Interpretation  Date/Time:  Tuesday January 07 2018 16:35:48 EDT Ventricular Rate:  113 PR Interval:    QRS Duration: 85 QT Interval:  319 QTC Calculation: 438 R Axis:   14 Text Interpretation:  Sinus tachycardia Multiple premature complexes, vent & supraven Borderline repolarization abnormality Confirmed by Quintella Reichert [phone]) on 01/07/2018 4:47:27 PM   Radiology Ct Angio Chest Pe W/cm &/or Wo Cm  Result Date: 01/07/2018 CLINICAL DATA:  Chest pain. EXAM: CT ANGIOGRAPHY CHEST WITH CONTRAST TECHNIQUE: Multidetector CT imaging of the chest was performed using the standard protocol during bolus administration of intravenous contrast. Multiplanar CT image reconstructions and MIPs were obtained to evaluate the vascular anatomy. CONTRAST:  187mL ISOVUE-370 IOPAMIDOL (ISOVUE-370) INJECTION 76%  COMPARISON:  Chest radiograph from earlier today. 10/17/2017 chest CT. FINDINGS: Cardiovascular: The study is  moderate quality for the evaluation of pulmonary embolism, with evaluation of the segmental and subsegmental branches limited by motion degradation particularly in the lower lungs. There are no convincing filling defects in the central, lobar, segmental or subsegmental pulmonary artery branches to suggest acute pulmonary embolism. Great vessels are normal in course and caliber. Normal heart size. No significant pericardial fluid/thickening. Mediastinum/Nodes: No discrete thyroid nodules. Unremarkable esophagus. No axillary adenopathy. Mild right paratracheal adenopathy measuring up to 1.1 cm (series 5/image 38), new. No additional pathologically enlarged mediastinal nodes. No hilar adenopathy. Lungs/Pleura: No pneumothorax. No pleural effusion. Moderate centrilobular and paraseptal emphysema with mild diffuse bronchial wall thickening. Patchy consolidation throughout both lower lobes is largely new since 10/17/2017 chest CT. Less prominent patchy consolidation in the dependent upper lobes is mildly increased. Patchy ground-glass attenuation and reticulation in the mid to upper lungs, with the ground-glass component significantly decreased since 10/17/2017 chest CT. No frank honeycombing. No significant traction bronchiectasis. Upper abdomen: No acute abnormality. Musculoskeletal: No aggressive appearing focal osseous lesions. Moderate thoracic spondylosis. Review of the MIP images confirms the above findings. IMPRESSION: 1. No evidence of pulmonary embolism. 2. Patchy consolidation throughout both lower lobes, largely new since most recent chest CT of 10/17/2017. Mild patchy consolidation in the dependent upper lobes is mildly increased. Findings are most worrisome for multilobar pneumonia/aspiration. 3. Underlying patchy ground-glass opacities in both lungs, decreased from prior chest CT. Patchy  reticulation in both lungs, nonspecific, difficult to exclude underlying interstitial lung disease. Follow-up high-resolution chest CT may be obtained in 3-6 months as clinically warranted. 4. Mild right paratracheal lymphadenopathy is new and nonspecific, more likely reactive. 5. Moderate emphysema with mild diffuse bronchial wall thickening, suggesting COPD. Emphysema (ICD10-J43.9). Electronically Signed   By: Ilona Sorrel M.D.   On: 01/07/2018 18:49   Dg Chest Port 1 View  Result Date: 01/07/2018 CLINICAL DATA:  Chest pain and shortness of breath. EXAM: PORTABLE CHEST 1 VIEW COMPARISON:  Chest x-ray dated October 21, 2017. FINDINGS: The heart size and mediastinal contours are within normal limits. Normal pulmonary vascularity. Diffuse interstitial prominence throughout both lungs is similar to prior study. Areas of more confluent alveolar opacity are slightly improved. No consolidation, pneumothorax, or pleural effusion. No acute osseous abnormality. IMPRESSION: 1. Stable diffuse interstitial lung disease with partial resolution of more confluent alveolar opacities, likely reflecting resolving edema or infection. Electronically Signed   By: Titus Dubin M.D.   On: 01/07/2018 17:10    Procedures Procedures (including critical care time) CRITICAL CARE Performed by: Quintella Reichert   Total critical care time: 30 minutes  Critical care time was exclusive of separately billable procedures and treating other patients.  Critical care was necessary to treat or prevent imminent or life-threatening deterioration.  Critical care was time spent personally by me on the following activities: development of treatment plan with patient and/or surrogate as well as nursing, discussions with consultants, evaluation of patient's response to treatment, examination of patient, obtaining history from patient or surrogate, ordering and performing treatments and interventions, ordering and review of laboratory  studies, ordering and review of radiographic studies, pulse oximetry and re-evaluation of patient's condition.  Medications Ordered in ED Medications  nitroGLYCERIN (NITROSTAT) SL tablet 0.4 mg (0.4 mg Sublingual Given 01/07/18 1705)  iopamidol (ISOVUE-370) 76 % injection (has no administration in time range)  vancomycin (VANCOCIN) IVPB 750 mg/150 ml premix (has no administration in time range)  enoxaparin (LOVENOX) injection 40 mg (has no administration in time range)  sodium chloride flush (NS)  0.9 % injection 3 mL (has no administration in time range)  acetaminophen (TYLENOL) tablet 650 mg (has no administration in time range)    Or  acetaminophen (TYLENOL) suppository 650 mg (has no administration in time range)  polyethylene glycol (MIRALAX / GLYCOLAX) packet 17 g (has no administration in time range)  ceFEPIme (MAXIPIME) 1 g in sodium chloride 0.9 % 100 mL IVPB (has no administration in time range)  sodium chloride 0.9 % bolus 500 mL (has no administration in time range)  aspirin EC tablet 81 mg (has no administration in time range)  atorvastatin (LIPITOR) tablet 20 mg (has no administration in time range)  gabapentin (NEURONTIN) capsule 300 mg (has no administration in time range)  mirtazapine (REMERON) tablet 15 mg (has no administration in time range)  tiotropium (SPIRIVA) inhalation capsule 18 mcg (has no administration in time range)  potassium chloride (K-DUR,KLOR-CON) CR tablet 30 mEq (has no administration in time range)  morphine 4 MG/ML injection 2 mg (2 mg Intravenous Given 01/07/18 1705)  methylPREDNISolone sodium succinate (SOLU-MEDROL) 125 mg/2 mL injection 125 mg (125 mg Intravenous Given 01/07/18 1747)  iopamidol (ISOVUE-370) 76 % injection 100 mL (100 mLs Intravenous Contrast Given 01/07/18 1807)  ceFEPIme (MAXIPIME) 2 g in sodium chloride 0.9 % 100 mL IVPB (0 g Intravenous Stopped 01/07/18 2004)  sodium chloride 0.9 % bolus 500 mL (0 mLs Intravenous Stopped 01/07/18 2036)    morphine 4 MG/ML injection 2 mg (2 mg Intravenous Given 01/07/18 1934)  vancomycin (VANCOCIN) IVPB 1000 mg/200 mL premix (0 mg Intravenous Stopped 01/07/18 2114)     Initial Impression / Assessment and Plan / ED Course  I have reviewed the triage vital signs and the nursing notes.  Pertinent labs & imaging results that were available during my care of the patient were reviewed by me and considered in my medical decision making (see chart for details).     Patient with history of COPD, recent prolonged hospitalization for cholangitis is complicated by ARDS here for evaluation of sudden onset chest pain and shortness of breath. He is tachycardic and uncomfortable appearing on examination. Initial concern for possible pneumothorax versus PE. Chest x-ray with no acute change compared to priors. CTA PE study obtained. CT is negative for acute PE but does demonstrate multifocal pneumonia. Given his history of COPD he was treated with steroids for possible reactive airway inflammatory components. He was treated with IV fluids as well as broad-spectrum antibiotics for possible healthcare associated pneumonia. His pain did improve after pain medications and fluids in the emergency department. He does have good air movement bilaterally, no reasons therefore albuterol withheld. Medicine consulted for admission for further treatment.  Final Clinical Impressions(s) / ED Diagnoses   Final diagnoses:  HCAP (healthcare-associated pneumonia)  Precordial pain  Acute and chronic respiratory failure with hypoxia [facility])    ED Discharge Orders    None       Quintella Reichert, MD 01/08/18 0101

## 2018-01-08 ENCOUNTER — Other Ambulatory Visit: Payer: Self-pay

## 2018-01-08 DIAGNOSIS — I10 Essential (primary) hypertension: Secondary | ICD-10-CM | POA: Diagnosis not present

## 2018-01-08 DIAGNOSIS — J9611 Chronic respiratory failure with hypoxia: Secondary | ICD-10-CM | POA: Diagnosis present

## 2018-01-08 DIAGNOSIS — J189 Pneumonia, unspecified organism: Secondary | ICD-10-CM | POA: Insufficient documentation

## 2018-01-08 DIAGNOSIS — Z8719 Personal history of other diseases of the digestive system: Secondary | ICD-10-CM

## 2018-01-08 DIAGNOSIS — Z8249 Family history of ischemic heart disease and other diseases of the circulatory system: Secondary | ICD-10-CM | POA: Diagnosis not present

## 2018-01-08 DIAGNOSIS — F17211 Nicotine dependence, cigarettes, in remission: Secondary | ICD-10-CM | POA: Diagnosis not present

## 2018-01-08 DIAGNOSIS — M810 Age-related osteoporosis without current pathological fracture: Secondary | ICD-10-CM

## 2018-01-08 DIAGNOSIS — R0789 Other chest pain: Principal | ICD-10-CM

## 2018-01-08 DIAGNOSIS — Z9181 History of falling: Secondary | ICD-10-CM | POA: Diagnosis not present

## 2018-01-08 DIAGNOSIS — Z9889 Other specified postprocedural states: Secondary | ICD-10-CM | POA: Diagnosis not present

## 2018-01-08 DIAGNOSIS — E785 Hyperlipidemia, unspecified: Secondary | ICD-10-CM | POA: Diagnosis not present

## 2018-01-08 DIAGNOSIS — J449 Chronic obstructive pulmonary disease, unspecified: Secondary | ICD-10-CM

## 2018-01-08 LAB — BASIC METABOLIC PANEL
Anion gap: 12 (ref 5–15)
BUN: 5 mg/dL — AB (ref 6–20)
CALCIUM: 8.8 mg/dL — AB (ref 8.9–10.3)
CO2: 23 mmol/L (ref 22–32)
Chloride: 100 mmol/L — ABNORMAL LOW (ref 101–111)
Creatinine, Ser: 0.9 mg/dL (ref 0.61–1.24)
GFR calc Af Amer: >60 mL/min (ref >60)
GLUCOSE: 232 mg/dL — AB (ref 65–99)
Potassium: 3.5 mmol/L (ref 3.5–5.1)
SODIUM: 135 mmol/L (ref 135–145)

## 2018-01-08 LAB — INFLUENZA PANEL BY PCR (TYPE A & B)
INFLAPCR: NEGATIVE
Influenza B By PCR: NEGATIVE

## 2018-01-08 LAB — CBC
HEMATOCRIT: 37.3 % — AB (ref 39.0–52.0)
Hemoglobin: 11.9 g/dL — ABNORMAL LOW (ref 13.0–17.0)
MCH: 29.2 pg (ref 26.0–34.0)
MCHC: 31.9 g/dL (ref 30.0–36.0)
MCV: 91.4 fL (ref 78.0–100.0)
PLATELETS: 245 10*3/uL (ref 150–400)
RBC: 4.08 MIL/uL — ABNORMAL LOW (ref 4.22–5.81)
RDW: 14.1 % (ref 11.5–15.5)
WBC: 12 10*3/uL — AB (ref 4.0–10.5)

## 2018-01-08 LAB — TROPONIN I
Troponin I: 0.03 ng/mL (ref <0.03)
Troponin I: <0.03 ng/mL (ref <0.03)
Troponin I: <0.03 ng/mL (ref <0.03)

## 2018-01-08 LAB — PROCALCITONIN: Procalcitonin: <0.1 ng/mL

## 2018-01-08 LAB — STREP PNEUMONIAE URINARY ANTIGEN: STREP PNEUMO URINARY ANTIGEN: NEGATIVE

## 2018-01-08 MED ORDER — ENSURE ENLIVE PO LIQD
237.0000 mL | Freq: Two times a day (BID) | ORAL | Status: DC
Start: 2018-01-08 — End: 2018-01-08

## 2018-01-08 NOTE — Progress Notes (Signed)
Inpatient Diabetes Program Recommendations  AACE/ADA: New Consensus Statement on Inpatient Glycemic Control (2015)  Target Ranges:  Prepandial:   less than 140 mg/dL      Peak postprandial:   less than 180 mg/dL (1-2 hours)      Critically ill patients:  140 - 180 mg/dL   Lab Results  Component Value Date   GLUCAP 89 10/22/2017   HGBA1C 5.0 11/11/2012    Review of Glycemic Control Results for MONTREL, DONAHOE (MRN [id number]) as of 01/08/2018 09:51  Ref. Range 01/07/2018 16:45 01/07/2018 17:24 01/08/2018 00:23  Glucose Latest Ref Range: 65 - 99 mg/dL 105 (H) 107 (H) 232 (H)   Diabetes history: No known hx DM  Inpatient Diabetes Program Recommendations:   While on steroids and in the hospital: -Glycemic control order set with sensitive correction tid + 0-5 units hs -A1c to determine prehospital glycemic control  Thank you, Nani Gasser. Neeraj Housand, RN, MSN, CDE  Diabetes Coordinator Inpatient Glycemic Control Team Team Pager [phone] (8am-5pm) 01/08/2018 9:53 AM

## 2018-01-08 NOTE — Discharge Summary (Signed)
Name: [name] MRN: [id number] DOB: 1945/11/20 72 y.o. PCP: Lorella Nimrod, MD  Date of Admission: 01/07/2018  4:21 PM Date of Discharge: 01/08/2018 Attending Physician: Vevelyn Pat, MD  Discharge Diagnosis:  Principal Problem:   Atypical chest pain Active Problems:   Chronic hypoxemic respiratory failure [facility])   Discharge Medications: Allergies as of 01/08/2018   No Known Allergies     Medication List    TAKE these medications   acetaminophen 325 MG tablet Commonly known as:  TYLENOL Take 2 tablets (650 mg total) by mouth every 6 (six) hours as needed for mild pain or moderate pain.   albuterol 108 (90 Base) MCG/ACT inhaler Commonly known as:  PROAIR HFA INHALE 2 PUFFS INTO THE LUNGS FOUR TIMES DAILY   alendronate 70 MG tablet Commonly known as:  FOSAMAX Take 1 tablet (70 mg total) by mouth every 7 (seven) days. Take with a full glass of water on an empty stomach. What changed:    when to take this  additional instructions   aspirin EC 81 MG tablet Take 1 tablet (81 mg total) by mouth daily.   atorvastatin 20 MG tablet Commonly known as:  LIPITOR Take 1 tablet (20 mg total) by mouth daily. What changed:  when to take this   CENTRUM SILVER ADULT 50+ Tabs Take 1 tablet by mouth daily.   DESITIN 40 % Pste Generic drug:  Zinc Oxide Apply 1 application topically See admin instructions. Apply 1 application to sacral area daily after bath   doxazosin 1 MG tablet Commonly known as:  CARDURA Take 1 tablet (1 mg total) by mouth at bedtime.   gabapentin 300 MG capsule Commonly known as:  NEURONTIN Take 1 capsule (300 mg total) by mouth 2 (two) times daily.   mirtazapine 15 MG tablet Commonly known as:  REMERON Take 1 tablet (15 mg total) by mouth at bedtime.   nitroGLYCERIN 0.4 MG SL tablet Commonly known as:  NITROSTAT Place 1 tablet (0.4 mg total) under the tongue every 5 (five) minutes as needed for chest pain. x3 doses   ondansetron 4 MG  tablet Commonly known as:  ZOFRAN Take 4 mg by mouth every 8 (eight) hours as needed for nausea or vomiting.   OXYGEN Inhale 3-7 L/min into the lungs continuous.   polyethylene glycol packet Commonly known as:  MIRALAX / GLYCOLAX Take 17 g by mouth daily as needed for mild constipation.   tiotropium 18 MCG inhalation capsule Commonly known as:  SPIRIVA Place 1 capsule (18 mcg total) into inhaler and inhale daily. What changed:  when to take this       Disposition and follow-up:   [name] was discharged from [facility] in Stable condition.  At the hospital follow up visit please address:  Atypical chest pain: Chronic hypoxicrespiratory failure withprobable ILD: - Pain suspected to be muscle strain from PFTs - Ensure symptoms resolution - Ensure ordered referral to pulmonology for ILD is progressing  2.  Labs / imaging needed at time of follow-up: None  3.  Pending labs/ test needing follow-up: None  Follow-up Appointments: Follow-up [place] Follow up on 01/15/2018.   Why:  You have an appointment scheduled at 9:15 am, please call us if this appointment does not work for your schedule  Contact information: [address] [place] [phone]       Home, [facility].  Specialty:  Home Health Services Why:  They will do your home health care at your home Contact information: [address] [place] [address] [phone]           Hospital Course by problem list: Principal Problem:   Atypical chest pain Active Problems:   Chronic hypoxemic respiratory failure (HCC)   Atypical chest pain: Chronic hypoxicrespiratory failure withprobable ILD: ACS ruled out withnormal troponin and reassuring EKG.PE ruled out with CTA. Work up not concerning for Pneumonia. He experienced some shortness of breath, largely due to inabilty to take deep  breath due to associated pain. Patient's chest pain symptoms were relieved with pain medication had had nearly completely resolved by the following morning. He likely strained muscles while undergoing PFTs the morning prior to admission. No changes to home medication regimen.  Discharge Vitals:   BP 100/62 (BP Location: Left Arm)   Pulse (!) 105   Temp 97.6 F (36.4 C) (Oral)   Resp 20   Ht 5\' 7"  (1.702 m)   Wt 142 lb 1.6 oz (64.5 kg) Comment: c scale  SpO2 98%   BMI 22.26 kg/m   Pertinent Labs, Studies, and Procedures:  CBC Latest Ref Rng & Units 01/08/2018 01/07/2018 01/07/2018  WBC 4.0 - 10.5 K/uL 12.0(H) - 10.9(H)  Hemoglobin 13.0 - 17.0 g/dL 11.9(L) 13.3 11.7(L)  Hematocrit 39.0 - 52.0 % 37.3(L) 39.0 38.4(L)  Platelets 150 - 400 K/uL 245 - 243   BMP Latest Ref Rng & Units 01/08/2018 01/07/2018 01/07/2018  Glucose 65 - 99 mg/dL 232(H) 107(H) 105(H)  BUN 6 - 20 mg/dL 5(L) 3(L) <5(L)  Creatinine 0.61 - 1.24 mg/dL 0.90 0.60(L) 0.71  BUN/Creat Ratio 10 - 24 - - -  Sodium 135 - 145 mmol/L 135 139 138  Potassium 3.5 - 5.1 mmol/L 3.5 3.1(L) 3.1(L)  Chloride 101 - 111 mmol/L 100(L) 101 102  CO2 22 - 32 mmol/L 23 - 24  Calcium 8.9 - 10.3 mg/dL 8.8(L) - 8.4(L)   EKG on Admission:  EKG Interpretation  Date/Time:  Tuesday January 07 2018 16:35:48 EDT Ventricular Rate:  113 PR Interval:    QRS Duration: 85 QT Interval:  319 QTC Calculation: 438 R Axis:   14 Text Interpretation:  Sinus tachycardia Multiple premature complexes, vent & supraven Borderline repolarization abnormality Confirmed by Quintella Reichert [phone]) on 01/07/2018 4:47:27 PM      CXR: IMPRESSION: 1. Stable diffuse interstitial lung disease with partial resolution of more confluent alveolar opacities, likely reflecting resolving edema or infection.  CTA PE: IMPRESSION: 1. No evidence of pulmonary embolism. 2. Patchy consolidation throughout both lower lobes, largely new since most recent chest CT of 10/17/2017. Mild patchy  consolidation in the dependent upper lobes is mildly increased. Findings are most worrisome for multilobar pneumonia/aspiration. 3. Underlying patchy ground-glass opacities in both lungs, decreased from prior chest CT. Patchy reticulation in both lungs, nonspecific, difficult to exclude underlying interstitial lung disease. Follow-up high-resolution chest CT may be obtained in 3-6 months as clinically warranted. 4. Mild right paratracheal lymphadenopathy is new and nonspecific, more likely reactive. 5. Moderate emphysema with mild diffuse bronchial wall thickening, suggesting COPD.  Discharge Instructions: Discharge Instructions    (HEART FAILURE PATIENTS) Call MD:  Anytime you have any of the following symptoms: 1) 3 pound weight gain in 24 hours or 5 pounds in 1 week 2) shortness of breath, with or without a dry hacking cough 3) swelling in the hands, feet or stomach 4) if you have  to sleep on extra pillows at night in order to breathe.   Complete by:  As directed    Call MD for:  persistant dizziness or light-headedness   Complete by:  As directed    Call MD for:  persistant nausea and vomiting   Complete by:  As directed    Diet - low sodium heart healthy   Complete by:  As directed    Discharge instructions   Complete by:  As directed    Thank you for allowing [place] to care for you  Your symptoms of pain are believed to have been caused by strain after your breathing studies earlier in the day  Continue to take your current home medications as before  You have a referral in progress to see a lung doctor. You also have a scan ordered that will be scheduled to help look more closely at your lungs.  Please follow up in the clinic with your PCP on 01/15/18 at 9:15am   Increase activity slowly   Complete by:  As directed       Signed: Neva Seat, MD 01/10/2018, 2:29 PM   Pager: [phone]

## 2018-01-08 NOTE — Evaluation (Signed)
Clinical/Bedside Swallow Evaluation Patient Details  Name: [name] MRN: [id number] Date of Birth: [birth year]  Today's Date: 01/08/2018 Time: SLP Start Time (ACUTE ONLY): 1351 SLP Stop Time (ACUTE ONLY): 1417 SLP Time Calculation (min) (ACUTE ONLY): 26 min  Past Medical History:  Past Medical History:  Diagnosis Date  . Anxiety and depression   . COPD (chronic obstructive pulmonary disease) ([place])   . GERD (gastroesophageal reflux disease)   . HTN (hypertension)   . Hyperlipemia   . Migraine   . Personal history of alcoholism ([address])   . Tobacco use   . Tubular adenoma 01/29/2008   Past Surgical History:  Past Surgical History:  Procedure Laterality Date  . ANTERIOR FUSION CERVICAL SPINE  2005   two surgeries after two mechanical falls, per Dr. Donald Pore  . COLONOSCOPY W/ BIOPSIES  01/29/2008   Dr. Silvano Rusk  . ESOPHAGOGASTRODUODENOSCOPY N/A 10/15/2017   Procedure: ESOPHAGOGASTRODUODENOSCOPY (EGD);  Surgeon: Irene Shipper, MD;  Location: [facility] ENDOSCOPY;  Service: Endoscopy;  Laterality: N/A;  . [place]   correction for crossed-eyes  . HEMORRHOID SURGERY  1971   HPI:  Pt is a 72 yo with PMH of HTN, COPD, HLD, osteoporosis, and tobacco use who presents for evaluation of acute onset L sided chest pain. CT Chest revealed patchy consolidation through both lower lobes and some densities in dependent upper lobes, concerning for multilobar pneumonia and/or aspiration. Pt was seen by SLP during recent hospitalization at [facility] (10/22/17-11/18/17) with most recent MBS showing silent aspiration of thin liquids that was eliminated with use of chin tuck or nectar thick liquids. There was however concern that he could be at increased risk during meals if he became SOB, although he was described as being SOB during the study as well.   Assessment / Plan / Recommendation Clinical Impression  Pt has no overt s/s of aspiration, although recent imaging at [facility] reveals silent aspiration of thin  liquids, which was eliminated with use of a chin tuck. Pt says that he did not realize he was supposed to continue to use the chin tuck upon discharge, and therefore has not consistently been using it at home. SLP reinforced the rationale for it, particularly given concern for repeat PNA, as well as the importance of good oral hygiene. Pt demonstrated his ability to adequately use a chin tuck with Min cues from SLP to maintain the posture until the bolus is fully cleared. Would continue regular diet and thin liquids as pt does seem to remember to use the chin tuck well - he just did not realize it was still necessary. However, should he have persistent difficulty, MBS at [facility] also suggested use of nectar thick liquids in a head neutral position. Given that pt is discharging today, would defer additional f/u to [facility] or OP SLP. SLP Visit Diagnosis: Dysphagia, pharyngeal phase (R13.13)    Aspiration Risk  Mild aspiration risk;Moderate aspiration risk    Diet Recommendation Regular;Thin liquid   Liquid Administration via: Cup;Straw Medication Administration: Whole meds with puree Supervision: Patient able to self feed Compensations: Slow rate;Small sips/bites;Chin tuck Postural Changes: Seated upright at 90 degrees    Other  Recommendations Oral Care Recommendations: Oral care BID   Follow up Recommendations Home health SLP;Outpatient SLP      Frequency and Duration            Prognosis        Swallow Study   General HPI: Pt is a 72 yo with PMH  of HTN, COPD, HLD, osteoporosis, and tobacco use who presents for evaluation of acute onset L sided chest pain. CT Chest revealed patchy consolidation through both lower lobes and some densities in dependent upper lobes, concerning for multilobar pneumonia and/or aspiration. Pt was seen by SLP during recent hospitalization at [facility] (10/22/17-11/18/17) with most recent MBS showing silent aspiration of thin liquids that was eliminated with use of chin tuck  or nectar thick liquids. There was however concern that he could be at increased risk during meals if he became SOB, although he was described as being SOB during the study as well. Type of Study: Bedside Swallow Evaluation Previous Swallow Assessment: see HPI Diet Prior to this Study: Regular;Thin liquids Temperature Spikes Noted: No Respiratory Status: Nasal cannula History of Recent Intubation: No Behavior/Cognition: Alert;Cooperative;Pleasant mood Oral Cavity Assessment: Within Functional Limits Oral Care Completed by SLP: No Oral Cavity - Dentition: Edentulous Vision: Functional for self-feeding Self-Feeding Abilities: Able to feed self Patient Positioning: Upright in bed Baseline Vocal Quality: Normal Volitional Cough: Strong Volitional Swallow: Able to elicit    Oral/Motor/Sensory Function Overall Oral Motor/Sensory Function: Within functional limits   Ice Chips Ice chips: Not tested   Thin Liquid Thin Liquid: Within functional limits Presentation: Cup;Self Fed;Straw    Nectar Thick Nectar Thick Liquid: Not tested   Honey Thick Honey Thick Liquid: Not tested   Puree Puree: Within functional limits Presentation: Self Fed;Spoon   Solid   GO   Solid: Within functional limits Presentation: Self Ennis Forts 01/08/2018,2:46 PM  Germain Osgood, M.A. CCC-SLP [phone]

## 2018-01-08 NOTE — Consult Note (Signed)
   [facility] Inpatient Consult   01/08/2018  [name] 1945-11-19 [id number]  Patient screened for potential [place] Management services. Patient is in the [place] [facility] the [place] [facility] services under patient's[employer] Medicare plan. Met with the patient at the bedside with his cousin to transition to home.  Patient accepted the brochure, 24 hour nurse advise line and contact information.  Patient was assigned to COPD EMMI follow up..  For questions contact:   Natividad Brood, RN BSN [facility] Liaison  [phone] business mobile phone Toll free office [phone]

## 2018-01-08 NOTE — Care Management Note (Addendum)
Case Management Note  Patient Details  Name: [name] MRN: [id number] Date of Birth: 1945/11/29  Subjective/Objective: Pneumonia                  Action/Plan: Patient lives at home with his cousin; PCP Amin,Sumayya MD; has private insurance with [facility] with prescription drug coverage; pharmacy of choice is [facility]; Received call from [facility]- they were providing [facility] and HHPT for the patient but the agency will close in one week per Maxine Glenn ([phone]); CM talked to patient at the bedside to offer [place] choice; pt chose [facility]; [place] with [facility] called for arrangements; DME - home oxygen and walker at home; Attending MD at discharge please enter the face to face for [facility] services; CM will continue to follow for progression of care.     Expected Discharge Date:      Possibly 01/08/2018            Expected Discharge Plan:  [place]  In-House Referral:   [facility]  Discharge planning Services  CM Consult  Choice offered to:  Patient  HH Arranged:  RN, Disease Management, PT HH Agency:  [facility]  Status of Service:  In process, will continue to follow  Sherrilyn Rist [phone] 01/08/2018, 11:24 AM

## 2018-01-08 NOTE — Progress Notes (Signed)
Patient asking about discharge paged residents to ask about potential d/c.

## 2018-01-08 NOTE — Care Management Obs Status (Signed)
[place] NOTIFICATION   Patient Details  Name: [name] MRN: [id number] Date of Birth: January 19, 1946   Medicare Observation Status Notification Given:  Yes    Royston Bake, RN 01/08/2018, 3:29 PM

## 2018-01-08 NOTE — Progress Notes (Signed)
Pt is alert and oriented with no distress, family at bedside and expecting to be discharged today.

## 2018-01-08 NOTE — Progress Notes (Signed)
   Subjective: [name] was seen resting comfortably in his bed this morning. He was breathing well on his home oxygen. He states that he had very minimal pain remain today that he only noticed with deep breaths and certain movements.   Objective:  Vital signs in last 24 hours: Vitals:   01/08/18 0024 01/08/18 0100 01/08/18 0643 01/08/18 0758  BP:   (!) 92/52   Pulse:   90   Resp:      Temp:   98 F (36.7 C)   TempSrc:   Oral   SpO2:   100% 98%  Weight:  142 lb 1.6 oz (64.5 kg)    Height: 5\' 7"  (1.702 m)      Physical Exam  Constitutional: He appears well-developed and well-nourished. No distress.  HENT:  Head: Normocephalic and atraumatic.  Cardiovascular: Normal rate, regular rhythm, normal heart sounds and intact distal pulses.  Pulmonary/Chest: Effort normal. No respiratory distress.  Basilar Crackles  Abdominal: Soft. Bowel sounds are normal. He exhibits no distension. There is no tenderness.  Musculoskeletal: He exhibits no edema or deformity.  Skin: Skin is warm and dry.    Assessment/Plan: [name] is a 72 yo with PMH of HTN, COPD, HLD, osteoporosis, and tobacco use who presented with acute hypoxic respiratory failure and atypical chest pain.   Atypical chest pain: Chronic hypoxic respiratory failure with probable ILD: ACS ruled out with normal troponin and reassuring. PE ruled out with CTA. Do not suspect pneumonia as patient remain afebrile, has only mild leukocytosis (following steroids in ED), Negative Flu PCR, and negative Procalcitonin. He experienced some shortness of breath, largely due to inabilty to take deep breath due to associated pain. Patient's chest pain symptoms were relieved with pain medication had had nearly completely resolved this morning. He likely strained muscles while undergoing PFTs. - Follow up with pulmonology outpatient (referral in process) - Continue Spiriva - Albuterol PRN - Tylenol PRN for Pain  Dispo: Anticipated discharge  Today.  Neva Seat, MD 01/08/2018, 8:51 AM Pager: [phone]

## 2018-01-08 NOTE — Progress Notes (Signed)
CRITICAL VALUE ALERT  Critical Value: Troponin 0.03  Date & Time Notied:  01/08/2018 0128  Provider Notified: Dr. Berneice Gandy  Orders Received/Actions taken: no new orders

## 2018-01-08 NOTE — Care Management CC44 (Signed)
Condition Code 44 Documentation Completed  Patient Details  Name: WENCESLAO LOPER MRN: [id number] Date of Birth: 06/08/1946   Condition Code 44 given:  Yes Patient signature on Condition Code 44 notice:  Yes Documentation of 2 MD's agreement:  Yes Code 44 added to claim:  Yes    Royston Bake, RN 01/08/2018, 3:29 PM

## 2018-01-08 NOTE — Progress Notes (Signed)
Initial Nutrition Assessment  DOCUMENTATION CODES:   Non-severe (moderate) malnutrition in context of chronic illness  INTERVENTION:   -Continue Ensure Enlive po BID, each supplement provides 350 kcal and 20 grams of protein  NUTRITION DIAGNOSIS:   Moderate Malnutrition related to chronic illness(COPD) as evidenced by mild fat depletion, moderate fat depletion, mild muscle depletion, moderate muscle depletion.  GOAL:   Patient will meet greater than or equal to 90% of their needs  MONITOR:   PO intake, Supplement acceptance, Labs, Weight trends, Skin, I & O's  REASON FOR ASSESSMENT:   Malnutrition Screening Tool    ASSESSMENT:   [name] is a 72 yo with PMH of HTN, COPD, HLD, osteoporosis, and tobacco use who presented with acute hypoxic respiratory failure and atypical chest pain. His imaging on admission was consistent with multifocal pneumia/aspiration in the setting of chronic lung disease (COPD, ?insterstitial lung disease). He was started on broad spectrum antibiotics on admission and admitted to the internal medicine teaching service for management.   Pt admitted with HCAP.   Spoke with pt at bedside, who reports good appetite presently and for the past 3-4 weeks. He shares with this RD that prior to a month ago, he had just returned home from an extensive hospitalization at [facility], which involved a rehab stay before returning home; pt also shares that he was dependent on a feeding tube for 1-1.5 months during this time period. Pt now consumes 3 meals and 3 snacks per day (Breakfast: grits, eggs, and sausage, Lunch: sandwich, Dinner: meat, starch, and vegetables; snacks include peanut butter crackers and fruits. He also consumes one Ensure supplement daily).   Pt reports UBW of around 180#. He estimates a 30-403 wt loss over the past 6 months, which he believes is related to poor oral intake and fluid losses. Noted pt has experienced a 16.4% wt loss over the past 6  months, which is significant for time frame.   Noted pt consumed 100% of lunch meal. He also reports to consuming an Ensure supplement earlier. Educated pt on importance of good meal and supplement intake to promote healing. Encouraged pt to continue use of supplements at home.   Medications reviewed and include remeron.   Labs reviewed.   NUTRITION - FOCUSED PHYSICAL EXAM:    Most Recent Value  Orbital Region  Moderate depletion  Upper Arm Region  Moderate depletion  Thoracic and Lumbar Region  Mild depletion  Buccal Region  Mild depletion  Temple Region  Mild depletion  Clavicle Bone Region  Moderate depletion  Clavicle and Acromion Bone Region  Moderate depletion  Scapular Bone Region  Moderate depletion  Dorsal Hand  Moderate depletion  Patellar Region  Moderate depletion  Anterior Thigh Region  Moderate depletion  Posterior Calf Region  Moderate depletion  Edema (RD Assessment)  None  Hair  Reviewed  Eyes  Reviewed  Mouth  Reviewed  Skin  Reviewed  Nails  Reviewed       Diet Order:  Diet Heart Room service appropriate? Yes; Fluid consistency: Thin Diet - low sodium heart healthy  EDUCATION NEEDS:   Education needs have been addressed  Skin:  Skin Assessment: Reviewed RN Assessment  Last BM:  01/07/18  Height:   Ht Readings from Last 1 Encounters:  01/08/18 5\' 7"  (1.702 m)    Weight:   Wt Readings from Last 1 Encounters:  01/08/18 142 lb 1.6 oz (64.5 kg)    Ideal Body Weight:  67.2 kg  BMI:  Body  mass index is 22.26 kg/m.  Estimated Nutritional Needs:   Kcal:  1900-2100  Protein:  95-110 grams  Fluid:  1.9-2.1 L    Keiden Deskin A. Jimmye Norman, RD, LDN, CDE Pager: [phone] After hours Pager: [phone]

## 2018-01-10 DIAGNOSIS — J9611 Chronic respiratory failure with hypoxia: Secondary | ICD-10-CM | POA: Diagnosis not present

## 2018-01-10 DIAGNOSIS — J449 Chronic obstructive pulmonary disease, unspecified: Secondary | ICD-10-CM | POA: Diagnosis not present

## 2018-01-10 DIAGNOSIS — R1313 Dysphagia, pharyngeal phase: Secondary | ICD-10-CM | POA: Diagnosis not present

## 2018-01-10 DIAGNOSIS — E441 Mild protein-calorie malnutrition: Secondary | ICD-10-CM | POA: Diagnosis not present

## 2018-01-10 DIAGNOSIS — I10 Essential (primary) hypertension: Secondary | ICD-10-CM | POA: Diagnosis not present

## 2018-01-10 DIAGNOSIS — M810 Age-related osteoporosis without current pathological fracture: Secondary | ICD-10-CM | POA: Diagnosis not present

## 2018-01-10 DIAGNOSIS — R590 Localized enlarged lymph nodes: Secondary | ICD-10-CM | POA: Diagnosis not present

## 2018-01-10 DIAGNOSIS — E785 Hyperlipidemia, unspecified: Secondary | ICD-10-CM | POA: Diagnosis not present

## 2018-01-10 DIAGNOSIS — K7290 Hepatic failure, unspecified without coma: Secondary | ICD-10-CM | POA: Diagnosis not present

## 2018-01-10 DIAGNOSIS — G4733 Obstructive sleep apnea (adult) (pediatric): Secondary | ICD-10-CM | POA: Diagnosis not present

## 2018-01-10 DIAGNOSIS — G43009 Migraine without aura, not intractable, without status migrainosus: Secondary | ICD-10-CM | POA: Diagnosis not present

## 2018-01-11 DIAGNOSIS — R092 Respiratory arrest: Secondary | ICD-10-CM | POA: Diagnosis not present

## 2018-01-11 DIAGNOSIS — J9611 Chronic respiratory failure with hypoxia: Secondary | ICD-10-CM | POA: Diagnosis not present

## 2018-01-11 DIAGNOSIS — M4856XD Collapsed vertebra, not elsewhere classified, lumbar region, subsequent encounter for fracture with routine healing: Secondary | ICD-10-CM | POA: Diagnosis not present

## 2018-01-11 DIAGNOSIS — J449 Chronic obstructive pulmonary disease, unspecified: Secondary | ICD-10-CM | POA: Diagnosis not present

## 2018-01-11 DIAGNOSIS — J849 Interstitial pulmonary disease, unspecified: Secondary | ICD-10-CM | POA: Diagnosis not present

## 2018-01-12 LAB — CULTURE, BLOOD (ROUTINE X 2)
Culture: NO GROWTH
Culture: NO GROWTH
SPECIAL REQUESTS: ADEQUATE
SPECIAL REQUESTS: ADEQUATE

## 2018-01-13 ENCOUNTER — Telehealth: Payer: Self-pay | Admitting: Internal Medicine

## 2018-01-13 ENCOUNTER — Ambulatory Visit (INDEPENDENT_AMBULATORY_CARE_PROVIDER_SITE_OTHER): Payer: Medicare Other | Admitting: Internal Medicine

## 2018-01-13 ENCOUNTER — Encounter: Payer: Self-pay | Admitting: Internal Medicine

## 2018-01-13 VITALS — BP 114/87 | HR 104 | Temp 98.0°F | Wt 145.5 lb

## 2018-01-13 DIAGNOSIS — M810 Age-related osteoporosis without current pathological fracture: Secondary | ICD-10-CM | POA: Diagnosis not present

## 2018-01-13 DIAGNOSIS — K8070 Calculus of gallbladder and bile duct without cholecystitis without obstruction: Secondary | ICD-10-CM | POA: Diagnosis not present

## 2018-01-13 DIAGNOSIS — Z7982 Long term (current) use of aspirin: Secondary | ICD-10-CM | POA: Diagnosis not present

## 2018-01-13 DIAGNOSIS — E785 Hyperlipidemia, unspecified: Secondary | ICD-10-CM

## 2018-01-13 DIAGNOSIS — T17900A Unspecified foreign body in respiratory tract, part unspecified causing asphyxiation, initial encounter: Secondary | ICD-10-CM | POA: Insufficient documentation

## 2018-01-13 DIAGNOSIS — J438 Other emphysema: Secondary | ICD-10-CM

## 2018-01-13 DIAGNOSIS — Z09 Encounter for follow-up examination after completed treatment for conditions other than malignant neoplasm: Secondary | ICD-10-CM | POA: Diagnosis not present

## 2018-01-13 DIAGNOSIS — Z79899 Other long term (current) drug therapy: Secondary | ICD-10-CM

## 2018-01-13 DIAGNOSIS — Z9981 Dependence on supplemental oxygen: Secondary | ICD-10-CM

## 2018-01-13 DIAGNOSIS — Z8739 Personal history of other diseases of the musculoskeletal system and connective tissue: Secondary | ICD-10-CM | POA: Diagnosis not present

## 2018-01-13 DIAGNOSIS — J9611 Chronic respiratory failure with hypoxia: Secondary | ICD-10-CM | POA: Diagnosis not present

## 2018-01-13 DIAGNOSIS — K8020 Calculus of gallbladder without cholecystitis without obstruction: Secondary | ICD-10-CM | POA: Insufficient documentation

## 2018-01-13 DIAGNOSIS — T17900S Unspecified foreign body in respiratory tract, part unspecified causing asphyxiation, sequela: Secondary | ICD-10-CM

## 2018-01-13 DIAGNOSIS — R0789 Other chest pain: Secondary | ICD-10-CM

## 2018-01-13 DIAGNOSIS — M6258 Muscle wasting and atrophy, not elsewhere classified, other site: Secondary | ICD-10-CM | POA: Diagnosis not present

## 2018-01-13 DIAGNOSIS — R1310 Dysphagia, unspecified: Secondary | ICD-10-CM | POA: Insufficient documentation

## 2018-01-13 NOTE — Progress Notes (Signed)
    CC: HFU of atypical chest pain  HPI:  [name] is a 72 y.o. M with chronic respiratory failure on 3.5L continuously, suspected ILD, COPD, prolonged admission earlier this year for liver failure secondary to choledocholithiasis s/p ERCP, HLD and osteoporosis who presents today for HFU for atypical CP. For details regarding today's visit and the status of their chronic medical issues, please refer to the assessment and plan.  Past Medical History:  Diagnosis Date  . Anxiety and depression   . COPD (chronic obstructive pulmonary disease) ([place])   . GERD (gastroesophageal reflux disease)   . HTN (hypertension)   . Hyperlipemia   . Migraine   . Personal history of alcoholism ([place])   . Tobacco use   . Tubular adenoma 01/29/2008   Review of Systems:   General: Denies fevers, chills, weight loss, fatigue HEENT: Denies acute changes in vision, sore throat, dysphagia Cardiac: Denies CP, SOB, palpitations Pulmonary: Denies cough, wheezes, PND Abd: Denies abdominal pain, changes in bowels Extremities: Denies weakness or swelling  Physical Exam: General: Alert, in no acute distress. Chronically-ill appearing. Temporal wasting. HEENT: No icterus, injection or ptosis. No hoarseness or dysarthria  Cardiac: RRR, no MGR appreciated Pulmonary: Lungs grossly clear and breathing comfortably on home 3.5L O2.  Abd: Soft, non-tender. +bs Extremities: Warm, perfused. No significant pedal edema.    Vitals:   01/13/18 1303  BP: 114/87  Pulse: (!) 104  Temp: 98 F (36.7 C)  TempSrc: Oral  SpO2: 100%  Weight: 145 lb 8 oz (66 kg)   Body mass index is 22.79 kg/m.  Assessment & Plan:   See Encounters Tab for problem based charting.  Patient discussed with Dr. Eppie Gibson

## 2018-01-13 NOTE — Telephone Encounter (Signed)
Spoke with [place] from [facility]. Verbal authorization given for Nursing Eval, as well as PT/OT Eval. [name] has orders for Speech therapy and is requesting 3 visits per month for 1 month. Needs Dx code for this. Dysphagia not listed on problem list or in notes from [place]. Will route to PCP. [name], RN, BSN

## 2018-01-13 NOTE — Assessment & Plan Note (Signed)
Assessment: [name] has not had recurrence of his atypical CP since admission. Symptoms began while completing PFTs for outpatient evaluation of suspected ILD and were worsened with movement and palpation. ACS r/o with cycled troponins, reassuring EKG and central PE was not appreciated on CTA.   Plan: Continue current management with ASA 81 mg daily, statin and nitro PRN.

## 2018-01-13 NOTE — Assessment & Plan Note (Addendum)
Assessment & Plan: This was observed during his admission earlier this year at [facility] and was managed with "chin tuck." He was re-evaluated by SLP during his admission 4/2 and reported he was unaware he was supposed to continue this outside of the hospital. His CT actually did show new BL LL patchy infiltrates, but was not treated for aspiration pneumonia given his lack of leukocytosis, fever or systemic symptoms and also negative procalcitonin. He is receiving SLP at home.

## 2018-01-13 NOTE — Assessment & Plan Note (Signed)
Assessment & Plan: Patient has an appointment with GI as well as general surgery to discuss lap chole.

## 2018-01-13 NOTE — Patient Instructions (Addendum)
It was great seeing you today. I'm sorry you had to go back in the hospital for a night.   Today we talked about your chest pain. I'm glad you havent had any more since discharge! It was most likely musculoskeletal related to you taking deep breaths for the lung tests.   I have provided you with a spacer for your inhaler.  You have an appointment coming up with the lung doctor, and you have already been referred to a surgeon to discuss removing your gallbladder. They will call you with an appointment.

## 2018-01-13 NOTE — Telephone Encounter (Signed)
[name] with [facility], needs orders   -nurse evu, pt evu , ot evu  Speech evu 3x for 1 month .  Verify a dx dysphagia

## 2018-01-13 NOTE — Assessment & Plan Note (Addendum)
Assessment: He actually presented today without his oxygen and was hypoxic, but this resolved with administration of his home 3.5L O2. Recent PFTs showed severely reduced diffusion  CTA obtained during his recent admission was without cenetral PE but did show moderate centrilobular and paraseptal emphysma and mild diffuse bronchial wall thickening. There was actually largely new patchy consodilation throughout BL LL which raised suspicion for aspiration and or pnumonia. He was not started on antibiotics given his minimal white count, lack of fever and negative procalcitonin. Influenza panel negative. He does have history of silent aspiration based on records from his [facility]mission earlier this year which was managed with "chin-tuck" but patient unaware this was to be continued after dc.  Plan: Patient has follow-up scheduled with Dr. Chase Caller within the next few weeks. He is to continue Spiriva, supplemental O2 and I also provided him with a spacer for his albuterol inhaler. He was provided a portable oxygen tank here in clinic which he was sent home with. Patient to continue the "chin tuck" and will RTC if he were to develop fevers, chills or worsened respiratory status.

## 2018-01-14 NOTE — Progress Notes (Signed)
Case discussed with Dr. Molt at the time of the visit.  We reviewed the resident's history and exam and pertinent patient test results.  I agree with the assessment, diagnosis and plan of care documented in the resident's note. 

## 2018-01-14 NOTE — Telephone Encounter (Signed)
Diagnosis is dysphagia, pharyngeal phase (R13.13) which puts him at moderate aspiration risk. Thanks.

## 2018-01-15 ENCOUNTER — Other Ambulatory Visit: Payer: Self-pay

## 2018-01-15 ENCOUNTER — Ambulatory Visit: Payer: Medicare Other

## 2018-01-15 NOTE — Telephone Encounter (Signed)
Called Cathy at [place] [facility] - informed "Diagnosis is dysphagia, pharyngeal phase (R13.13) which puts him at moderate aspiration risk." per Dr Evette Doffing. Stated thank you for calling.

## 2018-01-15 NOTE — Telephone Encounter (Signed)
Called pt -stated no refills on his medications. I asked if he had called [facility] - stated he did.  I called [facility] on [address] - pt does have refills on file. Stated they will get his meds ready (Gabapentin, Cardura, Remeron, Spirivia and Lipitor).  Called pt back to let him know about refills; should be ready in about 90 mins - stated ok and thank you.

## 2018-01-15 NOTE — Patient Outreach (Signed)
[name]) Care Management  01/15/2018  [name] 18-Aug-1946 [id number]     EMMI-COPD RED ON EMMI ALERT Day # 5 Date: 01/14/18 Red Alert Reason: " Feeling worse overall? Yes  Coughing more than yesterday? Yes  Mucus change color? Yes  # of times rescue inhaler used in past 24 hrs? 6"    Outreach attempt # 1. No answer at present. Voicemail box full and unable to leave message.         Plan: RN CM will make outreach attempt to patient within three business days.   [name], RN,BSN,CCM [place] Management Telephonic Care Management Coordinator Direct Phone: [phone] Toll Free: [phone] Fax: [phone]

## 2018-01-15 NOTE — Telephone Encounter (Signed)
Requesting all meds to be filled. Pt states he is out for 2 days, requesting med by today. Pt is using [facility] on [address].

## 2018-01-16 ENCOUNTER — Other Ambulatory Visit: Payer: Self-pay

## 2018-01-16 ENCOUNTER — Telehealth: Payer: Self-pay | Admitting: Internal Medicine

## 2018-01-16 DIAGNOSIS — J449 Chronic obstructive pulmonary disease, unspecified: Secondary | ICD-10-CM | POA: Diagnosis not present

## 2018-01-16 DIAGNOSIS — M810 Age-related osteoporosis without current pathological fracture: Secondary | ICD-10-CM | POA: Diagnosis not present

## 2018-01-16 DIAGNOSIS — K7290 Hepatic failure, unspecified without coma: Secondary | ICD-10-CM | POA: Diagnosis not present

## 2018-01-16 DIAGNOSIS — G43009 Migraine without aura, not intractable, without status migrainosus: Secondary | ICD-10-CM | POA: Diagnosis not present

## 2018-01-16 DIAGNOSIS — E441 Mild protein-calorie malnutrition: Secondary | ICD-10-CM | POA: Diagnosis not present

## 2018-01-16 DIAGNOSIS — J9611 Chronic respiratory failure with hypoxia: Secondary | ICD-10-CM | POA: Diagnosis not present

## 2018-01-16 DIAGNOSIS — G4733 Obstructive sleep apnea (adult) (pediatric): Secondary | ICD-10-CM | POA: Diagnosis not present

## 2018-01-16 DIAGNOSIS — E785 Hyperlipidemia, unspecified: Secondary | ICD-10-CM | POA: Diagnosis not present

## 2018-01-16 DIAGNOSIS — R590 Localized enlarged lymph nodes: Secondary | ICD-10-CM | POA: Diagnosis not present

## 2018-01-16 DIAGNOSIS — R1313 Dysphagia, pharyngeal phase: Secondary | ICD-10-CM | POA: Diagnosis not present

## 2018-01-16 DIAGNOSIS — I10 Essential (primary) hypertension: Secondary | ICD-10-CM | POA: Diagnosis not present

## 2018-01-16 NOTE — Telephone Encounter (Signed)
Called HHN back and Vo given for [facility] request. Assured her that his cardiac issues had and were being addressed. Sending to dr Reesa Chew and molt as Juluis Rainier

## 2018-01-16 NOTE — Patient Outreach (Signed)
[facility]) Care Management  01/16/2018  [name] 03-12-1946 [id number]   EMMI-COPD RED ON EMMI ALERT Day # 5 Date: 01/14/18 Red Alert Reason: " Feeling worse overall? Yes  Coughing more than yesterday? Yes  Mucus change color? Yes  # of times rescue inhaler used in past 24 hrs? 6"    Outreach attempt #2 to patient. Spoke with patient. He voices he is doing and feeling much better since return home. Reviewed and addressed red alerts with patient. He reports that he did not feel "too good' on Monday but has been feeling better ever since. He voices that his coughing is improved. He states he has an occasional productive cough of clear sputum at times. RN CM reviewed with patient s/s of infection, worsening condition and when to seek medical attention. He voiced understanding. He stets that he has not be using inhalers that often. He is taking Spiriva as scheduled and uses his Pro-Air only maybe 1-2/day and some days he doesn't need it at all. He denies ay worsening respiratory symptoms. He had PCP follow up appt on 01/13/18. He voices that [facility] are in place and someone coming out today to see him. He denies any further RN CM needs or concerns at this time. Advised patient that they would continue to get automated EMMI- COPD post discharge calls to assess how they are doing following recent hospitalization and will receive a call from a nurse if any of their responses were abnormal. Patient voiced understanding and was appreciative of f/u call.      Plan: RN CM will close case as no further interventions needed at this time.  [name], RN,BSN,CCM [place] Management Telephonic Care Management Coordinator Direct Phone: [phone] Toll Free: [phone] Fax: [phone]

## 2018-01-16 NOTE — Telephone Encounter (Signed)
Request from [phone] requesting  A VO for  2 x week for 4 weeks and 1 x week for 4 for  Cardiopulmonary Education and a Request for a possible Echo.  Please call Nurse back.

## 2018-01-17 NOTE — Telephone Encounter (Signed)
Thanks Helen  

## 2018-01-19 NOTE — Telephone Encounter (Signed)
Thanks. Alisea Matte 

## 2018-01-20 ENCOUNTER — Telehealth: Payer: Self-pay | Admitting: Internal Medicine

## 2018-01-20 ENCOUNTER — Telehealth: Payer: Self-pay

## 2018-01-20 ENCOUNTER — Other Ambulatory Visit: Payer: Self-pay

## 2018-01-20 DIAGNOSIS — E785 Hyperlipidemia, unspecified: Secondary | ICD-10-CM | POA: Diagnosis not present

## 2018-01-20 DIAGNOSIS — M810 Age-related osteoporosis without current pathological fracture: Secondary | ICD-10-CM | POA: Diagnosis not present

## 2018-01-20 DIAGNOSIS — R590 Localized enlarged lymph nodes: Secondary | ICD-10-CM | POA: Diagnosis not present

## 2018-01-20 DIAGNOSIS — E441 Mild protein-calorie malnutrition: Secondary | ICD-10-CM | POA: Diagnosis not present

## 2018-01-20 DIAGNOSIS — K7290 Hepatic failure, unspecified without coma: Secondary | ICD-10-CM | POA: Diagnosis not present

## 2018-01-20 DIAGNOSIS — J9611 Chronic respiratory failure with hypoxia: Secondary | ICD-10-CM | POA: Diagnosis not present

## 2018-01-20 DIAGNOSIS — R1313 Dysphagia, pharyngeal phase: Secondary | ICD-10-CM | POA: Diagnosis not present

## 2018-01-20 DIAGNOSIS — J449 Chronic obstructive pulmonary disease, unspecified: Secondary | ICD-10-CM | POA: Diagnosis not present

## 2018-01-20 DIAGNOSIS — I10 Essential (primary) hypertension: Secondary | ICD-10-CM | POA: Diagnosis not present

## 2018-01-20 DIAGNOSIS — G4733 Obstructive sleep apnea (adult) (pediatric): Secondary | ICD-10-CM | POA: Diagnosis not present

## 2018-01-20 DIAGNOSIS — G43009 Migraine without aura, not intractable, without status migrainosus: Secondary | ICD-10-CM | POA: Diagnosis not present

## 2018-01-20 NOTE — Telephone Encounter (Signed)
Jim with [facility] requesting VO. Please call back.

## 2018-01-20 NOTE — Telephone Encounter (Signed)
That's fine.  [name]

## 2018-01-20 NOTE — Telephone Encounter (Signed)
Jim(OT from [facility]) requesting VO for 2x/week for 3 wks, then 1x/week for 1 wk. VO given, do you agree?

## 2018-01-20 NOTE — Telephone Encounter (Signed)
[name] at [facility] therapy twice a week for 5 weeks

## 2018-01-20 NOTE — Telephone Encounter (Signed)
That's fine. Thanks for helping this patient.  [place]

## 2018-01-20 NOTE — Telephone Encounter (Signed)
Dr. Reesa Chew, is it ok to give VO for PT? Thank you!

## 2018-01-20 NOTE — Telephone Encounter (Signed)
Return call to Clair Gulling - no answer; left message to call us back.

## 2018-01-20 NOTE — Patient Outreach (Signed)
[facility]) Care Management  01/20/2018  [name] Jun 22, 1946 [id number]     EMMI-COPD RED ON EMMI ALERT Day # 8 Date: 01/17/18 Red Alert Reason:" # of times rescue inhaler used in past 24 hrs? 4"   Outreach attempt # 1 to patient. Spoke with patient who voices he is doing fairly well. Reviewed and addressed red alert with patient. He reports that he did use the inhaler that many times for one day only. He reports that he normally only uses it about once per day. He attributes the increased usage from going outside and sitting on the porch. Patient states that he has been advised that this is not good for his lungs due to the heat and pollen issues. RN CM reinforced this and explained how this negatively affects his breathing. Encouraged patient to limit outdoor time and to consider using a mask if possible. He is aware and states he has a mask. He denies any other issues at this time.      Plan: RN CM will close case at this time as no further interventions needed at this time.   [name], RN,BSN,CCM [place] Management Telephonic Care Management Coordinator Direct Phone: [phone] Toll Free: [phone] Fax: [phone]

## 2018-01-21 ENCOUNTER — Other Ambulatory Visit: Payer: Self-pay

## 2018-01-21 NOTE — Telephone Encounter (Signed)
Attempted to reach [facility] to give VO for [facility] PT. VM is unidentified. HIPPA compliant message left requesting return call. [name], RN, BSN

## 2018-01-21 NOTE — Patient Outreach (Signed)
[facility]) Care Management  01/21/2018  [name] 1945-11-22 [id number]     EMMI-COPD RED ON EMMI ALERT Day # 11 Date: 01/20/18 Red Alert Reason: "Sad, hopeless, anxious or empty? Yes"   Outreach attempt # 1 to patient. Spoke with patient. He voices that he is feeling pretty good today. Reviewed and addressed red alert with patient. He voices he felt a little down the other day but denies still having those feelings. He voices that he just "got to thinking about everything" and it made him feel a little sad. RN CM attempted to get patient to discuss this in further detail but he declined. He voiced that it was not necessary and that he was feeling better. No further RN CM needs or concerns at this time.        Plan: RN CM will close case as no further interventions needed at this time.   [name], RN,BSN,CCM [place] Management Telephonic Care Management Coordinator Direct Phone: [phone] Toll Free: [phone] Fax: [phone]

## 2018-01-21 NOTE — Telephone Encounter (Signed)
Flor returned call requesting [place] PT twice week x 5 weeks. Verbal auth given. Will forward to PCP to see if we are in agreement. [name], RN, BSN

## 2018-01-22 ENCOUNTER — Other Ambulatory Visit: Payer: Self-pay | Admitting: Surgery

## 2018-01-22 DIAGNOSIS — K8020 Calculus of gallbladder without cholecystitis without obstruction: Secondary | ICD-10-CM | POA: Diagnosis not present

## 2018-01-23 DIAGNOSIS — E785 Hyperlipidemia, unspecified: Secondary | ICD-10-CM | POA: Diagnosis not present

## 2018-01-23 DIAGNOSIS — E441 Mild protein-calorie malnutrition: Secondary | ICD-10-CM | POA: Diagnosis not present

## 2018-01-23 DIAGNOSIS — J449 Chronic obstructive pulmonary disease, unspecified: Secondary | ICD-10-CM | POA: Diagnosis not present

## 2018-01-23 DIAGNOSIS — M810 Age-related osteoporosis without current pathological fracture: Secondary | ICD-10-CM | POA: Diagnosis not present

## 2018-01-23 DIAGNOSIS — I10 Essential (primary) hypertension: Secondary | ICD-10-CM | POA: Diagnosis not present

## 2018-01-23 DIAGNOSIS — R1313 Dysphagia, pharyngeal phase: Secondary | ICD-10-CM | POA: Diagnosis not present

## 2018-01-23 DIAGNOSIS — R590 Localized enlarged lymph nodes: Secondary | ICD-10-CM | POA: Diagnosis not present

## 2018-01-23 DIAGNOSIS — K7290 Hepatic failure, unspecified without coma: Secondary | ICD-10-CM | POA: Diagnosis not present

## 2018-01-23 DIAGNOSIS — G4733 Obstructive sleep apnea (adult) (pediatric): Secondary | ICD-10-CM | POA: Diagnosis not present

## 2018-01-23 DIAGNOSIS — J9611 Chronic respiratory failure with hypoxia: Secondary | ICD-10-CM | POA: Diagnosis not present

## 2018-01-23 DIAGNOSIS — G43009 Migraine without aura, not intractable, without status migrainosus: Secondary | ICD-10-CM | POA: Diagnosis not present

## 2018-01-24 DIAGNOSIS — M810 Age-related osteoporosis without current pathological fracture: Secondary | ICD-10-CM | POA: Diagnosis not present

## 2018-01-24 DIAGNOSIS — R1313 Dysphagia, pharyngeal phase: Secondary | ICD-10-CM | POA: Diagnosis not present

## 2018-01-24 DIAGNOSIS — J9611 Chronic respiratory failure with hypoxia: Secondary | ICD-10-CM | POA: Diagnosis not present

## 2018-01-24 DIAGNOSIS — G43009 Migraine without aura, not intractable, without status migrainosus: Secondary | ICD-10-CM | POA: Diagnosis not present

## 2018-01-24 DIAGNOSIS — E441 Mild protein-calorie malnutrition: Secondary | ICD-10-CM | POA: Diagnosis not present

## 2018-01-24 DIAGNOSIS — R590 Localized enlarged lymph nodes: Secondary | ICD-10-CM | POA: Diagnosis not present

## 2018-01-24 DIAGNOSIS — E785 Hyperlipidemia, unspecified: Secondary | ICD-10-CM | POA: Diagnosis not present

## 2018-01-24 DIAGNOSIS — K7290 Hepatic failure, unspecified without coma: Secondary | ICD-10-CM | POA: Diagnosis not present

## 2018-01-24 DIAGNOSIS — J449 Chronic obstructive pulmonary disease, unspecified: Secondary | ICD-10-CM | POA: Diagnosis not present

## 2018-01-24 DIAGNOSIS — G4733 Obstructive sleep apnea (adult) (pediatric): Secondary | ICD-10-CM | POA: Diagnosis not present

## 2018-01-24 DIAGNOSIS — I10 Essential (primary) hypertension: Secondary | ICD-10-CM | POA: Diagnosis not present

## 2018-01-27 DIAGNOSIS — G4733 Obstructive sleep apnea (adult) (pediatric): Secondary | ICD-10-CM | POA: Diagnosis not present

## 2018-01-27 DIAGNOSIS — G43009 Migraine without aura, not intractable, without status migrainosus: Secondary | ICD-10-CM | POA: Diagnosis not present

## 2018-01-27 DIAGNOSIS — E785 Hyperlipidemia, unspecified: Secondary | ICD-10-CM | POA: Diagnosis not present

## 2018-01-27 DIAGNOSIS — R590 Localized enlarged lymph nodes: Secondary | ICD-10-CM | POA: Diagnosis not present

## 2018-01-27 DIAGNOSIS — K7290 Hepatic failure, unspecified without coma: Secondary | ICD-10-CM | POA: Diagnosis not present

## 2018-01-27 DIAGNOSIS — I10 Essential (primary) hypertension: Secondary | ICD-10-CM | POA: Diagnosis not present

## 2018-01-27 DIAGNOSIS — J449 Chronic obstructive pulmonary disease, unspecified: Secondary | ICD-10-CM | POA: Diagnosis not present

## 2018-01-27 DIAGNOSIS — J9611 Chronic respiratory failure with hypoxia: Secondary | ICD-10-CM | POA: Diagnosis not present

## 2018-01-27 DIAGNOSIS — E441 Mild protein-calorie malnutrition: Secondary | ICD-10-CM | POA: Diagnosis not present

## 2018-01-27 DIAGNOSIS — R1313 Dysphagia, pharyngeal phase: Secondary | ICD-10-CM | POA: Diagnosis not present

## 2018-01-27 DIAGNOSIS — M810 Age-related osteoporosis without current pathological fracture: Secondary | ICD-10-CM | POA: Diagnosis not present

## 2018-01-28 DIAGNOSIS — G4733 Obstructive sleep apnea (adult) (pediatric): Secondary | ICD-10-CM | POA: Diagnosis not present

## 2018-01-28 DIAGNOSIS — J449 Chronic obstructive pulmonary disease, unspecified: Secondary | ICD-10-CM | POA: Diagnosis not present

## 2018-01-28 DIAGNOSIS — E785 Hyperlipidemia, unspecified: Secondary | ICD-10-CM | POA: Diagnosis not present

## 2018-01-28 DIAGNOSIS — G43009 Migraine without aura, not intractable, without status migrainosus: Secondary | ICD-10-CM | POA: Diagnosis not present

## 2018-01-28 DIAGNOSIS — J9611 Chronic respiratory failure with hypoxia: Secondary | ICD-10-CM | POA: Diagnosis not present

## 2018-01-28 DIAGNOSIS — K7290 Hepatic failure, unspecified without coma: Secondary | ICD-10-CM | POA: Diagnosis not present

## 2018-01-28 DIAGNOSIS — R590 Localized enlarged lymph nodes: Secondary | ICD-10-CM | POA: Diagnosis not present

## 2018-01-28 DIAGNOSIS — R1313 Dysphagia, pharyngeal phase: Secondary | ICD-10-CM | POA: Diagnosis not present

## 2018-01-28 DIAGNOSIS — E441 Mild protein-calorie malnutrition: Secondary | ICD-10-CM | POA: Diagnosis not present

## 2018-01-28 DIAGNOSIS — M810 Age-related osteoporosis without current pathological fracture: Secondary | ICD-10-CM | POA: Diagnosis not present

## 2018-01-28 DIAGNOSIS — I10 Essential (primary) hypertension: Secondary | ICD-10-CM | POA: Diagnosis not present

## 2018-01-29 DIAGNOSIS — R590 Localized enlarged lymph nodes: Secondary | ICD-10-CM | POA: Diagnosis not present

## 2018-01-29 DIAGNOSIS — E441 Mild protein-calorie malnutrition: Secondary | ICD-10-CM | POA: Diagnosis not present

## 2018-01-29 DIAGNOSIS — J449 Chronic obstructive pulmonary disease, unspecified: Secondary | ICD-10-CM | POA: Diagnosis not present

## 2018-01-29 DIAGNOSIS — E785 Hyperlipidemia, unspecified: Secondary | ICD-10-CM | POA: Diagnosis not present

## 2018-01-29 DIAGNOSIS — R1313 Dysphagia, pharyngeal phase: Secondary | ICD-10-CM | POA: Diagnosis not present

## 2018-01-29 DIAGNOSIS — M810 Age-related osteoporosis without current pathological fracture: Secondary | ICD-10-CM | POA: Diagnosis not present

## 2018-01-29 DIAGNOSIS — G4733 Obstructive sleep apnea (adult) (pediatric): Secondary | ICD-10-CM | POA: Diagnosis not present

## 2018-01-29 DIAGNOSIS — I10 Essential (primary) hypertension: Secondary | ICD-10-CM | POA: Diagnosis not present

## 2018-01-29 DIAGNOSIS — K7290 Hepatic failure, unspecified without coma: Secondary | ICD-10-CM | POA: Diagnosis not present

## 2018-01-29 DIAGNOSIS — G43009 Migraine without aura, not intractable, without status migrainosus: Secondary | ICD-10-CM | POA: Diagnosis not present

## 2018-01-29 DIAGNOSIS — J9611 Chronic respiratory failure with hypoxia: Secondary | ICD-10-CM | POA: Diagnosis not present

## 2018-01-30 DIAGNOSIS — E441 Mild protein-calorie malnutrition: Secondary | ICD-10-CM | POA: Diagnosis not present

## 2018-01-30 DIAGNOSIS — R1313 Dysphagia, pharyngeal phase: Secondary | ICD-10-CM | POA: Diagnosis not present

## 2018-01-30 DIAGNOSIS — G43009 Migraine without aura, not intractable, without status migrainosus: Secondary | ICD-10-CM | POA: Diagnosis not present

## 2018-01-30 DIAGNOSIS — I10 Essential (primary) hypertension: Secondary | ICD-10-CM | POA: Diagnosis not present

## 2018-01-30 DIAGNOSIS — E785 Hyperlipidemia, unspecified: Secondary | ICD-10-CM | POA: Diagnosis not present

## 2018-01-30 DIAGNOSIS — M810 Age-related osteoporosis without current pathological fracture: Secondary | ICD-10-CM | POA: Diagnosis not present

## 2018-01-30 DIAGNOSIS — R590 Localized enlarged lymph nodes: Secondary | ICD-10-CM | POA: Diagnosis not present

## 2018-01-30 DIAGNOSIS — K7290 Hepatic failure, unspecified without coma: Secondary | ICD-10-CM | POA: Diagnosis not present

## 2018-01-30 DIAGNOSIS — J9611 Chronic respiratory failure with hypoxia: Secondary | ICD-10-CM | POA: Diagnosis not present

## 2018-01-30 DIAGNOSIS — G4733 Obstructive sleep apnea (adult) (pediatric): Secondary | ICD-10-CM | POA: Diagnosis not present

## 2018-01-30 DIAGNOSIS — J449 Chronic obstructive pulmonary disease, unspecified: Secondary | ICD-10-CM | POA: Diagnosis not present

## 2018-01-31 ENCOUNTER — Telehealth: Payer: Self-pay | Admitting: Internal Medicine

## 2018-01-31 NOTE — Telephone Encounter (Signed)
[name] [phone] [place]; needs verbal orders

## 2018-01-31 NOTE — Telephone Encounter (Signed)
Speech [facility] VO given visits to be placed on hold until barium swallow is completed. Do you agree?

## 2018-01-31 NOTE — Telephone Encounter (Signed)
That is fine.  Thanks, [employer]

## 2018-02-03 DIAGNOSIS — G43009 Migraine without aura, not intractable, without status migrainosus: Secondary | ICD-10-CM | POA: Diagnosis not present

## 2018-02-03 DIAGNOSIS — J449 Chronic obstructive pulmonary disease, unspecified: Secondary | ICD-10-CM | POA: Diagnosis not present

## 2018-02-03 DIAGNOSIS — E441 Mild protein-calorie malnutrition: Secondary | ICD-10-CM | POA: Diagnosis not present

## 2018-02-03 DIAGNOSIS — G4733 Obstructive sleep apnea (adult) (pediatric): Secondary | ICD-10-CM | POA: Diagnosis not present

## 2018-02-03 DIAGNOSIS — J9611 Chronic respiratory failure with hypoxia: Secondary | ICD-10-CM | POA: Diagnosis not present

## 2018-02-03 DIAGNOSIS — K7290 Hepatic failure, unspecified without coma: Secondary | ICD-10-CM | POA: Diagnosis not present

## 2018-02-03 DIAGNOSIS — R1313 Dysphagia, pharyngeal phase: Secondary | ICD-10-CM | POA: Diagnosis not present

## 2018-02-03 DIAGNOSIS — R590 Localized enlarged lymph nodes: Secondary | ICD-10-CM | POA: Diagnosis not present

## 2018-02-03 DIAGNOSIS — I10 Essential (primary) hypertension: Secondary | ICD-10-CM | POA: Diagnosis not present

## 2018-02-03 DIAGNOSIS — E785 Hyperlipidemia, unspecified: Secondary | ICD-10-CM | POA: Diagnosis not present

## 2018-02-03 DIAGNOSIS — M810 Age-related osteoporosis without current pathological fracture: Secondary | ICD-10-CM | POA: Diagnosis not present

## 2018-02-04 ENCOUNTER — Ambulatory Visit (INDEPENDENT_AMBULATORY_CARE_PROVIDER_SITE_OTHER): Payer: Medicare Other | Admitting: Internal Medicine

## 2018-02-04 ENCOUNTER — Encounter: Payer: Self-pay | Admitting: Internal Medicine

## 2018-02-04 ENCOUNTER — Other Ambulatory Visit (INDEPENDENT_AMBULATORY_CARE_PROVIDER_SITE_OTHER): Payer: Medicare Other

## 2018-02-04 ENCOUNTER — Telehealth: Payer: Self-pay | Admitting: Internal Medicine

## 2018-02-04 VITALS — BP 108/60 | HR 101 | Ht 67.0 in | Wt 146.0 lb

## 2018-02-04 DIAGNOSIS — Z8739 Personal history of other diseases of the musculoskeletal system and connective tissue: Secondary | ICD-10-CM

## 2018-02-04 DIAGNOSIS — J849 Interstitial pulmonary disease, unspecified: Secondary | ICD-10-CM

## 2018-02-04 DIAGNOSIS — J9611 Chronic respiratory failure with hypoxia: Secondary | ICD-10-CM | POA: Diagnosis not present

## 2018-02-04 DIAGNOSIS — A318 Other mycobacterial infections: Secondary | ICD-10-CM

## 2018-02-04 DIAGNOSIS — A319 Mycobacterial infection, unspecified: Secondary | ICD-10-CM | POA: Diagnosis not present

## 2018-02-04 DIAGNOSIS — Z77120 Contact with and (suspected) exposure to mold (toxic): Secondary | ICD-10-CM | POA: Diagnosis not present

## 2018-02-04 DIAGNOSIS — J439 Emphysema, unspecified: Secondary | ICD-10-CM

## 2018-02-04 DIAGNOSIS — R0602 Shortness of breath: Secondary | ICD-10-CM | POA: Diagnosis not present

## 2018-02-04 LAB — SEDIMENTATION RATE: Sed Rate: 69 mm/hr — ABNORMAL HIGH (ref 0–20)

## 2018-02-04 NOTE — Progress Notes (Signed)
Subjective:    Patient ID: [name], male    DOB: [birth year], 72 y.o.   MRN: [id number]  PCP Lorella Nimrod, MD   HPI  OV 02/04/2018  Chief Complaint  Patient presents with  . Consult    Referred by Dr. Reesa Chew due to possible ILD.  Pt has had a cough since he became sick in January 2019. Also has c/o SOB, pain in his left side. DME: [facility], 2L O2.  hx from patient, chart review and ILD questionnaire   72 year old male known at baseline to have COPD NOS (even as of 2012 but never on o2 till Jan 2019) . Referred to the interstitial lung disease clinic.  It is a very complex history since January 2019 when his problem started.  I had a very difficult time piecing together all parts of his history because of multiple admissions in January 2019 and again April 2019 and a transfer to [facility] in January 2019.    But as best as I can get the patient tells me that all his problems started only in January 2019.  He was admitted Oct 14 2017 with abd pain due to cholangitis . 3 days later Oct 17, 2017 when he was consulted for acute hypoxemic respiratory failure with pulmonary infiltrates that was initially considered aspiration/ARDS v cholangitis sepsis ALI. Followup Pulm consult considered Chronic  HP based on CT chest findings.  He did have liver injury and jaundice and high fevers. However, patient tells me that he never aspirated (though at [facility] later in Jan 2019 he had failed swallow).  Discharged Oct 22, 2017 to [facility].   It seems at that point he had significant jaundice and the [facility] notes indicate he had sepsis secondary to ascending cholangitis and hyperbilirubinemia.  When he was transferred to [facility] in January 2019 he was on 14 L nasal cannula.  He was intubated for ease ERCP.  At some point it is been determined that he needs a cholecystectomy which he is yet to have.  Also  Jan 2019 sputum culture was positive for AFB which is negative for MTB and and  negative for MAI at time of discharge from [facility] and as of [place] note 11/11/17. Currently shows he  Grew Mycobacterium abseccuss - rapid grower . Key evaluation at [facility] in Jan 2019  is indicated below.  In particular with terms of autoimmune profile he did have a positive P-ANCA level at [facility] in January 2019.  However this might have been negative at [facility].  His sedimentation rate was extremely high at 94, IgE 678 and  never been on prednisone to treat this according to his history.    At the time of discharge from [facility] in January 2019/February 2019 [apparently spent 1-1/2 months there] he was discharged on 3-1/2 L at rest with 7 L exertion.  Of note he is also been reported to have emphysema on the CT chest  At this point in time he continues to do better.  He is between 2 and 3 L of oxygen with rest and exertion.  Early April 2019 he had another admission at [facility] for atypical chest pain.  CT angiogram ruled out PE.  But I personally visualized the scan it shows bilateral upper lobe diffuse groundglass opacities.  The official report is that it is somewhat better compared to January 2019.  There is upper lobe predominance to this.  Review of [facility] [place] ILD questionnaire  Symptoms: He has had cough since January 2019 associated with sputum production.  It is roughly the same since onset.Marland Kitchen  He also has dyspnea since the last 5 months.  He believes it slowly getting worse but at the same time he is on less oxygen and he also says he is getting better.  In terms of severity he is a level 5 out of 5 while doing laundry and a level 4 out of 5 while shopping on making the bed with oxygen in a level 3 work for showering or shaving.  On a level 3 walking on a level at his own pace.  His shortness of breath limits him significantly  Review of systems: He reports fatigue arthralgia, dysphagia, weight loss for the last few to  several months, some snoring but this is been going on for decades  Past medical history: For COPD positive he says that he has had rheumatoid arthritis since 2001 but denies other autoimmune diseases.  He also has sleep apnea for the last few to several decades  Family history of lung disease: Essentially negative for pulmonary fibrosis COPD asthma sarcoid cystic fibrosis hypersensitivity pneumonitis or autoimmune disease  Exposure history: He smoked from 1959 till 2019.  1 pack a day.  He quit in January 2019 after he got ill.  He is lived in the same house.  The house is 72 years old.  He is lived there for 5 months.  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  He was in the greenhouse all the time.  There was significant amount of soil and mold and damp woodchips and tobacco leaves and mulch which was moldy.  Therefore has been exposed to mold he plans and also the damp water system in the greenhouse is been exposed to sawdust and multigrain.  He is also mixed herbicides  Pulmonary toxicity history: He denies   January 2019 labs Hepatitis viral panel: Negative HIV: Negative CMV: Negative EBV: Indicative of past infection Legionella: Negative Blood and sputum cultures: No growth to date Sputum AFB: mycobacterim abscessus Sputum cytology: Negative Respiratory viral panel: Negative GI pathogen panel: Positive only for EPEC Aspergillus and fungal markers:negativ x 3 ANA: Negative ANCA: p-anca positve 1:160 Anti-mitochondrial antibody: Positive at 29.1 Anti-smooth muscle antibody: Pending IgE: Elevated at 678 Porphobilinogen: Pending ACE level: Normal Inflammatory markers: Ferritin 1100, CRP 26, pro calcitonin 0.84 Results for [name], [name] (MRN [id number]) as of 02/04/2018 15:08  Ref. Range 10/18/2017 02:07 10/19/2017 09:07 10/20/2017 03:10 10/21/2017 14:11  12/30/2017 15:00  Anit Nuclear Antibody(ANA) Latest Ref Range: Negative    Negative    Angiotensin-Converting Enzyme Latest Ref Range: 14 - 82 U/L 49      Mitochondrial Ab Latest Ref Range: 0.0 - 20.0 Units     21.3 (H)  Mitochondrial M2 Ab, IgG Latest Ref Range: 0.0 - 20.0 Units  29.1 (H)     Cytoplasmic (C-ANCA) Latest Ref Range: Neg:<1:20 titer    <1:20   P-ANCA Latest Ref Range: Neg:<1:20 titer    1:160 (H)   Atypical P-ANCA titer Latest Ref Range: Neg:<1:20 titer    <1:20     PFT 01/07/18   Results for [name], [name] (MRN [id number]) as of 02/04/2018 15:08  Ref. Range 01/07/2018 10:46  FVC-Pre Latest Units: L 2.39  FVC-%Pred-Pre Latest Units: %  61  FEV1-Pre Latest Units: L 1.58  FEV1-%Pred-Pre Latest Units: % 56  Pre FEV1/FVC ratio Latest Units: % 66  Results for [name], [name] (MRN [id number]) as of 02/04/2018 15:08  Ref. Range 01/07/2018 10:46  DLCO unc Latest Units: ml/min/mmHg 5.98  DLCO unc % pred Latest Units: % 21   CT chest January 07, 2018 that I personally visualized and agree with the findings  IMPRESSION: 1. No evidence of pulmonary embolism. 2. Patchy consolidation throughout both lower lobes, largely new since most recent chest CT of 10/17/2017. Mild patchy consolidation in the dependent upper lobes is mildly increased. Findings are most worrisome for multilobar pneumonia/aspiration. 3. Underlying patchy ground-glass opacities in both lungs, decreased from prior chest CT. Patchy reticulation in both lungs, nonspecific, difficult to exclude underlying interstitial lung disease. Follow-up high-resolution chest CT may be obtained in 3-6 months as clinically warranted. 4. Mild right paratracheal lymphadenopathy is new and nonspecific, more likely reactive. 5. Moderate emphysema with mild diffuse bronchial wall thickening, suggesting COPD.  Emphysema (ICD10-J43.9).   Electronically Signed   By: Ilona Sorrel M.D.   On: 01/07/2018 18:49   has a past medical  history of Anxiety and depression, COPD (chronic obstructive pulmonary disease) ([place]), GERD (gastroesophageal reflux disease), HTN (hypertension), Hyperlipemia, Migraine, Personal history of alcoholism ([place]), Tobacco use, and Tubular adenoma (01/29/2008).   reports that he quit smoking about 3 months ago. His smoking use included cigarettes. He has a 27.50 pack-year smoking history. He has never used smokeless tobacco.  Past Surgical History:  Procedure Laterality Date  . ANTERIOR FUSION CERVICAL SPINE  2005   two surgeries after two mechanical falls, per Dr. Donald Pore  . COLONOSCOPY W/ BIOPSIES  01/29/2008   Dr. Silvano Rusk  . ESOPHAGOGASTRODUODENOSCOPY N/A 10/15/2017   Procedure: ESOPHAGOGASTRODUODENOSCOPY (EGD);  Surgeon: Irene Shipper, MD;  Location: [facility] ENDOSCOPY;  Service: Endoscopy;  Laterality: N/A;  . [place]   correction for crossed-eyes  . HEMORRHOID SURGERY  1971    No Known Allergies  Immunization History  Administered Date(s) Administered  . Influenza, Seasonal, Injecte, Preservative Fre 07/22/2012  . Influenza,inj,Quad PF,6+ Mos 08/23/2014, 07/20/2016, 06/27/2017  . Influenza-Unspecified 08/06/2017  . Pneumococcal Conjugate-13 07/12/2017  . Pneumococcal Polysaccharide-23 12/02/2012  . Tdap 06/23/2011, 04/20/2016    Family History  Problem Relation Age of Onset  . Heart attack Mother   . Heart attack Father   . Stroke Sister   . Heart attack Brother   . Heart attack Sister      Current Outpatient Medications:  .  acetaminophen (TYLENOL) 325 MG tablet, Take 2 tablets (650 mg total) by mouth every 6 (six) hours as needed for mild pain or moderate pain., Disp: 60 tablet, Rfl: 0 .  albuterol (PROAIR HFA) 108 (90 Base) MCG/ACT inhaler, INHALE 2 PUFFS INTO THE LUNGS FOUR TIMES DAILY, Disp: 8.5 g, Rfl: 6 .  alendronate (FOSAMAX) 70 MG tablet, Take 1 tablet (70 mg total) by mouth every 7 (seven) days. Take with a full glass of water on an empty stomach. (Patient  taking differently: Take 70 mg by mouth every Friday. Take with a full glass of water on an empty stomach.), Disp: 4 tablet, Rfl: 11 .  aspirin EC 81 MG tablet, Take 1 tablet (81 mg total) by mouth daily., Disp: 150 tablet, Rfl: 2 .  atorvastatin (LIPITOR) 20 MG tablet, Take 1 tablet (20 mg total) by mouth daily. (Patient taking differently: Take 20 mg by mouth at bedtime. ), Disp: 30  tablet, Rfl: 3 .  doxazosin (CARDURA) 1 MG tablet, Take 1 tablet (1 mg total) by mouth at bedtime., Disp: 30 tablet, Rfl: 2 .  furosemide (LASIX) 20 MG tablet, Take 20 mg by mouth daily., Disp: , Rfl:  .  gabapentin (NEURONTIN) 300 MG capsule, Take 1 capsule (300 mg total) by mouth 2 (two) times daily., Disp: 60 capsule, Rfl: 3 .  HYDROcodone-acetaminophen (NORCO/VICODIN) 5-325 MG tablet, Take 1 tablet by mouth every 6 (six) hours as needed for moderate pain., Disp: , Rfl:  .  mirtazapine (REMERON) 15 MG tablet, Take 1 tablet (15 mg total) by mouth at bedtime., Disp: 30 tablet, Rfl: 2 .  Multiple Vitamins-Minerals (CENTRUM SILVER ADULT 50+) TABS, Take 1 tablet by mouth daily., Disp: , Rfl:  .  OXYGEN, Inhale 3-7 L/min into the lungs continuous. , Disp: , Rfl:  .  tiotropium (SPIRIVA) 18 MCG inhalation capsule, Place 1 capsule (18 mcg total) into inhaler and inhale daily. (Patient taking differently: Place 18 mcg into inhaler and inhale at bedtime. ), Disp: 30 capsule, Rfl: 2 .  Zinc Oxide (DESITIN) 40 % PSTE, Apply 1 application topically See admin instructions. Apply 1 application to sacral area daily after bath, Disp: , Rfl:  .  nitroGLYCERIN (NITROSTAT) 0.4 MG SL tablet, Place 1 tablet (0.4 mg total) under the tongue every 5 (five) minutes as needed for chest pain. x3 doses (Patient not taking: Reported on 02/04/2018), Disp: 10 tablet, Rfl: 2 .  ondansetron (ZOFRAN) 4 MG tablet, Take 4 mg by mouth every 8 (eight) hours as needed for nausea or vomiting., Disp: , Rfl:  .  polyethylene glycol (MIRALAX / GLYCOLAX) packet,  Take 17 g by mouth daily as needed for mild constipation. , Disp: , Rfl:    Review of Systems  Constitutional: Positive for unexpected weight change. Negative for fever.  HENT: Positive for congestion and trouble swallowing. Negative for dental problem, ear pain, nosebleeds, postnasal drip, rhinorrhea, sinus pressure, sneezing and sore throat.   Eyes: Negative for redness and itching.  Respiratory: Positive for cough, chest tightness and shortness of breath. Negative for wheezing.   Cardiovascular: Positive for palpitations and leg swelling.  Gastrointestinal: Negative for nausea and vomiting.  Genitourinary: Negative for dysuria.  Musculoskeletal: Negative for joint swelling.  Skin: Negative for rash.  Allergic/Immunologic: Negative.  Negative for environmental allergies, food allergies and immunocompromised state.  Neurological: Positive for headaches.  Hematological: Bruises/bleeds easily.  Psychiatric/Behavioral: Negative for dysphoric mood. The patient is not nervous/anxious.        Objective:   Physical Exam  Vitals:   02/04/18 1456  BP: 108/60  Pulse: (!) 101  SpO2: 98%  Weight: 146 lb (66.2 kg)  Height: '5\' 7"'$  (1.702 m)    Estimated body mass index is 22.87 kg/m as calculated from the following:   Height as of this encounter: '5\' 7"'$  (1.702 m).   Weight as of this encounter: 146 lb (66.2 kg).   General Appearance:    Looks frail but stable  Head:    Normocephalic, without obvious abnormality, atraumatic  Eyes:    PERRL - yes, conjunctiva/corneas - clear      Ears:    Normal external ear canals, both ears  Nose:   NG tube - no but has [place]  Throat:  ETT TUBE - no , OG tube - no  Neck:   Supple,  No enlargement/tenderness/nodules     Lungs:     Clear to auscultation bilaterally,   Chest wall:  No deformity  Heart:    S1 and S2 normal, no murmur, CVP - no.  Pressors - no  Abdomen:     Soft, no masses, no organomegaly  Genitalia:    Not done  Rectal:   not done    Extremities:   Extremities- intact. No edema     Skin:   Intact in exposed areas .     Neurologic:   Sedation - none -> RASS - na . Moves all 4s - yes. CAM-ICU - neg . Orientation - x 3 +         Assessment & Plan:     ICD-10-CM   1. Chronic hypoxemic respiratory failure (HCC) J96.11 Sed Rate (ESR)    Angiotensin converting enzyme    Antinuclear Antib (ANA)    Anti-DNA antibody, double-stranded    Rheumatoid Factor    Cyclic citrul peptide antibody, IgG    Sjogren's syndrome antibods(ssa + ssb)    Anti-scleroderma antibody    ANCA Screen Reflex Titer    Mpo/pr-3 (anca) antibodies    CK Total (and CKMB)    RNP Antibodies    Aldolase    Hypersensitivity pnuemonitis profile    CT Chest High Resolution    CANCELED: CT Chest High Resolution  2. Pulmonary emphysema, unspecified emphysema type ([place]) J43.9   3. ILD (interstitial lung disease) ([place]) J84.9   4. Mold exposure Z77.120   5. 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 Ambulatory referral to Infectious Disease  6. Shortness of breath R06.02   7. History of rheumatoid arthritis Z87.39     Overall he is slightly better since January 2019 in terms of his oxygen requirement but the etiology remains unknown. I do not think this is straightforward pneumonia.  Possibilities in this gentleman include  1) chronic fibrotic hypersensitivity pneumonitis that flared up.  Given his mold exposure history this is possible  2) post acute lung injury/Boop following his sepsis admission in January 2019 that is slow to resolve .  The high sedimentation rate would fit him with this  3) rheumatoid arthritis associated Boop although this is unlikely given the fact his autoimmune profile was negative although rheumatoid factor was not checked in January 2019 and the story seems more consistent with acute lung injury/Boop  4) CT is not consistent with UIP/IPF  5) pulmonary vasculitis - ??  Was the P-ANCA titers false  positive in January 2019  6) most worrisome is that he has pulmonary infection from rapid growing mycobacteria Mycobacterium abscess January 2019  Plan -Check autoimmune, vasculitis profile and sedimentation rate again -Check high-resolution CT scan of the chest to get better definition of the lung pulmonary parenchymal pathology with definitive read from thoracic radiology to help narrow the differential diagnosis -Needs referral to infectious disease clinic given Mycobacterium abscess culture positive -We will see him back for follow-up in the next few weeks  I discussed this with him and he is agreeable with the plan    > 50% of this > 40 min visit spent in face to face counseling or/and coordination of care    Dr. Brand Males, M.D., [facility].C.P Pulmonary and Critical Care Medicine Staff Physician, [place] Director - Interstitial Lung Disease  Program  Pulmonary [place] at [place], [place], [address]  Pager: [phone], If no answer or between  15:00h - 7:00h: call [phone] Telephone: [phone]

## 2018-02-04 NOTE — Telephone Encounter (Signed)
Hi John (cc PCP Lorella Nimrod, MD)_  This [name] patient you saw in Jan 2019 as ID consult. He is sputum positive for RGM - M. Absecuss. I am wondring if pulmonary infiltrate is due to this and resp failure. Made ID clinic referral. Please have someone eevaluate  Thanks  Dr. Brand Males, M.D., [facility].C.P Pulmonary and Critical Care Medicine Staff Physician, [place] Director - Interstitial Lung Disease  Program  Pulmonary [place] at [place], [place], [address]  Pager: [phone], If no answer or between  15:00h - 7:00h: call [phone] Telephone: [phone]

## 2018-02-04 NOTE — Patient Instructions (Addendum)
ICD-10-CM   1. Chronic hypoxemic respiratory failure (HCC) J96.11   2. Pulmonary emphysema, unspecified emphysema type ([place]) J43.9   3. ILD (interstitial lung disease) ([place]) J84.9   4. Mold exposure Z77.120   5. 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

## 2018-02-05 ENCOUNTER — Other Ambulatory Visit: Payer: Self-pay

## 2018-02-05 ENCOUNTER — Emergency Department (HOSPITAL_COMMUNITY): Payer: Medicare Other

## 2018-02-05 ENCOUNTER — Emergency Department (HOSPITAL_COMMUNITY)
Admission: EM | Admit: 2018-02-05 | Discharge: 2018-02-05 | Disposition: A | Discharge status: Home / Self Care | Payer: Medicare Other | Attending: Emergency Medicine | Admitting: Emergency Medicine

## 2018-02-05 ENCOUNTER — Encounter (HOSPITAL_COMMUNITY): Payer: Self-pay

## 2018-02-05 DIAGNOSIS — R059 Cough, unspecified: Secondary | ICD-10-CM

## 2018-02-05 DIAGNOSIS — R509 Fever, unspecified: Secondary | ICD-10-CM | POA: Diagnosis not present

## 2018-02-05 DIAGNOSIS — F329 Major depressive disorder, single episode, unspecified: Secondary | ICD-10-CM | POA: Diagnosis not present

## 2018-02-05 DIAGNOSIS — Z79899 Other long term (current) drug therapy: Secondary | ICD-10-CM | POA: Insufficient documentation

## 2018-02-05 DIAGNOSIS — R5081 Fever presenting with conditions classified elsewhere: Secondary | ICD-10-CM

## 2018-02-05 DIAGNOSIS — Z7982 Long term (current) use of aspirin: Secondary | ICD-10-CM | POA: Diagnosis not present

## 2018-02-05 DIAGNOSIS — F419 Anxiety disorder, unspecified: Secondary | ICD-10-CM | POA: Insufficient documentation

## 2018-02-05 DIAGNOSIS — R079 Chest pain, unspecified: Secondary | ICD-10-CM | POA: Diagnosis not present

## 2018-02-05 DIAGNOSIS — Z87891 Personal history of nicotine dependence: Secondary | ICD-10-CM | POA: Diagnosis not present

## 2018-02-05 DIAGNOSIS — I10 Essential (primary) hypertension: Secondary | ICD-10-CM | POA: Insufficient documentation

## 2018-02-05 DIAGNOSIS — J849 Interstitial pulmonary disease, unspecified: Secondary | ICD-10-CM | POA: Diagnosis not present

## 2018-02-05 DIAGNOSIS — J449 Chronic obstructive pulmonary disease, unspecified: Secondary | ICD-10-CM | POA: Diagnosis not present

## 2018-02-05 DIAGNOSIS — R05 Cough: Secondary | ICD-10-CM | POA: Diagnosis not present

## 2018-02-05 DIAGNOSIS — R0789 Other chest pain: Secondary | ICD-10-CM | POA: Diagnosis not present

## 2018-02-05 LAB — CBC
HEMATOCRIT: 39.3 % (ref 39.0–52.0)
HEMOGLOBIN: 12.9 g/dL — AB (ref 13.0–17.0)
MCH: 29.3 pg (ref 26.0–34.0)
MCHC: 32.8 g/dL (ref 30.0–36.0)
MCV: 89.1 fL (ref 78.0–100.0)
Platelets: 248 10*3/uL (ref 150–400)
RBC: 4.41 MIL/uL (ref 4.22–5.81)
RDW: 14.6 % (ref 11.5–15.5)
WBC: 11.3 10*3/uL — ABNORMAL HIGH (ref 4.0–10.5)

## 2018-02-05 LAB — LIPASE, BLOOD: Lipase: 22 U/L (ref 11–51)

## 2018-02-05 LAB — COMPREHENSIVE METABOLIC PANEL
ALBUMIN: 3.1 g/dL — AB (ref 3.5–5.0)
ALK PHOS: 80 U/L (ref 38–126)
ALT: 8 U/L — ABNORMAL LOW (ref 17–63)
ANION GAP: 9 (ref 5–15)
AST: 17 U/L (ref 15–41)
BUN: 6 mg/dL (ref 6–20)
CALCIUM: 8.9 mg/dL (ref 8.9–10.3)
CHLORIDE: 102 mmol/L (ref 101–111)
CO2: 26 mmol/L (ref 22–32)
Creatinine, Ser: 0.8 mg/dL (ref 0.61–1.24)
GFR calc non Af Amer: >60 mL/min (ref >60)
GLUCOSE: 104 mg/dL — AB (ref 65–99)
POTASSIUM: 3.3 mmol/L — AB (ref 3.5–5.1)
SODIUM: 137 mmol/L (ref 135–145)
Total Bilirubin: 0.9 mg/dL (ref 0.3–1.2)
Total Protein: 7.2 g/dL (ref 6.5–8.1)

## 2018-02-05 LAB — ANTI-DNA ANTIBODY, DOUBLE-STRANDED: ds DNA Ab: 1 IU/mL

## 2018-02-05 LAB — I-STAT TROPONIN, ED: TROPONIN I, POC: 0 ng/mL (ref 0.00–0.08)

## 2018-02-05 LAB — ANCA SCREEN W REFLEX TITER: ANCA Screen: POSITIVE — AB

## 2018-02-05 LAB — MPO/PR-3 (ANCA) ANTIBODIES: Serine Protease 3: <1 AI

## 2018-02-05 LAB — RHEUMATOID FACTOR: Rhuematoid fact SerPl-aCnc: 27 IU/mL — ABNORMAL HIGH (ref <14)

## 2018-02-05 LAB — SJOGREN'S SYNDROME ANTIBODS(SSA + SSB)
SSA (Ro) (ENA) Antibody, IgG: <1 AI
SSB (LA) (ENA) ANTIBODY, IGG: NEGATIVE AI

## 2018-02-05 LAB — CYCLIC CITRUL PEPTIDE ANTIBODY, IGG: Cyclic Citrullin Peptide Ab: <16 UNITS

## 2018-02-05 LAB — ANGIOTENSIN CONVERTING ENZYME: Angiotensin-Converting Enzyme: 55 U/L (ref 9–67)

## 2018-02-05 LAB — I-STAT CG4 LACTIC ACID, ED: LACTIC ACID, VENOUS: 1.27 mmol/L (ref 0.5–1.9)

## 2018-02-05 LAB — ANTI-SCLERODERMA ANTIBODY: SCLERODERMA (SCL-70) (ENA) ANTIBODY, IGG: NEGATIVE AI

## 2018-02-05 NOTE — ED Provider Notes (Signed)
[facility] EMERGENCY DEPARTMENT Provider Note  CSN: [id number] Arrival date & time: 02/05/18 1652  Chief Complaint(s) Chest Pain and Fever  HPI YOUSOF Hubbard is a 72 y.o. male with a history of lung term tobacco use and COPD on 3L [place] home oxygen who presents to the emergency department with left-sided chest pain has been ongoing for approximately 5 to 6 days.  Pain is sharp and aching nature.  Exacerbated with movement and palpation of the left chest.  Also exacerbated with coughing.  No alleviating factors.  Similar to prior presentations associated with persistent coughing.  Patient also endorses associated coughing during that time productive of clear sputum.  Also reports intermittent fevers with a T-max of 102 earlier today which was taken by the home nurse.  Patient denies any headache or nuchal rigidity.  No abdominal pain.  No nausea or vomiting.  No diarrhea.  No urinary symptoms.  No rashes.  HPI  Past Medical History Past Medical History:  Diagnosis Date  . Anxiety and depression   . COPD (chronic obstructive pulmonary disease) ([place])   . GERD (gastroesophageal reflux disease)   . HTN (hypertension)   . Hyperlipemia   . Migraine   . Personal history of alcoholism ([place])   . Tobacco use   . Tubular adenoma 01/29/2008   Patient Active Problem List   Diagnosis Date Noted  . Dysphagia 01/13/2018  . Silent aspiration 01/13/2018  . Cholelithiasis without obstruction 01/13/2018  . Chronic hypoxemic respiratory failure ([place]) 01/08/2018  . Atypical chest pain 01/08/2018  . HCAP (healthcare-associated pneumonia) 01/08/2018  . Right carotid bruit 11/19/2017  . Malnutrition of moderate degree 10/22/2017  . Ground glass opacity present on imaging of lung   . Thrombocytopenia ([place]) 10/16/2017  . Gastroparesis   . Elevated liver function tests   . Osteoporosis 06/27/2017  . OSA (obstructive sleep apnea) 08/23/2014  . Hx of migraines 05/26/2014  . Back pain,  chronic 05/26/2014  . History of colonic polyps 12/29/2012  . Hyperlipemia 11/12/2012  . COPD (chronic obstructive pulmonary disease) ([place]) 11/12/2012  . Leg swelling 11/12/2012  . HTN (hypertension) 11/12/2012  . Tobacco use 11/12/2012   Home Medication(s) Prior to Admission medications   Medication Sig Start Date End Date Taking? Authorizing Provider  acetaminophen (TYLENOL) 325 MG tablet Take 2 tablets (650 mg total) by mouth every 6 (six) hours as needed for mild pain or moderate pain. 06/25/17  Yes Waynetta Pean, PA-C  albuterol (PROAIR HFA) 108 (90 Base) MCG/ACT inhaler INHALE 2 PUFFS INTO THE LUNGS FOUR TIMES DAILY 12/30/17  Yes Lorella Nimrod, MD  alendronate (FOSAMAX) 70 MG tablet Take 1 tablet (70 mg total) by mouth every 7 (seven) days. Take with a full glass of water on an empty stomach. Patient taking differently: Take 70 mg by mouth every Friday. Take with a full glass of water on an empty stomach. 06/27/17 06/27/18 Yes Alphonzo Grieve, MD  aspirin EC 81 MG tablet Take 1 tablet (81 mg total) by mouth daily. 12/30/17 12/30/18 Yes Lorella Nimrod, MD  atorvastatin (LIPITOR) 20 MG tablet Take 1 tablet (20 mg total) by mouth daily. Patient taking differently: Take 20 mg by mouth at bedtime.  12/30/17  Yes Lorella Nimrod, MD  doxazosin (CARDURA) 1 MG tablet Take 1 tablet (1 mg total) by mouth at bedtime. 12/30/17  Yes Lorella Nimrod, MD  furosemide (LASIX) 20 MG tablet Take 20 mg by mouth daily.   Yes [provider]  gabapentin (NEURONTIN) 300  MG capsule Take 1 capsule (300 mg total) by mouth 2 (two) times daily. 12/30/17 12/30/18 Yes Lorella Nimrod, MD  HYDROcodone-acetaminophen (NORCO/VICODIN) 5-325 MG tablet Take 1 tablet by mouth 2 (two) times daily as needed (for pain).   Yes [provider]  mirtazapine (REMERON) 15 MG tablet Take 1 tablet (15 mg total) by mouth at bedtime. 12/30/17  Yes Lorella Nimrod, MD  Multiple Vitamins-Minerals (CENTRUM SILVER ADULT 50+) TABS Take 1  tablet by mouth daily.   Yes [provider]  nitroGLYCERIN (NITROSTAT) 0.4 MG SL tablet Place 1 tablet (0.4 mg total) under the tongue every 5 (five) minutes as needed for chest pain. x3 doses 12/30/17  Yes Lorella Nimrod, MD  OXYGEN Inhale 3-7 L/min into the lungs continuous.    Yes [provider]  polyethylene glycol (MIRALAX / GLYCOLAX) packet Take 17 g by mouth daily as needed for mild constipation.    Yes [provider]  tiotropium (SPIRIVA) 18 MCG inhalation capsule Place 1 capsule (18 mcg total) into inhaler and inhale daily. Patient taking differently: Place 18 mcg into inhaler and inhale at bedtime.  12/30/17  Yes Lorella Nimrod, MD                                                                                                                                    Past Surgical History Past Surgical History:  Procedure Laterality Date  . ANTERIOR FUSION CERVICAL SPINE  2005   two surgeries after two mechanical falls, per Dr. Donald Pore  . COLONOSCOPY W/ BIOPSIES  01/29/2008   Dr. Silvano Rusk  . ESOPHAGOGASTRODUODENOSCOPY N/A 10/15/2017   Procedure: ESOPHAGOGASTRODUODENOSCOPY (EGD);  Surgeon: Irene Shipper, MD;  Location: [facility] ENDOSCOPY;  Service: Endoscopy;  Laterality: N/A;  . [place]   correction for crossed-eyes  . HEMORRHOID SURGERY  1971   Family History Family History  Problem Relation Age of Onset  . Heart attack Mother   . Heart attack Father   . Stroke Sister   . Heart attack Brother   . Heart attack Sister     Social History Social History   Tobacco Use  . Smoking status: Former Smoker    Packs/day: 0.50    Years: 55.00    Pack years: 27.50    Types: Cigarettes    Last attempt to quit: 10/07/2017    Years since quitting: 0.3  . Smokeless tobacco: Never Used  Substance Use Topics  . Alcohol use: Yes    Alcohol/week: 0.0 oz    Comment: Beer-rarely.  . Drug use: No   Allergies Patient has no known allergies.  Review of  Systems Review of Systems All other systems are reviewed and are negative for acute change except as noted in the HPI  Physical Exam Vital Signs  I have reviewed the triage vital signs BP 123/80 (BP Location: Right Arm)   Pulse (!) 106   Temp 97.8  F (36.6 C) (Oral)   Resp 19   Ht 5\' 7"  (1.702 m)   Wt 66.2 kg (146 lb)   SpO2 99%   BMI 22.87 kg/m   Physical Exam  Constitutional: He is oriented to person, place, and time. He appears well-developed and well-nourished. No distress.  HENT:  Head: Normocephalic and atraumatic.  Nose: Nose normal.  Eyes: Pupils are equal, round, and reactive to light. Conjunctivae and EOM are normal. Right eye exhibits no discharge. Left eye exhibits no discharge. No scleral icterus.  Neck: Normal range of motion. Neck supple.  Cardiovascular: Normal rate and regular rhythm. Exam reveals no gallop and no friction rub.  No murmur heard. Pulmonary/Chest: Effort normal and breath sounds normal. No stridor. No respiratory distress. He has no wheezes. He has no rhonchi. He has no rales. He exhibits tenderness.    Abdominal: Soft. He exhibits no distension. There is no tenderness.  Musculoskeletal: He exhibits no edema or tenderness.  Neurological: He is alert and oriented to person, place, and time.  Skin: Skin is warm and dry. No rash noted. He is not diaphoretic. No erythema.  Psychiatric: He has a normal mood and affect.  Vitals reviewed.   ED Results and Treatments Labs (all labs ordered are listed, but only abnormal results are displayed) Labs Reviewed  CBC - Abnormal; Notable for the following components:      Result Value   WBC 11.3 (*)    Hemoglobin 12.9 (*)    All other components within normal limits  COMPREHENSIVE METABOLIC PANEL - Abnormal; Notable for the following components:   Potassium 3.3 (*)    Glucose, Bld 104 (*)    Albumin 3.1 (*)    ALT 8 (*)    All other components within normal limits  LIPASE, BLOOD  I-STAT TROPONIN,  ED  I-STAT CG4 LACTIC ACID, ED                                                                                                                         EKG  EKG Interpretation  Date/Time:  Wednesday Feb 05 2018 18:30:43 EDT Ventricular Rate:  107 PR Interval:    QRS Duration: 86 QT Interval:  332 QTC Calculation: 443 R Axis:     Text Interpretation:  Sinus tachycardia Supraventricular bigeminy NO STEMI Otherwise no significant change Confirmed by Addison Lank [phone]) on 02/05/2018 6:38:42 PM      Radiology Dg Chest 2 View  Result Date: 02/05/2018 CLINICAL DATA:  72 year old male with a history of left-sided chest pain and cough EXAM: CHEST - 2 VIEW COMPARISON:  01/07/2018, CT 01/07/2018 FINDINGS: Cardiomediastinal silhouette is unchanged in size and contour. No evidence of central vascular congestion. Diffuse reticular opacities through the bilateral lungs. Improved aeration at the lung bases compared to the prior. No pneumothorax. No pleural effusion. No new confluent airspace disease. No displaced fracture.  Degenerative changes of the spine. IMPRESSION: Persisting reticular pattern of opacity through the bilateral lungs, mildly  improved from the prior. Differential includes developing chronic fibrosis, or persisting/recurrent infection superimposed on chronic changes. Electronically Signed   By: Corrie Mckusick D.O.   On: 02/05/2018 18:26   Ct Chest Wo Contrast  Result Date: 02/05/2018 CLINICAL DATA:  LEFT chest pain radiating to abdomen for 1 week. Cough. Evaluate pneumonia. History of alcoholism, COPD. EXAM: CT CHEST WITHOUT CONTRAST TECHNIQUE: Multidetector CT imaging of the chest was performed following the standard protocol without IV contrast. COMPARISON:  Chest radiograph Feb 05, 2018 and CT chest January 07, 2018 and CT chest October 17, 2017 FINDINGS: CARDIOVASCULAR: Heart and pericardium are unremarkable. Mild coronary artery calcifications. Thoracic aorta is normal course and caliber,  mild calcific atherosclerosis. MEDIASTINUM/NODES: Greater than expected number of non pathologically enlarged lymph nodes are unchanged, attributable to parenchymal process. LUNGS/PLEURA: Tracheobronchial tree is patent, no pneumothorax. Diffuse prominence of the interstitium with bilateral honeycombing predominantly in lung bases and posteriorly. No ground-glass opacities or consolidation. Moderate centrilobular emphysema. Small LEFT pleural effusion. UPPER ABDOMEN: Pneumobilia. MUSCULOSKELETAL: Nonacute. ACDF. Old severe T12 compression fracture. IMPRESSION: 1. Improved aeration of the lungs.  Small LEFT pleural effusion. 2. Worsening interstitial changes most compatible with chronic interstitial lung disease which would be better characterized on high-resolution NONEMERGENT CT chest. 3. Centrilobular emphysema. 4. New pneumobilia, query recent cholecystectomy. Aortic Atherosclerosis (ICD10-I70.0) and Emphysema (ICD10-J43.9). Electronically Signed   By: Elon Alas M.D.   On: 02/05/2018 19:59   Pertinent labs & imaging results that were available during my care of the patient were reviewed by me and considered in my medical decision making (see chart for details).  Medications Ordered in ED Medications - No data to display                                                                                                                                  Procedures Procedures  (including critical care time)  Medical Decision Making / ED Course I have reviewed the nursing notes for this encounter and the patient's prior records (if available in EHR or on provided paperwork).    Chest pain is atypical for ACS.  EKG nonischemic or without evidence of pericarditis.  Chest pain is most consistent for chest wall pain.  Low suspicion for pulmonary embolism.  Not classic for dissection or esophageal perforation.  Chest x-ray revealed persistent reticular pattern in both lungs concerning for chronic  fibrosis versus recurrent infection.  Given the patient's reported fever, CT scan was obtained to better characterize these changes.  It revealed that these changes were more consistent with chronic interstitial lung disease and showed no evidence of pneumonia.  Labs were grossly reassuring, without elevated lactic acid and a negative troponin.  Incentive spirometer was provided to the patient.  Reported fever likely from possible viral etiology as there is no other evidence of infection.  The patient appears reasonably screened and/or stabilized for discharge and I doubt any other medical condition or  other [place] requiring further screening, evaluation, or treatment in the ED at this time prior to discharge.  The patient is safe for discharge with strict return precautions.   Final Clinical Impression(s) / ED Diagnoses Final diagnoses:  Fever in other diseases  Cough  Chest wall pain    Disposition: Discharge  Condition: Good  I have discussed the results, Dx and Tx plan with the patient who expressed understanding and agree(s) with the plan. Discharge instructions discussed at great length. The patient was given strict return precautions who verbalized understanding of the instructions. No further questions at time of discharge.    ED Discharge Orders    None       Follow Up: Primary care provider  Schedule an appointment as soon as possible for a visit  in 3-5 days, If symptoms do not improve or  worsen     This chart was dictated using voice recognition software.  Despite best efforts to proofread,  errors can occur which can change the documentation meaning.   Fatima Blank, MD 02/05/18 2300

## 2018-02-05 NOTE — ED Notes (Signed)
Patient transported to CT 

## 2018-02-05 NOTE — ED Triage Notes (Addendum)
Pt endorses left sided chest pain radiating down to the left abd, cough with clear sputum. Has chronic shob. Seen by home health RN earlier today and had a 102 oral temp, 100.4 here. Pt took tylenol pta. Tachy in triage. Denies n/v/d. Pt was told that his gallbladder needs to come out.

## 2018-02-05 NOTE — ED Notes (Signed)
MD at the bedside  

## 2018-02-05 NOTE — Telephone Encounter (Signed)
We will be happy to reevaluate him.

## 2018-02-06 DIAGNOSIS — K7290 Hepatic failure, unspecified without coma: Secondary | ICD-10-CM | POA: Diagnosis not present

## 2018-02-06 DIAGNOSIS — I10 Essential (primary) hypertension: Secondary | ICD-10-CM | POA: Diagnosis not present

## 2018-02-06 DIAGNOSIS — R590 Localized enlarged lymph nodes: Secondary | ICD-10-CM | POA: Diagnosis not present

## 2018-02-06 DIAGNOSIS — M810 Age-related osteoporosis without current pathological fracture: Secondary | ICD-10-CM | POA: Diagnosis not present

## 2018-02-06 DIAGNOSIS — R1313 Dysphagia, pharyngeal phase: Secondary | ICD-10-CM | POA: Diagnosis not present

## 2018-02-06 DIAGNOSIS — J9611 Chronic respiratory failure with hypoxia: Secondary | ICD-10-CM | POA: Diagnosis not present

## 2018-02-06 DIAGNOSIS — J449 Chronic obstructive pulmonary disease, unspecified: Secondary | ICD-10-CM | POA: Diagnosis not present

## 2018-02-06 DIAGNOSIS — E785 Hyperlipidemia, unspecified: Secondary | ICD-10-CM | POA: Diagnosis not present

## 2018-02-06 DIAGNOSIS — E441 Mild protein-calorie malnutrition: Secondary | ICD-10-CM | POA: Diagnosis not present

## 2018-02-06 DIAGNOSIS — G4733 Obstructive sleep apnea (adult) (pediatric): Secondary | ICD-10-CM | POA: Diagnosis not present

## 2018-02-06 DIAGNOSIS — G43009 Migraine without aura, not intractable, without status migrainosus: Secondary | ICD-10-CM | POA: Diagnosis not present

## 2018-02-06 LAB — CK TOTAL AND CKMB (NOT AT ARMC): CK TOTAL: 23 U/L — AB (ref 44–196)

## 2018-02-06 LAB — ANA: ANA: POSITIVE — AB

## 2018-02-06 LAB — ANTI-NUCLEAR AB-TITER (ANA TITER)

## 2018-02-06 LAB — ALDOLASE: ALDOLASE: 4.5 U/L (ref <=8.1)

## 2018-02-07 ENCOUNTER — Telehealth: Payer: Self-pay | Admitting: Internal Medicine

## 2018-02-07 ENCOUNTER — Other Ambulatory Visit (HOSPITAL_COMMUNITY): Payer: Self-pay | Admitting: Internal Medicine

## 2018-02-07 DIAGNOSIS — R1310 Dysphagia, unspecified: Secondary | ICD-10-CM

## 2018-02-07 DIAGNOSIS — R1319 Other dysphagia: Secondary | ICD-10-CM

## 2018-02-07 NOTE — Telephone Encounter (Signed)
Raquel Sarna  LEt patient now that barring a few scattered antibody positive, autoimmune negative. 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 HE need ID consult and I ordered it as last OV. Do not see an appt. He was seen by dr Megan Salon in Jan 2019 when in hospital. Could you ensure a ppt < 4 weeks please. If not please let me know  Thanks  Dr. Brand Males, M.D., [facility].C.P Pulmonary and Critical Care Medicine Staff Physician, [place] Director - Interstitial Lung Disease  Program  Pulmonary [facility] at [place], [place], [address]  Pager: [phone], If no answer or between  15:00h - 7:00h: call [phone] Telephone: [phone]

## 2018-02-09 LAB — HYPERSENSITIVITY PNUEMONITIS PROFILE
ASPERGILLUS FUMIGATUS: NEGATIVE
FAENIA RETIVIRGULA: NEGATIVE
PIGEON SERUM: NEGATIVE
S. VIRIDIS: NEGATIVE
T. CANDIDUS: NEGATIVE
T. VULGARIS: NEGATIVE

## 2018-02-10 ENCOUNTER — Other Ambulatory Visit: Payer: Self-pay | Admitting: Internal Medicine

## 2018-02-10 DIAGNOSIS — M810 Age-related osteoporosis without current pathological fracture: Secondary | ICD-10-CM | POA: Diagnosis not present

## 2018-02-10 DIAGNOSIS — K7290 Hepatic failure, unspecified without coma: Secondary | ICD-10-CM | POA: Diagnosis not present

## 2018-02-10 DIAGNOSIS — E785 Hyperlipidemia, unspecified: Secondary | ICD-10-CM | POA: Diagnosis not present

## 2018-02-10 DIAGNOSIS — M4856XD Collapsed vertebra, not elsewhere classified, lumbar region, subsequent encounter for fracture with routine healing: Secondary | ICD-10-CM | POA: Diagnosis not present

## 2018-02-10 DIAGNOSIS — G4733 Obstructive sleep apnea (adult) (pediatric): Secondary | ICD-10-CM | POA: Diagnosis not present

## 2018-02-10 DIAGNOSIS — R1313 Dysphagia, pharyngeal phase: Secondary | ICD-10-CM | POA: Diagnosis not present

## 2018-02-10 DIAGNOSIS — J9611 Chronic respiratory failure with hypoxia: Secondary | ICD-10-CM | POA: Diagnosis not present

## 2018-02-10 DIAGNOSIS — R092 Respiratory arrest: Secondary | ICD-10-CM | POA: Diagnosis not present

## 2018-02-10 DIAGNOSIS — R590 Localized enlarged lymph nodes: Secondary | ICD-10-CM | POA: Diagnosis not present

## 2018-02-10 DIAGNOSIS — G43009 Migraine without aura, not intractable, without status migrainosus: Secondary | ICD-10-CM | POA: Diagnosis not present

## 2018-02-10 DIAGNOSIS — J849 Interstitial pulmonary disease, unspecified: Secondary | ICD-10-CM | POA: Diagnosis not present

## 2018-02-10 DIAGNOSIS — E441 Mild protein-calorie malnutrition: Secondary | ICD-10-CM | POA: Diagnosis not present

## 2018-02-10 DIAGNOSIS — I10 Essential (primary) hypertension: Secondary | ICD-10-CM | POA: Diagnosis not present

## 2018-02-10 DIAGNOSIS — J449 Chronic obstructive pulmonary disease, unspecified: Secondary | ICD-10-CM | POA: Diagnosis not present

## 2018-02-11 ENCOUNTER — Ambulatory Visit (HOSPITAL_COMMUNITY)
Admission: RE | Admit: 2018-02-11 | Discharge: 2018-02-11 | Disposition: A | Discharge status: Home / Self Care | Payer: Medicare Other | Source: Ambulatory Visit | Attending: Student in an Organized Health Care Education/Training Program | Admitting: Student in an Organized Health Care Education/Training Program

## 2018-02-11 ENCOUNTER — Ambulatory Visit (HOSPITAL_COMMUNITY)
Admission: RE | Admit: 2018-02-11 | Discharge: 2018-02-11 | Disposition: A | Discharge status: Home / Self Care | Payer: Medicare Other | Source: Ambulatory Visit | Attending: Internal Medicine | Admitting: Internal Medicine

## 2018-02-11 DIAGNOSIS — R1312 Dysphagia, oropharyngeal phase: Secondary | ICD-10-CM | POA: Diagnosis not present

## 2018-02-11 DIAGNOSIS — J80 Acute respiratory distress syndrome: Secondary | ICD-10-CM | POA: Insufficient documentation

## 2018-02-11 DIAGNOSIS — J9601 Acute respiratory failure with hypoxia: Secondary | ICD-10-CM | POA: Diagnosis not present

## 2018-02-11 DIAGNOSIS — R1310 Dysphagia, unspecified: Secondary | ICD-10-CM | POA: Diagnosis not present

## 2018-02-11 DIAGNOSIS — R1319 Other dysphagia: Secondary | ICD-10-CM

## 2018-02-11 DIAGNOSIS — K8309 Other cholangitis: Secondary | ICD-10-CM | POA: Diagnosis not present

## 2018-02-11 NOTE — Progress Notes (Signed)
Modified Barium Swallow Progress Note  Patient Details  Name: [name] MRN: [id number] Date of Birth: April 07, 1946  Today's Date: 02/11/2018  Modified Barium Swallow completed.  Full report located under Chart Review in the Imaging Section.  Brief recommendations include the following:  Clinical Impression  Pt demonstrates swallow function very consistent with prior MBS report. There is a slight delay in laryngeal closure with thin liquids leading to silent penetration and aspiration events. With chin tucked and consecutive rapid swallows taken, epiglottis approximates arytenoids, penetration is minimal, aspirates only coat the bottom of the vocal folds. A cued throat clear ejects this material. When pt was instructed to take one sip at a time no penetration or aspiration occurred on this study due to small bolus size residing in the vallecule as airway fully closes. Overall, pts dysphagia, likely chronic and secondary to COPD or even sensory loss following ACDF, is mild if pt follows strategies. Pt reports 100% compliance with chin tuck since his last visit to [facility] and plans to continue this strategy into the future. SLP provided written instruction to take single sips or clear throat after consecutive sips.    Swallow Evaluation Recommendations       SLP Diet Recommendations: Regular solids;Thin liquid   Liquid Administration via: Straw   Medication Administration: Whole meds with liquid   Supervision: Patient able to self feed   Compensations: Chin tuck;Clear throat intermittently;Use straw to facilitate chin tuck;Slow rate;Small sips/bites   Postural Changes: Remain semi-upright after after feeds/meals (Comment)           Herbie Baltimore, MA CCC-SLP [phone]  Othelia Pulling, Katherene Ponto 02/11/2018,12:27 PM

## 2018-02-12 ENCOUNTER — Other Ambulatory Visit: Payer: Self-pay | Admitting: Internal Medicine

## 2018-02-12 ENCOUNTER — Telehealth: Payer: Self-pay | Admitting: Internal Medicine

## 2018-02-12 DIAGNOSIS — M545 Low back pain, unspecified: Secondary | ICD-10-CM

## 2018-02-12 DIAGNOSIS — R1313 Dysphagia, pharyngeal phase: Secondary | ICD-10-CM | POA: Diagnosis not present

## 2018-02-12 DIAGNOSIS — G8929 Other chronic pain: Secondary | ICD-10-CM

## 2018-02-12 DIAGNOSIS — G43009 Migraine without aura, not intractable, without status migrainosus: Secondary | ICD-10-CM | POA: Diagnosis not present

## 2018-02-12 DIAGNOSIS — G4733 Obstructive sleep apnea (adult) (pediatric): Secondary | ICD-10-CM | POA: Diagnosis not present

## 2018-02-12 DIAGNOSIS — K7290 Hepatic failure, unspecified without coma: Secondary | ICD-10-CM | POA: Diagnosis not present

## 2018-02-12 DIAGNOSIS — M810 Age-related osteoporosis without current pathological fracture: Secondary | ICD-10-CM | POA: Diagnosis not present

## 2018-02-12 DIAGNOSIS — R590 Localized enlarged lymph nodes: Secondary | ICD-10-CM | POA: Diagnosis not present

## 2018-02-12 DIAGNOSIS — E441 Mild protein-calorie malnutrition: Secondary | ICD-10-CM | POA: Diagnosis not present

## 2018-02-12 DIAGNOSIS — J449 Chronic obstructive pulmonary disease, unspecified: Secondary | ICD-10-CM | POA: Diagnosis not present

## 2018-02-12 DIAGNOSIS — E785 Hyperlipidemia, unspecified: Secondary | ICD-10-CM | POA: Diagnosis not present

## 2018-02-12 DIAGNOSIS — I10 Essential (primary) hypertension: Secondary | ICD-10-CM | POA: Diagnosis not present

## 2018-02-12 DIAGNOSIS — J9611 Chronic respiratory failure with hypoxia: Secondary | ICD-10-CM | POA: Diagnosis not present

## 2018-02-12 NOTE — Telephone Encounter (Signed)
Rec'd call from [facility] requesting a DME order for  A Rollator Walker  For safe Ambulating outdoors.  Please Advise if you can place this order for the patient.

## 2018-02-12 NOTE — Telephone Encounter (Signed)
He did place an order for DME walker

## 2018-02-13 ENCOUNTER — Ambulatory Visit (INDEPENDENT_AMBULATORY_CARE_PROVIDER_SITE_OTHER)
Admission: RE | Admit: 2018-02-13 | Discharge: 2018-02-13 | Disposition: A | Discharge status: Home / Self Care | Payer: Medicare Other | Source: Ambulatory Visit | Attending: Internal Medicine | Admitting: Internal Medicine

## 2018-02-13 DIAGNOSIS — J9611 Chronic respiratory failure with hypoxia: Secondary | ICD-10-CM | POA: Diagnosis not present

## 2018-02-13 DIAGNOSIS — J9691 Respiratory failure, unspecified with hypoxia: Secondary | ICD-10-CM | POA: Diagnosis not present

## 2018-02-14 ENCOUNTER — Other Ambulatory Visit: Payer: Self-pay | Admitting: Internal Medicine

## 2018-02-14 DIAGNOSIS — J449 Chronic obstructive pulmonary disease, unspecified: Secondary | ICD-10-CM | POA: Diagnosis not present

## 2018-02-14 DIAGNOSIS — I10 Essential (primary) hypertension: Secondary | ICD-10-CM | POA: Diagnosis not present

## 2018-02-14 DIAGNOSIS — R590 Localized enlarged lymph nodes: Secondary | ICD-10-CM | POA: Diagnosis not present

## 2018-02-14 DIAGNOSIS — M810 Age-related osteoporosis without current pathological fracture: Secondary | ICD-10-CM | POA: Diagnosis not present

## 2018-02-14 DIAGNOSIS — K7290 Hepatic failure, unspecified without coma: Secondary | ICD-10-CM | POA: Diagnosis not present

## 2018-02-14 DIAGNOSIS — E441 Mild protein-calorie malnutrition: Secondary | ICD-10-CM | POA: Diagnosis not present

## 2018-02-14 DIAGNOSIS — E785 Hyperlipidemia, unspecified: Secondary | ICD-10-CM | POA: Diagnosis not present

## 2018-02-14 DIAGNOSIS — G8929 Other chronic pain: Secondary | ICD-10-CM

## 2018-02-14 DIAGNOSIS — M545 Low back pain: Principal | ICD-10-CM

## 2018-02-14 DIAGNOSIS — R1313 Dysphagia, pharyngeal phase: Secondary | ICD-10-CM | POA: Diagnosis not present

## 2018-02-14 DIAGNOSIS — G4733 Obstructive sleep apnea (adult) (pediatric): Secondary | ICD-10-CM | POA: Diagnosis not present

## 2018-02-14 DIAGNOSIS — G43009 Migraine without aura, not intractable, without status migrainosus: Secondary | ICD-10-CM | POA: Diagnosis not present

## 2018-02-14 DIAGNOSIS — J9611 Chronic respiratory failure with hypoxia: Secondary | ICD-10-CM | POA: Diagnosis not present

## 2018-02-14 NOTE — Telephone Encounter (Signed)
Good Morning.  The only order I can see placed in  [facility] is for a DME for a [employer] not a Rollator walker which is what needs to be placed per [facility].  Please confirm that you placed an order for a Rollator walker.  Thanks for your help with this order.

## 2018-02-14 NOTE — Telephone Encounter (Signed)
It was a walker order I found on our list. Should replace and new order and you can change it.

## 2018-02-17 ENCOUNTER — Telehealth: Payer: Self-pay

## 2018-02-17 DIAGNOSIS — E785 Hyperlipidemia, unspecified: Secondary | ICD-10-CM | POA: Diagnosis not present

## 2018-02-17 DIAGNOSIS — M810 Age-related osteoporosis without current pathological fracture: Secondary | ICD-10-CM | POA: Diagnosis not present

## 2018-02-17 DIAGNOSIS — G43009 Migraine without aura, not intractable, without status migrainosus: Secondary | ICD-10-CM | POA: Diagnosis not present

## 2018-02-17 DIAGNOSIS — E441 Mild protein-calorie malnutrition: Secondary | ICD-10-CM | POA: Diagnosis not present

## 2018-02-17 DIAGNOSIS — J9611 Chronic respiratory failure with hypoxia: Secondary | ICD-10-CM | POA: Diagnosis not present

## 2018-02-17 DIAGNOSIS — I10 Essential (primary) hypertension: Secondary | ICD-10-CM | POA: Diagnosis not present

## 2018-02-17 DIAGNOSIS — R590 Localized enlarged lymph nodes: Secondary | ICD-10-CM | POA: Diagnosis not present

## 2018-02-17 DIAGNOSIS — K7290 Hepatic failure, unspecified without coma: Secondary | ICD-10-CM | POA: Diagnosis not present

## 2018-02-17 DIAGNOSIS — J449 Chronic obstructive pulmonary disease, unspecified: Secondary | ICD-10-CM | POA: Diagnosis not present

## 2018-02-17 DIAGNOSIS — G4733 Obstructive sleep apnea (adult) (pediatric): Secondary | ICD-10-CM | POA: Diagnosis not present

## 2018-02-17 DIAGNOSIS — R1313 Dysphagia, pharyngeal phase: Secondary | ICD-10-CM | POA: Diagnosis not present

## 2018-02-17 NOTE — Telephone Encounter (Signed)
[name] with [facility] at home requesting VO for speech therapy and swallowing. Please call back.

## 2018-02-17 NOTE — Telephone Encounter (Signed)
Left message on Cathy's VM requesting return call for VO. [name], RN, BSN

## 2018-02-18 NOTE — Telephone Encounter (Signed)
That is fine with me.

## 2018-02-18 NOTE — Telephone Encounter (Signed)
Cathy returned call. Gave verbal auth for speech therapy for swallowing twice weekly for 2 weeks, once weekly for one week and to use device called the "Breather" for expiratory muscle training. Will send to PCP to see if we are in agreement. [name], RN, BSN

## 2018-02-19 DIAGNOSIS — M810 Age-related osteoporosis without current pathological fracture: Secondary | ICD-10-CM | POA: Diagnosis not present

## 2018-02-19 DIAGNOSIS — K7290 Hepatic failure, unspecified without coma: Secondary | ICD-10-CM | POA: Diagnosis not present

## 2018-02-19 DIAGNOSIS — I10 Essential (primary) hypertension: Secondary | ICD-10-CM | POA: Diagnosis not present

## 2018-02-19 DIAGNOSIS — J449 Chronic obstructive pulmonary disease, unspecified: Secondary | ICD-10-CM | POA: Diagnosis not present

## 2018-02-19 DIAGNOSIS — R1313 Dysphagia, pharyngeal phase: Secondary | ICD-10-CM | POA: Diagnosis not present

## 2018-02-19 DIAGNOSIS — G43009 Migraine without aura, not intractable, without status migrainosus: Secondary | ICD-10-CM | POA: Diagnosis not present

## 2018-02-19 DIAGNOSIS — E441 Mild protein-calorie malnutrition: Secondary | ICD-10-CM | POA: Diagnosis not present

## 2018-02-19 DIAGNOSIS — J9611 Chronic respiratory failure with hypoxia: Secondary | ICD-10-CM | POA: Diagnosis not present

## 2018-02-19 DIAGNOSIS — R590 Localized enlarged lymph nodes: Secondary | ICD-10-CM | POA: Diagnosis not present

## 2018-02-19 DIAGNOSIS — G4733 Obstructive sleep apnea (adult) (pediatric): Secondary | ICD-10-CM | POA: Diagnosis not present

## 2018-02-19 DIAGNOSIS — E785 Hyperlipidemia, unspecified: Secondary | ICD-10-CM | POA: Diagnosis not present

## 2018-02-19 NOTE — Telephone Encounter (Signed)
I have sent Johaura a message through skype to let her know that I was able to reach out to pt at the number that is listed as pt's phone number.  Per Saralyn Pilar, she is going to try to reach back out to pt to see if the ID appt can be scheduled for pt.  If she runs into any problems, I stated to her to let me know and I could see if I could help her out anyway I could.

## 2018-02-19 NOTE — Telephone Encounter (Signed)
Called and spoke with pt letting him know the results of his labwork.  Asked pt if he has heard anything regarding the referral to an infectious disease doctor, and pt stated he has not heard anything. Pt stated the only appt he has scheduled other than f/u visit with MR in June is an appt with GI tomorrow, 5/16 due to liver failure.  At pt's last visit 02/04/18, an order was placed for pt to be referred to infectious disease.  PCC's please advise if you are able to help with pt being referred to ID doctor. MR is wanting pt to be seen as soon as possible, within 4 weeks or less.

## 2018-02-19 NOTE — Telephone Encounter (Signed)
According to the note on the referral someone by the name of [name] tried calling the patient on 02/18/2018. Tried to call to setup the appt no one answered the call and was unable to leave message because voicemail was full

## 2018-02-20 ENCOUNTER — Encounter: Payer: Self-pay | Admitting: Internal Medicine

## 2018-02-20 ENCOUNTER — Ambulatory Visit (INDEPENDENT_AMBULATORY_CARE_PROVIDER_SITE_OTHER): Payer: Medicare Other | Admitting: Internal Medicine

## 2018-02-20 VITALS — BP 100/66 | HR 100 | Ht 66.0 in | Wt 151.2 lb

## 2018-02-20 DIAGNOSIS — E441 Mild protein-calorie malnutrition: Secondary | ICD-10-CM | POA: Diagnosis not present

## 2018-02-20 DIAGNOSIS — G43009 Migraine without aura, not intractable, without status migrainosus: Secondary | ICD-10-CM | POA: Diagnosis not present

## 2018-02-20 DIAGNOSIS — K8033 Calculus of bile duct with acute cholangitis with obstruction: Secondary | ICD-10-CM | POA: Diagnosis not present

## 2018-02-20 DIAGNOSIS — K8020 Calculus of gallbladder without cholecystitis without obstruction: Secondary | ICD-10-CM | POA: Diagnosis not present

## 2018-02-20 DIAGNOSIS — I10 Essential (primary) hypertension: Secondary | ICD-10-CM | POA: Diagnosis not present

## 2018-02-20 DIAGNOSIS — M810 Age-related osteoporosis without current pathological fracture: Secondary | ICD-10-CM | POA: Diagnosis not present

## 2018-02-20 DIAGNOSIS — K7290 Hepatic failure, unspecified without coma: Secondary | ICD-10-CM | POA: Diagnosis not present

## 2018-02-20 DIAGNOSIS — J9611 Chronic respiratory failure with hypoxia: Secondary | ICD-10-CM | POA: Diagnosis not present

## 2018-02-20 DIAGNOSIS — Z86010 Personal history of colonic polyps: Secondary | ICD-10-CM

## 2018-02-20 DIAGNOSIS — R590 Localized enlarged lymph nodes: Secondary | ICD-10-CM | POA: Diagnosis not present

## 2018-02-20 DIAGNOSIS — R1313 Dysphagia, pharyngeal phase: Secondary | ICD-10-CM | POA: Diagnosis not present

## 2018-02-20 DIAGNOSIS — E785 Hyperlipidemia, unspecified: Secondary | ICD-10-CM | POA: Diagnosis not present

## 2018-02-20 DIAGNOSIS — G4733 Obstructive sleep apnea (adult) (pediatric): Secondary | ICD-10-CM | POA: Diagnosis not present

## 2018-02-20 DIAGNOSIS — J449 Chronic obstructive pulmonary disease, unspecified: Secondary | ICD-10-CM | POA: Diagnosis not present

## 2018-02-20 DIAGNOSIS — Z860101 Personal history of adenomatous and serrated colon polyps: Secondary | ICD-10-CM

## 2018-02-20 NOTE — Progress Notes (Signed)
[name] 72 y.o. Feb 07, 1946 [id number]  Assessment & Plan:   Encounter Diagnoses  Name Primary?  . Calculus of bile duct with acute cholangitis with obstruction- resolved Yes  . Gallstone suspected in neck of gallbladder on MRI   . Hx of adenomatous colonic polyps     He is recovered from his cholangitis at this time and is not in any shape to have an elective cholecystectomy.  The MRI at [facility] suggested a possible neck stone.  He needs to recover from his lung disease and then somewhat can consider a cholecystectomy but I think observation makes the most sense.  He will see me as needed.  He has a remote history of 2 adenomas maximum 10 mm but I would not do a routine colonoscopy on him given his health issues at this time.  I appreciate the opportunity to care for this patient. CC: Brad Nimrod, MD   Subjective:   Chief Complaint: History of common bile duct stone HPI The patient was admitted in January became severely ill with jaundice and fever and he was transferred to [facility] where they figured out he had a CBD stone and cholangitis.  He underwent an ERCP and all of that cleared up.  There was a stone in the neck of the gallbladder on MRI and they recommended a cholecystectomy.  He then developed problems with pneumonia and pulmonary infiltrates and probably has interstitial lung disease on an autoimmune basis and is now on home oxygen.  He is feeling stronger and better and has follow-up with ID due to Myco bacterial positive cultures (not TB) and pulmonary follow-up regarding his lung disease.  He has no digestive complaints at this time. No Known Allergies Current Meds  Medication Sig  . acetaminophen (TYLENOL) 325 MG tablet Take 2 tablets (650 mg total) by mouth every 6 (six) hours as needed for mild pain or moderate pain.  Marland Kitchen albuterol (PROAIR HFA) 108 (90 Base) MCG/ACT inhaler INHALE 2 PUFFS INTO THE LUNGS FOUR TIMES DAILY  . aspirin EC 81 MG tablet Take 1 tablet  (81 mg total) by mouth daily.  Marland Kitchen atorvastatin (LIPITOR) 20 MG tablet Take 1 tablet (20 mg total) by mouth daily. (Patient taking differently: Take 20 mg by mouth at bedtime. )  . cyclobenzaprine (FLEXERIL) 10 MG tablet Take 10 mg by mouth as needed for muscle spasms.  Marland Kitchen doxazosin (CARDURA) 1 MG tablet TAKE 1 TABLET(1 MG) BY MOUTH AT BEDTIME  . furosemide (LASIX) 20 MG tablet Take 20 mg by mouth daily.  Marland Kitchen gabapentin (NEURONTIN) 300 MG capsule Take 1 capsule (300 mg total) by mouth 2 (two) times daily.  Marland Kitchen HYDROcodone-acetaminophen (NORCO/VICODIN) 5-325 MG tablet Take 1 tablet by mouth 2 (two) times daily as needed (for pain).  . mirtazapine (REMERON) 15 MG tablet Take 1 tablet (15 mg total) by mouth at bedtime.  . Multiple Vitamins-Minerals (CENTRUM SILVER ADULT 50+) TABS Take 1 tablet by mouth daily.  . nitroGLYCERIN (NITROSTAT) 0.4 MG SL tablet Place 1 tablet (0.4 mg total) under the tongue every 5 (five) minutes as needed for chest pain. x3 doses  . OXYGEN Inhale 3-7 L/min into the lungs continuous.   . polyethylene glycol (MIRALAX / GLYCOLAX) packet Take 17 g by mouth daily as needed for mild constipation.   Marland Kitchen tiotropium (SPIRIVA) 18 MCG inhalation capsule Place 1 capsule (18 mcg total) into inhaler and inhale daily. (Patient taking differently: Place 18 mcg into inhaler and inhale at bedtime. )  Past Medical History:  Diagnosis Date  . Anxiety and depression   . Arthritis   . Asthma   . Cholangitis due to bile duct calculus with obstruction 10/2017  . COPD (chronic obstructive pulmonary disease) ([place])   . GERD (gastroesophageal reflux disease)   . HTN (hypertension)   . Hyperlipemia   . Migraine   . Personal history of alcoholism ([place])   . Pneumonia   . Sleep apnea   . Tobacco use   . Tubular adenoma 01/29/2008   Past Surgical History:  Procedure Laterality Date  . ANTERIOR FUSION CERVICAL SPINE  01/26/2004   two surgeries after two mechanical falls, per Dr. Donald Hubbard  . COLONOSCOPY W/  BIOPSIES  01/29/2008   Dr. Silvano Hubbard  . ESOPHAGOGASTRODUODENOSCOPY N/A 10/15/2017   Procedure: ESOPHAGOGASTRODUODENOSCOPY (EGD);  Surgeon: Brad Shipper, MD;  Location: [facility] ENDOSCOPY;  Service: Endoscopy;  Laterality: N/A;  . [place]   correction for crossed-eyes  . [place]   Social History   Social History Narrative   His son died in a car accident in 19. His granddaughter died in a house fire in 01-25-10.    He lives by himself in a boarding house. He prepares his own meals, he is able to go to the pharmacy and grocery store. He uses public transportation. He uses a cane at his baseline and is independent of ADLs. Most of his family live in [place], [place], or [place]--brother, nieces and nephews. He has a sister in [place], [place] but she is disabled. He attends [employer] in [place] and his [name] there, [name], calls him almost every other day and checks on him 3-4 times per month.       Divorced retired   No alcohol no drug use prior smoker   3 caffeinated beverages daily   family history includes Breast cancer in his other; Heart attack in his brother, father, mother, and sister; Pancreatic cancer in his brother; Stroke in his sister.   Review of Systems As per HPI.  He has back pain joint pain headaches dyspnea insomnia  Objective:   Physical Exam  @BP  100/66 (BP Location: Left Arm, Patient Position: Sitting, Cuff Size: Normal)   Pulse 100   Ht 5\' 6"  (1.676 m) Comment: height measured without shoes  Wt 151 lb 4 oz (68.6 kg)   BMI 24.41 kg/m @  General:  NAD Eyes:   anicteric Lungs:  clear Heart::  S1S2 no rubs, murmurs or gallops Abdomen:  soft and nontender, BS+ Ext:   no edema, cyanosis or clubbing    Data Reviewed:  See HPI

## 2018-02-20 NOTE — Patient Instructions (Signed)
Please return to see Dr. Carlean Purl as needed.

## 2018-02-21 DIAGNOSIS — I10 Essential (primary) hypertension: Secondary | ICD-10-CM | POA: Diagnosis not present

## 2018-02-21 DIAGNOSIS — G4733 Obstructive sleep apnea (adult) (pediatric): Secondary | ICD-10-CM | POA: Diagnosis not present

## 2018-02-21 DIAGNOSIS — R590 Localized enlarged lymph nodes: Secondary | ICD-10-CM | POA: Diagnosis not present

## 2018-02-21 DIAGNOSIS — R1313 Dysphagia, pharyngeal phase: Secondary | ICD-10-CM | POA: Diagnosis not present

## 2018-02-21 DIAGNOSIS — J9611 Chronic respiratory failure with hypoxia: Secondary | ICD-10-CM | POA: Diagnosis not present

## 2018-02-21 DIAGNOSIS — K7290 Hepatic failure, unspecified without coma: Secondary | ICD-10-CM | POA: Diagnosis not present

## 2018-02-21 DIAGNOSIS — G43009 Migraine without aura, not intractable, without status migrainosus: Secondary | ICD-10-CM | POA: Diagnosis not present

## 2018-02-21 DIAGNOSIS — J449 Chronic obstructive pulmonary disease, unspecified: Secondary | ICD-10-CM | POA: Diagnosis not present

## 2018-02-21 DIAGNOSIS — M810 Age-related osteoporosis without current pathological fracture: Secondary | ICD-10-CM | POA: Diagnosis not present

## 2018-02-21 DIAGNOSIS — E785 Hyperlipidemia, unspecified: Secondary | ICD-10-CM | POA: Diagnosis not present

## 2018-02-21 DIAGNOSIS — E441 Mild protein-calorie malnutrition: Secondary | ICD-10-CM | POA: Diagnosis not present

## 2018-02-21 LAB — ACID FAST CULTURE WITH REFLEXED SENSITIVITIES (MYCOBACTERIA): Acid Fast Culture: POSITIVE — AB

## 2018-02-21 LAB — RAPID GROWER BROTH SUSCEP.: Clarithromycin: 1

## 2018-02-21 LAB — AFB ORGANISM ID BY DNA PROBE
M AVIUM COMPLEX: NEGATIVE
M TUBERCULOSIS COMPLEX: NEGATIVE

## 2018-02-21 LAB — ORG ID BY SEQUENCING RFLX AST

## 2018-02-24 ENCOUNTER — Encounter: Payer: Self-pay | Admitting: Internal Medicine

## 2018-02-24 DIAGNOSIS — E785 Hyperlipidemia, unspecified: Secondary | ICD-10-CM | POA: Diagnosis not present

## 2018-02-24 DIAGNOSIS — M810 Age-related osteoporosis without current pathological fracture: Secondary | ICD-10-CM | POA: Diagnosis not present

## 2018-02-24 DIAGNOSIS — J449 Chronic obstructive pulmonary disease, unspecified: Secondary | ICD-10-CM | POA: Diagnosis not present

## 2018-02-24 DIAGNOSIS — E441 Mild protein-calorie malnutrition: Secondary | ICD-10-CM | POA: Diagnosis not present

## 2018-02-24 DIAGNOSIS — G43009 Migraine without aura, not intractable, without status migrainosus: Secondary | ICD-10-CM | POA: Diagnosis not present

## 2018-02-24 DIAGNOSIS — I10 Essential (primary) hypertension: Secondary | ICD-10-CM | POA: Diagnosis not present

## 2018-02-24 DIAGNOSIS — K7290 Hepatic failure, unspecified without coma: Secondary | ICD-10-CM | POA: Diagnosis not present

## 2018-02-24 DIAGNOSIS — G4733 Obstructive sleep apnea (adult) (pediatric): Secondary | ICD-10-CM | POA: Diagnosis not present

## 2018-02-24 DIAGNOSIS — J9611 Chronic respiratory failure with hypoxia: Secondary | ICD-10-CM | POA: Diagnosis not present

## 2018-02-24 DIAGNOSIS — R590 Localized enlarged lymph nodes: Secondary | ICD-10-CM | POA: Diagnosis not present

## 2018-02-24 DIAGNOSIS — R1313 Dysphagia, pharyngeal phase: Secondary | ICD-10-CM | POA: Diagnosis not present

## 2018-02-26 ENCOUNTER — Telehealth: Payer: Self-pay

## 2018-02-26 DIAGNOSIS — G43009 Migraine without aura, not intractable, without status migrainosus: Secondary | ICD-10-CM | POA: Diagnosis not present

## 2018-02-26 DIAGNOSIS — I10 Essential (primary) hypertension: Secondary | ICD-10-CM | POA: Diagnosis not present

## 2018-02-26 DIAGNOSIS — E441 Mild protein-calorie malnutrition: Secondary | ICD-10-CM | POA: Diagnosis not present

## 2018-02-26 DIAGNOSIS — E785 Hyperlipidemia, unspecified: Secondary | ICD-10-CM | POA: Diagnosis not present

## 2018-02-26 DIAGNOSIS — G4733 Obstructive sleep apnea (adult) (pediatric): Secondary | ICD-10-CM | POA: Diagnosis not present

## 2018-02-26 DIAGNOSIS — J9611 Chronic respiratory failure with hypoxia: Secondary | ICD-10-CM | POA: Diagnosis not present

## 2018-02-26 DIAGNOSIS — R590 Localized enlarged lymph nodes: Secondary | ICD-10-CM | POA: Diagnosis not present

## 2018-02-26 DIAGNOSIS — M810 Age-related osteoporosis without current pathological fracture: Secondary | ICD-10-CM | POA: Diagnosis not present

## 2018-02-26 DIAGNOSIS — R1313 Dysphagia, pharyngeal phase: Secondary | ICD-10-CM | POA: Diagnosis not present

## 2018-02-26 DIAGNOSIS — K7290 Hepatic failure, unspecified without coma: Secondary | ICD-10-CM | POA: Diagnosis not present

## 2018-02-26 DIAGNOSIS — J449 Chronic obstructive pulmonary disease, unspecified: Secondary | ICD-10-CM | POA: Diagnosis not present

## 2018-02-26 NOTE — Telephone Encounter (Signed)
Pt called today requesting an appt to see one of our providers. We have a referral sent to [place] to be scheduled. Pt is scheduled to see Dr. Baxter Flattery 03/10/18 a 3:30 pm. PT is aware of this appt. [place]

## 2018-02-27 ENCOUNTER — Ambulatory Visit: Payer: Medicare Other | Admitting: Family

## 2018-02-28 DIAGNOSIS — E441 Mild protein-calorie malnutrition: Secondary | ICD-10-CM | POA: Diagnosis not present

## 2018-02-28 DIAGNOSIS — R1313 Dysphagia, pharyngeal phase: Secondary | ICD-10-CM | POA: Diagnosis not present

## 2018-02-28 DIAGNOSIS — J449 Chronic obstructive pulmonary disease, unspecified: Secondary | ICD-10-CM | POA: Diagnosis not present

## 2018-02-28 DIAGNOSIS — R590 Localized enlarged lymph nodes: Secondary | ICD-10-CM | POA: Diagnosis not present

## 2018-02-28 DIAGNOSIS — J9611 Chronic respiratory failure with hypoxia: Secondary | ICD-10-CM | POA: Diagnosis not present

## 2018-02-28 DIAGNOSIS — G43009 Migraine without aura, not intractable, without status migrainosus: Secondary | ICD-10-CM | POA: Diagnosis not present

## 2018-02-28 DIAGNOSIS — K7290 Hepatic failure, unspecified without coma: Secondary | ICD-10-CM | POA: Diagnosis not present

## 2018-02-28 DIAGNOSIS — G4733 Obstructive sleep apnea (adult) (pediatric): Secondary | ICD-10-CM | POA: Diagnosis not present

## 2018-02-28 DIAGNOSIS — E785 Hyperlipidemia, unspecified: Secondary | ICD-10-CM | POA: Diagnosis not present

## 2018-02-28 DIAGNOSIS — I10 Essential (primary) hypertension: Secondary | ICD-10-CM | POA: Diagnosis not present

## 2018-02-28 DIAGNOSIS — M810 Age-related osteoporosis without current pathological fracture: Secondary | ICD-10-CM | POA: Diagnosis not present

## 2018-03-05 DIAGNOSIS — I10 Essential (primary) hypertension: Secondary | ICD-10-CM | POA: Diagnosis not present

## 2018-03-05 DIAGNOSIS — E441 Mild protein-calorie malnutrition: Secondary | ICD-10-CM | POA: Diagnosis not present

## 2018-03-05 DIAGNOSIS — E785 Hyperlipidemia, unspecified: Secondary | ICD-10-CM | POA: Diagnosis not present

## 2018-03-05 DIAGNOSIS — R590 Localized enlarged lymph nodes: Secondary | ICD-10-CM | POA: Diagnosis not present

## 2018-03-05 DIAGNOSIS — G43009 Migraine without aura, not intractable, without status migrainosus: Secondary | ICD-10-CM | POA: Diagnosis not present

## 2018-03-05 DIAGNOSIS — R1313 Dysphagia, pharyngeal phase: Secondary | ICD-10-CM | POA: Diagnosis not present

## 2018-03-05 DIAGNOSIS — J449 Chronic obstructive pulmonary disease, unspecified: Secondary | ICD-10-CM | POA: Diagnosis not present

## 2018-03-05 DIAGNOSIS — M810 Age-related osteoporosis without current pathological fracture: Secondary | ICD-10-CM | POA: Diagnosis not present

## 2018-03-05 DIAGNOSIS — J9611 Chronic respiratory failure with hypoxia: Secondary | ICD-10-CM | POA: Diagnosis not present

## 2018-03-05 DIAGNOSIS — G4733 Obstructive sleep apnea (adult) (pediatric): Secondary | ICD-10-CM | POA: Diagnosis not present

## 2018-03-05 DIAGNOSIS — K7290 Hepatic failure, unspecified without coma: Secondary | ICD-10-CM | POA: Diagnosis not present

## 2018-03-07 ENCOUNTER — Telehealth: Payer: Self-pay | Admitting: Internal Medicine

## 2018-03-07 DIAGNOSIS — I10 Essential (primary) hypertension: Secondary | ICD-10-CM | POA: Diagnosis not present

## 2018-03-07 DIAGNOSIS — J449 Chronic obstructive pulmonary disease, unspecified: Secondary | ICD-10-CM | POA: Diagnosis not present

## 2018-03-07 DIAGNOSIS — K7290 Hepatic failure, unspecified without coma: Secondary | ICD-10-CM | POA: Diagnosis not present

## 2018-03-07 DIAGNOSIS — J9611 Chronic respiratory failure with hypoxia: Secondary | ICD-10-CM | POA: Diagnosis not present

## 2018-03-07 DIAGNOSIS — E441 Mild protein-calorie malnutrition: Secondary | ICD-10-CM | POA: Diagnosis not present

## 2018-03-07 DIAGNOSIS — G43009 Migraine without aura, not intractable, without status migrainosus: Secondary | ICD-10-CM | POA: Diagnosis not present

## 2018-03-07 DIAGNOSIS — G4733 Obstructive sleep apnea (adult) (pediatric): Secondary | ICD-10-CM | POA: Diagnosis not present

## 2018-03-07 DIAGNOSIS — E785 Hyperlipidemia, unspecified: Secondary | ICD-10-CM | POA: Diagnosis not present

## 2018-03-07 DIAGNOSIS — R1313 Dysphagia, pharyngeal phase: Secondary | ICD-10-CM | POA: Diagnosis not present

## 2018-03-07 DIAGNOSIS — R590 Localized enlarged lymph nodes: Secondary | ICD-10-CM | POA: Diagnosis not present

## 2018-03-07 DIAGNOSIS — M810 Age-related osteoporosis without current pathological fracture: Secondary | ICD-10-CM | POA: Diagnosis not present

## 2018-03-07 NOTE — Telephone Encounter (Signed)
Verbal orders  Speech swallowing 2x a week for 4 weeks eff. June 3rd

## 2018-03-07 NOTE — Telephone Encounter (Signed)
That is fine with me.

## 2018-03-10 ENCOUNTER — Encounter: Payer: Self-pay | Admitting: Internal Medicine

## 2018-03-10 ENCOUNTER — Ambulatory Visit (INDEPENDENT_AMBULATORY_CARE_PROVIDER_SITE_OTHER): Payer: Medicare Other | Admitting: Internal Medicine

## 2018-03-10 VITALS — BP 113/77 | HR 79 | Temp 97.8°F | Ht 67.0 in | Wt 152.0 lb

## 2018-03-10 DIAGNOSIS — A318 Other mycobacterial infections: Secondary | ICD-10-CM

## 2018-03-10 DIAGNOSIS — I10 Essential (primary) hypertension: Secondary | ICD-10-CM | POA: Diagnosis not present

## 2018-03-10 DIAGNOSIS — J9611 Chronic respiratory failure with hypoxia: Secondary | ICD-10-CM | POA: Diagnosis not present

## 2018-03-10 DIAGNOSIS — E441 Mild protein-calorie malnutrition: Secondary | ICD-10-CM | POA: Diagnosis not present

## 2018-03-10 DIAGNOSIS — R1313 Dysphagia, pharyngeal phase: Secondary | ICD-10-CM | POA: Diagnosis not present

## 2018-03-10 DIAGNOSIS — G4733 Obstructive sleep apnea (adult) (pediatric): Secondary | ICD-10-CM | POA: Diagnosis not present

## 2018-03-10 DIAGNOSIS — J449 Chronic obstructive pulmonary disease, unspecified: Secondary | ICD-10-CM | POA: Diagnosis not present

## 2018-03-10 DIAGNOSIS — A319 Mycobacterial infection, unspecified: Secondary | ICD-10-CM

## 2018-03-10 DIAGNOSIS — M810 Age-related osteoporosis without current pathological fracture: Secondary | ICD-10-CM | POA: Diagnosis not present

## 2018-03-10 DIAGNOSIS — K7290 Hepatic failure, unspecified without coma: Secondary | ICD-10-CM | POA: Diagnosis not present

## 2018-03-10 DIAGNOSIS — G43009 Migraine without aura, not intractable, without status migrainosus: Secondary | ICD-10-CM | POA: Diagnosis not present

## 2018-03-10 DIAGNOSIS — E785 Hyperlipidemia, unspecified: Secondary | ICD-10-CM | POA: Diagnosis not present

## 2018-03-10 DIAGNOSIS — R590 Localized enlarged lymph nodes: Secondary | ICD-10-CM | POA: Diagnosis not present

## 2018-03-10 NOTE — Progress Notes (Signed)
RFV: pulmonary NTM-m.abscessus  Patient ID: [name], male   DOB: 07-Jun-1946, 72 y.o.   MRN: [id number]  HPI  On 3L [place], fatigue, and weight loss.  [name] is a 72 y.o. male with a history of COPD, hypertension, dyslipidemia and GERD who developed severe abdominal pain, nausea and vomiting shortly after eating his dinner meal on 10/13/2016 .during his admission,  He had low-grade fever, abdominal distention and was found to have elevated liver enzymes and bilirubin.  He was started on empiric therapy with piperacillin tazobactam.  CT scan of the abdomen and ultrasound did not reveal any gallstones or other acute abnormalities.  Serologies for hepatitis A, B, C, CMV and HIV were negative.  His fever started to increase on the third day of admission.  Blood cultures were repeated and are negative.  Chest x-ray and chest CT revealed groundglass opacities bilaterally with upper lobe predominance.  He has increased cough productive of thin, pink sputum.  sputum specimen found m.abscessus as well as m.mucogenicum. He was referred to ID clinic to consider treatment. He reports that he does not have worsening cough. He does has 3L [place] need but also reports having fatigue, and some unintentional weight loss.  Soc hx:  He is retired and lives alone.  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  He has no pets or animals or unusual exposures recently.  He has had no recent travel.  He has a remote history of exposure to a cousin who had active tuberculosis but says that he has been tested on numerous occasions and was always negative.   Outpatient Encounter Medications as of 03/10/2018  Medication Sig  . acetaminophen (TYLENOL) 325 MG tablet Take 2 tablets (650 mg total) by mouth every 6 (six) hours as needed for mild pain or moderate pain.  Marland Kitchen albuterol (PROAIR HFA) 108 (90 Base) MCG/ACT inhaler INHALE 2  PUFFS INTO THE LUNGS FOUR TIMES DAILY  . aspirin EC 81 MG tablet Take 1 tablet (81 mg total) by mouth daily.  Marland Kitchen atorvastatin (LIPITOR) 20 MG tablet Take 1 tablet (20 mg total) by mouth daily. (Patient taking differently: Take 20 mg by mouth at bedtime. )  . cyclobenzaprine (FLEXERIL) 10 MG tablet Take 10 mg by mouth as needed for muscle spasms.  Marland Kitchen doxazosin (CARDURA) 1 MG tablet TAKE 1 TABLET(1 MG) BY MOUTH AT BEDTIME  . furosemide (LASIX) 20 MG tablet Take 20 mg by mouth daily.  Marland Kitchen gabapentin (NEURONTIN) 300 MG capsule Take 1 capsule (300 mg total) by mouth 2 (two) times daily.  Marland Kitchen HYDROcodone-acetaminophen (NORCO/VICODIN) 5-325 MG tablet Take 1 tablet by mouth 2 (two) times daily as needed (for pain).  . mirtazapine (REMERON) 15 MG tablet Take 1 tablet (15 mg total) by mouth at bedtime.  . Multiple Vitamins-Minerals (CENTRUM SILVER ADULT 50+) TABS Take 1 tablet by mouth daily.  . nitroGLYCERIN (NITROSTAT) 0.4 MG SL tablet Place 1 tablet (0.4 mg total) under the tongue every 5 (five) minutes as needed for chest pain. x3 doses  . OXYGEN Inhale 3-7 L/min into the lungs continuous.   . polyethylene glycol (MIRALAX / GLYCOLAX) packet Take 17 g by mouth daily as needed for mild constipation.   Marland Kitchen tiotropium (SPIRIVA) 18 MCG inhalation capsule Place 1 capsule (18 mcg total) into inhaler and inhale daily. (Patient taking differently: Place 18 mcg into inhaler and inhale at bedtime. )   No  facility-administered encounter medications on file as of 03/10/2018.      Patient Active Problem List   Diagnosis Date Noted  . Dysphagia 01/13/2018  . Silent aspiration 01/13/2018  . Cholelithiasis without obstruction 01/13/2018  . Chronic hypoxemic respiratory failure ([place]) 01/08/2018  . Atypical chest pain 01/08/2018  . HCAP (healthcare-associated pneumonia) 01/08/2018  . Right carotid bruit 11/19/2017  . Malnutrition of moderate degree 10/22/2017  . Ground glass opacity present on imaging of lung   .  Thrombocytopenia ([place]) 10/16/2017  . Gastroparesis   . Osteoporosis 06/27/2017  . OSA (obstructive sleep apnea) 08/23/2014  . Hx of migraines 05/26/2014  . Back pain, chronic 05/26/2014  . History of colonic polyps 12/29/2012  . Hyperlipemia 11/12/2012  . COPD (chronic obstructive pulmonary disease) ([place]) 11/12/2012  . Leg swelling 11/12/2012  . HTN (hypertension) 11/12/2012  . Tobacco use 11/12/2012     Health Maintenance Due  Topic Date Due  . COLONOSCOPY  01/28/2018     Review of Systems Per hpi, otherwise 12 point is negative Physical Exam   BP 113/77   Pulse 79   Temp 97.8 F (36.6 C) (Oral)   Ht 5\' 7"  (1.702 m)   Wt 152 lb (68.9 kg)   SpO2 99% Comment: 3L [place]  BMI 23.81 kg/m   Physical Exam  Constitutional: He is oriented to person, place, and time. He appears stated, age, frail and underweight. No distress.  HENT:  Mouth/Throat: Oropharynx is clear and moist. No oropharyngeal exudate.  Cardiovascular: Normal rate, regular rhythm and normal heart sounds. Exam reveals no gallop and no friction rub.  No murmur heard.  Pulmonary/Chest: Effort normal and breath sounds normal. No respiratory distress. He has no wheezes.  Abdominal: Soft. Bowel sounds are normal. He exhibits no distension. There is no tenderness.  Lymphadenopathy:  He has no cervical adenopathy.  Neurological: He is alert and oriented to person, place, and time.  Skin: Skin is warm and dry. No rash noted. No erythema.  Psychiatric: He has a normal mood and affect. His behavior is normal.    Lab Results  Component Value Date   LABRPR NON REAC 01/28/2009    CBC Lab Results  Component Value Date   WBC 11.3 (H) 02/05/2018   RBC 4.41 02/05/2018   HGB 12.9 (L) 02/05/2018   HCT 39.3 02/05/2018   PLT 248 02/05/2018   MCV 89.1 02/05/2018   MCH 29.3 02/05/2018   MCHC 32.8 02/05/2018   RDW 14.6 02/05/2018   LYMPHSABS 1.7 01/07/2018   MONOABS 0.7 01/07/2018   EOSABS 0.2 01/07/2018    BMET Lab  Results  Component Value Date   NA 137 02/05/2018   K 3.3 (L) 02/05/2018   CL 102 02/05/2018   CO2 26 02/05/2018   GLUCOSE 104 (H) 02/05/2018   BUN 6 02/05/2018   CREATININE 0.80 02/05/2018   CALCIUM 8.9 02/05/2018   GFRNONAA >60 02/05/2018   GFRAA >60 02/05/2018      Assessment and Plan  Pulmonary M.abscessus =  Will give 2 sputum afb to drop off on Friday to decide if need treatment with further identification. He has very R m.abscessus which is challenging to treat. Will discuss with his pulmonologist to see if can increase pulmonary hygiene ot see if any symptomatic relief. If we can re-isolate, will need consider treatment

## 2018-03-11 ENCOUNTER — Telehealth: Payer: Self-pay

## 2018-03-11 NOTE — Telephone Encounter (Signed)
Is fine with me.

## 2018-03-11 NOTE — Telephone Encounter (Signed)
Verbal Josem Kaufmann given to [facility] for speech therapy twice weekly for 4 weeks for pharyngeal dysphagia. Will route to PCP to see if we are in agreement. [name], RN, BSN

## 2018-03-11 NOTE — Telephone Encounter (Signed)
[name] with [facility] requesting VO for speech therapy. Please call back.

## 2018-03-11 NOTE — Telephone Encounter (Signed)
[name] with [facility] was notified of verbal auth for speech therapy today. Please see separate phone encounter from today. [name], RN, BSN

## 2018-03-13 DIAGNOSIS — I10 Essential (primary) hypertension: Secondary | ICD-10-CM | POA: Diagnosis not present

## 2018-03-13 DIAGNOSIS — J849 Interstitial pulmonary disease, unspecified: Secondary | ICD-10-CM | POA: Diagnosis not present

## 2018-03-13 DIAGNOSIS — R092 Respiratory arrest: Secondary | ICD-10-CM | POA: Diagnosis not present

## 2018-03-13 DIAGNOSIS — G4733 Obstructive sleep apnea (adult) (pediatric): Secondary | ICD-10-CM | POA: Diagnosis not present

## 2018-03-13 DIAGNOSIS — E785 Hyperlipidemia, unspecified: Secondary | ICD-10-CM | POA: Diagnosis not present

## 2018-03-13 DIAGNOSIS — G43009 Migraine without aura, not intractable, without status migrainosus: Secondary | ICD-10-CM | POA: Diagnosis not present

## 2018-03-13 DIAGNOSIS — J449 Chronic obstructive pulmonary disease, unspecified: Secondary | ICD-10-CM | POA: Diagnosis not present

## 2018-03-13 DIAGNOSIS — R1313 Dysphagia, pharyngeal phase: Secondary | ICD-10-CM | POA: Diagnosis not present

## 2018-03-13 DIAGNOSIS — M4856XD Collapsed vertebra, not elsewhere classified, lumbar region, subsequent encounter for fracture with routine healing: Secondary | ICD-10-CM | POA: Diagnosis not present

## 2018-03-13 DIAGNOSIS — K7290 Hepatic failure, unspecified without coma: Secondary | ICD-10-CM | POA: Diagnosis not present

## 2018-03-13 DIAGNOSIS — E441 Mild protein-calorie malnutrition: Secondary | ICD-10-CM | POA: Diagnosis not present

## 2018-03-13 DIAGNOSIS — M810 Age-related osteoporosis without current pathological fracture: Secondary | ICD-10-CM | POA: Diagnosis not present

## 2018-03-13 DIAGNOSIS — R590 Localized enlarged lymph nodes: Secondary | ICD-10-CM | POA: Diagnosis not present

## 2018-03-13 DIAGNOSIS — J9611 Chronic respiratory failure with hypoxia: Secondary | ICD-10-CM | POA: Diagnosis not present

## 2018-03-14 ENCOUNTER — Ambulatory Visit (INDEPENDENT_AMBULATORY_CARE_PROVIDER_SITE_OTHER): Payer: Medicare Other | Admitting: Internal Medicine

## 2018-03-14 ENCOUNTER — Other Ambulatory Visit: Payer: Self-pay

## 2018-03-14 ENCOUNTER — Encounter: Payer: Self-pay | Admitting: Internal Medicine

## 2018-03-14 VITALS — BP 126/73 | HR 100 | Temp 97.8°F | Ht 67.5 in | Wt 155.5 lb

## 2018-03-14 DIAGNOSIS — J9611 Chronic respiratory failure with hypoxia: Secondary | ICD-10-CM

## 2018-03-14 DIAGNOSIS — J849 Interstitial pulmonary disease, unspecified: Secondary | ICD-10-CM | POA: Diagnosis not present

## 2018-03-14 DIAGNOSIS — K8020 Calculus of gallbladder without cholecystitis without obstruction: Secondary | ICD-10-CM | POA: Diagnosis not present

## 2018-03-14 DIAGNOSIS — R51 Headache: Secondary | ICD-10-CM

## 2018-03-14 DIAGNOSIS — Z720 Tobacco use: Secondary | ICD-10-CM

## 2018-03-14 DIAGNOSIS — F17211 Nicotine dependence, cigarettes, in remission: Secondary | ICD-10-CM

## 2018-03-14 DIAGNOSIS — Z9981 Dependence on supplemental oxygen: Secondary | ICD-10-CM | POA: Diagnosis not present

## 2018-03-14 DIAGNOSIS — I10 Essential (primary) hypertension: Secondary | ICD-10-CM | POA: Diagnosis not present

## 2018-03-14 DIAGNOSIS — H53149 Visual discomfort, unspecified: Secondary | ICD-10-CM | POA: Diagnosis not present

## 2018-03-14 DIAGNOSIS — G4489 Other headache syndrome: Secondary | ICD-10-CM

## 2018-03-14 DIAGNOSIS — R519 Headache, unspecified: Secondary | ICD-10-CM | POA: Insufficient documentation

## 2018-03-14 MED ORDER — KETOROLAC TROMETHAMINE 30 MG/ML IJ SOLN
30.0000 mg | Freq: Once | INTRAMUSCULAR | Status: AC
  Administered 2018-03-14: 30 mg via INTRAMUSCULAR

## 2018-03-14 NOTE — Assessment & Plan Note (Signed)
BP Readings from Last 3 Encounters:  03/14/18 126/73  03/10/18 113/77  02/20/18 100/66   He was normotensive with Cardura, does not required any other antihypertensive.  Continue Cardura and keep monitoring.

## 2018-03-14 NOTE — Assessment & Plan Note (Signed)
Patient quit smoking in December 2018.  History of 1-1/2 to 2 pack/day smoking for 45 to 50 years.

## 2018-03-14 NOTE — Assessment & Plan Note (Signed)
Patient has interstitial lung disease, recent CT with worsening of endobronchial thickening.  Patient was very frustrated with pulmonologist, stating that they are not helping.  Discussed with him that he has a progressive disease which will not get better, rather worsening with time. A bunch of autoimmune labs were drawn by pulmonology with very nonspecific results. He was also referred to infectious disease by pulmonologist to have further work-up done on his positive acid-fast bacilli culture-new cultures were obtained by infectious disease recently-results pending.  We discussed that he should follow-up with pulmonologist according to his schedule appointment later this month, if he thinks that it is not helping him we will refer him to interstitial lung disease clinic at [facility].  Patient do have some issues with transportation.  He was also provided with a new prescription for DME portable oxygen.

## 2018-03-14 NOTE — Patient Instructions (Signed)
Thank you for visiting clinic today. I am giving you a Toradol injection for your headache, see if that will help. We are also placing order with a different company for your walker and portable oxygen. They should call you to set up an appointment for delivery. Please follow-up with your pulmonologist and infectious disease doctor according to your scheduled appointment. As we discussed we can refer you to a specialized clinic at [facility] if needed during next follow-up appointment in 1 month.

## 2018-03-14 NOTE — Assessment & Plan Note (Signed)
He was complaining of headache which was not relieved with Norco with mild photophobia and no other red flag.  Toradol 30 mg IM injection was given.

## 2018-03-14 NOTE — Progress Notes (Signed)
   CC: For follow-up of his hypertension.  HPI:  [name] is a 72 y.o. with past medical history as listed below came to the clinic for follow-up of his hypertension.  He was also complaining of headache for the past 3 days, associated with mild photophobia, denies any blurry vision no nausea no vomiting.  No focal weakness.  Patient was little frustrated that nobody is helping, as surgery does not want to do cholecystectomy because of his worsening interstitial lung disease and would like to keep observing.  He also follow-up with pulmonology and does not think that they are helpful.  He made a recent visit with infectious disease to further investigate his Mycobacterium  positive culture-new cultures were taken.  He was also  asking for a 4 wheeler walker with a seat, order was placed on Feb 14, 2018 but patient has not received it yet.  He was also asking for a portable oxygen compressor, as it is very difficult for him to drag a cylinder with him.  Order was placed.  Please see assessment and plan for his other chronic conditions.  Past Medical History:  Diagnosis Date  . Anxiety and depression   . Arthritis   . Asthma   . Cholangitis due to bile duct calculus with obstruction 10/2017  . COPD (chronic obstructive pulmonary disease) ([place])   . GERD (gastroesophageal reflux disease)   . HTN (hypertension)   . Hyperlipemia   . Migraine   . Personal history of alcoholism ([place])   . Pneumonia   . Sleep apnea   . Tobacco use   . Tubular adenoma 01/29/2008   Review of Systems: Negative except mentioned in HPI.  Physical Exam:  Vitals:   03/14/18 1326  BP: 126/73  Pulse: 100  Temp: 97.8 F (36.6 C)  TempSrc: Oral  SpO2: 98%  Weight: 155 lb 8 oz (70.5 kg)  Height: 5' 7.5" (1.715 m)    General: Vital signs reviewed.  Patient is well-developed and well-nourished, in no acute distress and cooperative with exam.  Head: Normocephalic and atraumatic. Eyes: EOMI,  conjunctivae normal, no scleral icterus.  Cardiovascular: RRR, S1 normal, S2 normal, no murmurs, gallops, or rubs. Pulmonary/Chest: Coarse breath sounds with few scattered rhonchi more prominent on right base. Abdominal: Soft, non-tender, non-distended, BS +, no masses, organomegaly, or guarding present.  Extremities: No lower extremity edema bilaterally,  pulses symmetric and intact bilaterally. No cyanosis or clubbing. Neurological: A&O x3, Strength is normal and symmetric bilaterally, cranial nerve II-XII are grossly intact, no focal motor deficit, sensory intact to light touch bilaterally.  Skin: Warm, dry and intact. No rashes or erythema. Psychiatric: Normal mood and affect. speech and behavior is normal. Cognition and memory are normal.  Assessment & Plan:   See Encounters Tab for problem based charting.  Patient discussed with Dr. Evette Doffing.

## 2018-03-14 NOTE — Assessment & Plan Note (Signed)
Follow-up with GI, general surgery does not want to proceed with elective cholecystectomy because of his advanced lung disease.  No current abdominal pain or dysphagia.  Keep monitoring.

## 2018-03-17 DIAGNOSIS — J9611 Chronic respiratory failure with hypoxia: Secondary | ICD-10-CM | POA: Diagnosis not present

## 2018-03-17 DIAGNOSIS — G4733 Obstructive sleep apnea (adult) (pediatric): Secondary | ICD-10-CM | POA: Diagnosis not present

## 2018-03-17 DIAGNOSIS — K7290 Hepatic failure, unspecified without coma: Secondary | ICD-10-CM | POA: Diagnosis not present

## 2018-03-17 DIAGNOSIS — I10 Essential (primary) hypertension: Secondary | ICD-10-CM | POA: Diagnosis not present

## 2018-03-17 DIAGNOSIS — E785 Hyperlipidemia, unspecified: Secondary | ICD-10-CM | POA: Diagnosis not present

## 2018-03-17 DIAGNOSIS — R1313 Dysphagia, pharyngeal phase: Secondary | ICD-10-CM | POA: Diagnosis not present

## 2018-03-17 DIAGNOSIS — R590 Localized enlarged lymph nodes: Secondary | ICD-10-CM | POA: Diagnosis not present

## 2018-03-17 DIAGNOSIS — E441 Mild protein-calorie malnutrition: Secondary | ICD-10-CM | POA: Diagnosis not present

## 2018-03-17 DIAGNOSIS — G43009 Migraine without aura, not intractable, without status migrainosus: Secondary | ICD-10-CM | POA: Diagnosis not present

## 2018-03-17 DIAGNOSIS — M810 Age-related osteoporosis without current pathological fracture: Secondary | ICD-10-CM | POA: Diagnosis not present

## 2018-03-17 DIAGNOSIS — J449 Chronic obstructive pulmonary disease, unspecified: Secondary | ICD-10-CM | POA: Diagnosis not present

## 2018-03-17 NOTE — Progress Notes (Signed)
Internal Medicine Clinic Attending  Case discussed with Dr. Amin soon after the resident saw the patient.  We reviewed the resident's history and exam and pertinent patient test results.  I agree with the assessment, diagnosis, and plan of care documented in the resident's note.    

## 2018-03-18 ENCOUNTER — Ambulatory Visit: Payer: Medicare Other | Admitting: Family

## 2018-03-18 ENCOUNTER — Other Ambulatory Visit: Payer: Self-pay

## 2018-03-18 NOTE — Patient Outreach (Signed)
[facility]) Care Management  03/18/2018  [name] Mar 03, 1946 [id number]   Medication Adherence call to [name] left a message for patient to call back patient is due on Atorvastatin 20 mg. [facility] said he has not pick up since April/2019. [name] is showing past due under [place].    [facility] Management Direct Dial [phone]  Fax [phone] [name]

## 2018-03-19 ENCOUNTER — Other Ambulatory Visit: Payer: Medicare Other

## 2018-03-19 DIAGNOSIS — E785 Hyperlipidemia, unspecified: Secondary | ICD-10-CM | POA: Diagnosis not present

## 2018-03-19 DIAGNOSIS — M810 Age-related osteoporosis without current pathological fracture: Secondary | ICD-10-CM | POA: Diagnosis not present

## 2018-03-19 DIAGNOSIS — G43009 Migraine without aura, not intractable, without status migrainosus: Secondary | ICD-10-CM | POA: Diagnosis not present

## 2018-03-19 DIAGNOSIS — A318 Other mycobacterial infections: Secondary | ICD-10-CM

## 2018-03-19 DIAGNOSIS — R1313 Dysphagia, pharyngeal phase: Secondary | ICD-10-CM | POA: Diagnosis not present

## 2018-03-19 DIAGNOSIS — R590 Localized enlarged lymph nodes: Secondary | ICD-10-CM | POA: Diagnosis not present

## 2018-03-19 DIAGNOSIS — A319 Mycobacterial infection, unspecified: Secondary | ICD-10-CM | POA: Diagnosis not present

## 2018-03-19 DIAGNOSIS — J449 Chronic obstructive pulmonary disease, unspecified: Secondary | ICD-10-CM | POA: Diagnosis not present

## 2018-03-19 DIAGNOSIS — G4733 Obstructive sleep apnea (adult) (pediatric): Secondary | ICD-10-CM | POA: Diagnosis not present

## 2018-03-19 DIAGNOSIS — J9611 Chronic respiratory failure with hypoxia: Secondary | ICD-10-CM | POA: Diagnosis not present

## 2018-03-19 DIAGNOSIS — E441 Mild protein-calorie malnutrition: Secondary | ICD-10-CM | POA: Diagnosis not present

## 2018-03-19 DIAGNOSIS — I10 Essential (primary) hypertension: Secondary | ICD-10-CM | POA: Diagnosis not present

## 2018-03-19 DIAGNOSIS — K7290 Hepatic failure, unspecified without coma: Secondary | ICD-10-CM | POA: Diagnosis not present

## 2018-03-24 ENCOUNTER — Telehealth: Payer: Self-pay | Admitting: Internal Medicine

## 2018-03-24 ENCOUNTER — Other Ambulatory Visit: Payer: Self-pay | Admitting: Internal Medicine

## 2018-03-24 DIAGNOSIS — R1310 Dysphagia, unspecified: Secondary | ICD-10-CM

## 2018-03-24 NOTE — Telephone Encounter (Signed)
Speech therapist from [facility] request a new modifed swallowing study, she feels he may be silently aspirating, could you please order this? She would like this done ASAP. Kathy at [phone]

## 2018-03-24 NOTE — Telephone Encounter (Signed)
Juliann Pulse is looking an epic order for [facility] home [facility] [phone]

## 2018-03-24 NOTE — Telephone Encounter (Signed)
I did ordered barium swallow studies.

## 2018-03-24 NOTE — Telephone Encounter (Signed)
Informed [name].

## 2018-03-25 ENCOUNTER — Other Ambulatory Visit (HOSPITAL_COMMUNITY): Payer: Self-pay | Admitting: Internal Medicine

## 2018-03-25 DIAGNOSIS — R1313 Dysphagia, pharyngeal phase: Secondary | ICD-10-CM | POA: Diagnosis not present

## 2018-03-25 DIAGNOSIS — G43009 Migraine without aura, not intractable, without status migrainosus: Secondary | ICD-10-CM | POA: Diagnosis not present

## 2018-03-25 DIAGNOSIS — K7290 Hepatic failure, unspecified without coma: Secondary | ICD-10-CM | POA: Diagnosis not present

## 2018-03-25 DIAGNOSIS — E785 Hyperlipidemia, unspecified: Secondary | ICD-10-CM | POA: Diagnosis not present

## 2018-03-25 DIAGNOSIS — R590 Localized enlarged lymph nodes: Secondary | ICD-10-CM | POA: Diagnosis not present

## 2018-03-25 DIAGNOSIS — I10 Essential (primary) hypertension: Secondary | ICD-10-CM | POA: Diagnosis not present

## 2018-03-25 DIAGNOSIS — G4733 Obstructive sleep apnea (adult) (pediatric): Secondary | ICD-10-CM | POA: Diagnosis not present

## 2018-03-25 DIAGNOSIS — E441 Mild protein-calorie malnutrition: Secondary | ICD-10-CM | POA: Diagnosis not present

## 2018-03-25 DIAGNOSIS — J449 Chronic obstructive pulmonary disease, unspecified: Secondary | ICD-10-CM | POA: Diagnosis not present

## 2018-03-25 DIAGNOSIS — M810 Age-related osteoporosis without current pathological fracture: Secondary | ICD-10-CM | POA: Diagnosis not present

## 2018-03-25 DIAGNOSIS — R1310 Dysphagia, unspecified: Secondary | ICD-10-CM

## 2018-03-25 DIAGNOSIS — J9611 Chronic respiratory failure with hypoxia: Secondary | ICD-10-CM | POA: Diagnosis not present

## 2018-03-27 DIAGNOSIS — G43009 Migraine without aura, not intractable, without status migrainosus: Secondary | ICD-10-CM | POA: Diagnosis not present

## 2018-03-27 DIAGNOSIS — E441 Mild protein-calorie malnutrition: Secondary | ICD-10-CM | POA: Diagnosis not present

## 2018-03-27 DIAGNOSIS — R1313 Dysphagia, pharyngeal phase: Secondary | ICD-10-CM | POA: Diagnosis not present

## 2018-03-27 DIAGNOSIS — G4733 Obstructive sleep apnea (adult) (pediatric): Secondary | ICD-10-CM | POA: Diagnosis not present

## 2018-03-27 DIAGNOSIS — I10 Essential (primary) hypertension: Secondary | ICD-10-CM | POA: Diagnosis not present

## 2018-03-27 DIAGNOSIS — M810 Age-related osteoporosis without current pathological fracture: Secondary | ICD-10-CM | POA: Diagnosis not present

## 2018-03-27 DIAGNOSIS — R590 Localized enlarged lymph nodes: Secondary | ICD-10-CM | POA: Diagnosis not present

## 2018-03-27 DIAGNOSIS — J449 Chronic obstructive pulmonary disease, unspecified: Secondary | ICD-10-CM | POA: Diagnosis not present

## 2018-03-27 DIAGNOSIS — K7290 Hepatic failure, unspecified without coma: Secondary | ICD-10-CM | POA: Diagnosis not present

## 2018-03-27 DIAGNOSIS — E785 Hyperlipidemia, unspecified: Secondary | ICD-10-CM | POA: Diagnosis not present

## 2018-03-27 DIAGNOSIS — J9611 Chronic respiratory failure with hypoxia: Secondary | ICD-10-CM | POA: Diagnosis not present

## 2018-04-01 ENCOUNTER — Ambulatory Visit (INDEPENDENT_AMBULATORY_CARE_PROVIDER_SITE_OTHER): Payer: Medicare Other | Admitting: Internal Medicine

## 2018-04-01 ENCOUNTER — Ambulatory Visit (HOSPITAL_COMMUNITY)
Admission: RE | Admit: 2018-04-01 | Discharge: 2018-04-01 | Disposition: A | Discharge status: Home / Self Care | Payer: Medicare Other | Source: Ambulatory Visit | Attending: Internal Medicine | Admitting: Internal Medicine

## 2018-04-01 ENCOUNTER — Encounter: Payer: Self-pay | Admitting: Internal Medicine

## 2018-04-01 ENCOUNTER — Other Ambulatory Visit: Payer: Self-pay | Admitting: Internal Medicine

## 2018-04-01 VITALS — BP 112/72 | HR 82 | Ht 67.5 in | Wt 157.4 lb

## 2018-04-01 DIAGNOSIS — R1310 Dysphagia, unspecified: Secondary | ICD-10-CM

## 2018-04-01 DIAGNOSIS — J441 Chronic obstructive pulmonary disease with (acute) exacerbation: Secondary | ICD-10-CM | POA: Diagnosis not present

## 2018-04-01 DIAGNOSIS — A318 Other mycobacterial infections: Secondary | ICD-10-CM

## 2018-04-01 DIAGNOSIS — J9611 Chronic respiratory failure with hypoxia: Secondary | ICD-10-CM | POA: Diagnosis not present

## 2018-04-01 DIAGNOSIS — Z8739 Personal history of other diseases of the musculoskeletal system and connective tissue: Secondary | ICD-10-CM

## 2018-04-01 DIAGNOSIS — J849 Interstitial pulmonary disease, unspecified: Secondary | ICD-10-CM | POA: Diagnosis not present

## 2018-04-01 DIAGNOSIS — A319 Mycobacterial infection, unspecified: Secondary | ICD-10-CM

## 2018-04-01 DIAGNOSIS — J449 Chronic obstructive pulmonary disease, unspecified: Secondary | ICD-10-CM | POA: Diagnosis not present

## 2018-04-01 DIAGNOSIS — J439 Emphysema, unspecified: Secondary | ICD-10-CM | POA: Diagnosis not present

## 2018-04-01 MED ORDER — PREDNISONE 10 MG PO TABS: ORAL_TABLET | ORAL | 0 refills | Status: DC

## 2018-04-01 MED ORDER — DOXYCYCLINE HYCLATE 100 MG PO TABS: 100.0000 mg | ORAL_TABLET | Freq: Two times a day (BID) | ORAL | 0 refills | Status: DC

## 2018-04-01 MED ORDER — BUDESONIDE-FORMOTEROL FUMARATE 80-4.5 MCG/ACT IN AERO: 2.0000 | INHALATION_SPRAY | Freq: Two times a day (BID) | RESPIRATORY_TRACT | 12 refills | Status: AC

## 2018-04-01 NOTE — Progress Notes (Signed)
Subjective:     Patient ID: [name], male   DOB: [birth year], 72 y.o.   MRN: [id number]  HPI   PCP Lorella Nimrod, MD   HPI  OV 02/04/2018  Chief Complaint  Patient presents with  . Consult    Referred by Dr. Reesa Chew due to possible ILD.  Pt has had a cough since he became sick in January 2019. Also has c/o SOB, pain in his left side. DME: [facility], 2L O2.  hx from patient, chart review and ILD questionnaire   72 year old male known at baseline to have COPD NOS (even as of 2012 but never on o2 till Jan 2019) . Referred to the interstitial lung disease clinic.  It is a very complex history since January 2019 when his problem started.  I had a very difficult time piecing together all parts of his history because of multiple admissions in January 2019 and again April 2019 and a transfer to [facility] in January 2019.    But as best as I can get the patient tells me that all his problems started only in January 2019.  He was admitted Oct 14 2017 with abd pain due to cholangitis . 3 days later Oct 17, 2017 when he was consulted for acute hypoxemic respiratory failure with pulmonary infiltrates that was initially considered aspiration/ARDS v cholangitis sepsis ALI. Followup Pulm consult considered Chronic  HP based on CT chest findings.  He did have liver injury and jaundice and high fevers. However, patient tells me that he never aspirated (though at [facility] later in Jan 2019 he had failed swallow).  Discharged Oct 22, 2017 to [facility].   It seems at that point he had significant jaundice and the [facility] notes indicate he had sepsis secondary to ascending cholangitis and hyperbilirubinemia.  When he was transferred to [facility] in January 2019 he was on 14 L nasal cannula.  He was intubated for ease ERCP.  At some point it is been determined that he needs a cholecystectomy which he is yet to have.  Also  Jan 2019 sputum culture was positive for AFB which is negative for MTB and  and negative for MAI at time of discharge from [facility] and as of [place] note 11/11/17. Currently shows he  Grew Mycobacterium abseccuss - rapid grower . Key evaluation at [facility] in Jan 2019  is indicated below.  In particular with terms of autoimmune profile he did have a positive P-ANCA level at [facility] in January 2019.  However this might have been negative at [facility]  His sedimentation rate was extremely high at 94, IgE 678 and  never been on prednisone to treat this according to his history.    At the time of discharge from [facility] in January 2019/February 2019 [apparently spent 1-1/2 months there] he was discharged on 3-1/2 L at rest with 7 L exertion.  Of note he is also been reported to have emphysema on the CT chest  At this point in time he continues to do better.  He is between 2 and 3 L of oxygen with rest and exertion.  Early April 2019 he had another admission at [facility] for atypical chest pain.  CT angiogram ruled out PE.  But I personally visualized the scan it shows bilateral upper lobe diffuse groundglass opacities.  The official report is that it is somewhat better compared to January 2019.  There is upper lobe predominance to this.  Review of [facility] [place] ILD questionnaire  Symptoms: He has had cough since January 2019 associated with sputum production.  It is roughly the same since onset.Marland Kitchen  He also has dyspnea since the last 5 months.  He believes it slowly getting worse but at the same time he is on less oxygen and he also says he is getting better.  In terms of severity he is a level 5 out of 5 while doing laundry and a level 4 out of 5 while shopping on making the bed with oxygen in a level 3 work for showering or shaving.  On a level 3 walking on a level at his own pace.  His shortness of breath limits him significantly  Review of systems: He reports fatigue arthralgia, dysphagia, weight loss for the last few  to several months, some snoring but this is been going on for decades  Past medical history: For COPD positive he says that he has had rheumatoid arthritis since 2001 but denies other autoimmune diseases.  He also has sleep apnea for the last few to several decades  Family history of lung disease: Essentially negative for pulmonary fibrosis COPD asthma sarcoid cystic fibrosis hypersensitivity pneumonitis or autoimmune disease  Exposure history: He smoked from 1959 till 2019.  1 pack a day.  He quit in January 2019 after he got ill.  He is lived in the same house.  The house is 72 years old.  He is lived there for 5 months.  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  He was in the greenhouse all the time.  There was significant amount of soil and mold and damp woodchips and tobacco leaves and mulch which was moldy.  Therefore has been exposed to mold he plans and also the damp water system in the greenhouse is been exposed to sawdust and multigrain.  He is also mixed herbicides  Pulmonary toxicity history: He denies   January 2019 labs Hepatitis viral panel: Negative HIV: Negative CMV: Negative EBV: Indicative of past infection Legionella: Negative Blood and sputum cultures: No growth to date Sputum AFB: mycobacterim abscessus Sputum cytology: Negative Respiratory viral panel: Negative GI pathogen panel: Positive only for EPEC Aspergillus and fungal markers:negativ x 3 ANA: Negative ANCA: p-anca positve 1:160 Anti-mitochondrial antibody: Positive at 29.1 Anti-smooth muscle antibody: Pending IgE: Elevated at 678 Porphobilinogen: Pending ACE level: Normal Inflammatory markers: Ferritin 1100, CRP 26, pro calcitonin 0.84 Results for [name], [name] (MRN [id number]) as of 02/04/2018 15:08  Ref. Range 10/18/2017 02:07 10/19/2017 09:07 10/20/2017 03:10 10/21/2017 14:11  12/30/2017 15:00  Anit Nuclear Antibody(ANA) Latest Ref Range: Negative    Negative    Angiotensin-Converting Enzyme Latest Ref Range: 14 - 82 U/L 49      Mitochondrial Ab Latest Ref Range: 0.0 - 20.0 Units     21.3 (H)  Mitochondrial M2 Ab, IgG Latest Ref Range: 0.0 - 20.0 Units  29.1 (H)     Cytoplasmic (C-ANCA) Latest Ref Range: Neg:<1:20 titer    <1:20   P-ANCA Latest Ref Range: Neg:<1:20 titer    1:160 (H)   Atypical P-ANCA titer Latest Ref Range: Neg:<1:20 titer    <1:20     PFT 01/07/18   Results for [name], [name] (MRN [id number]) as of 02/04/2018 15:08  Ref. Range 01/07/2018 10:46  FVC-Pre Latest Units: L 2.39  FVC-%Pred-Pre Latest Units: %  61  FEV1-Pre Latest Units: L 1.58  FEV1-%Pred-Pre Latest Units: % 56  Pre FEV1/FVC ratio Latest Units: % 66  Results for [name], [name] (MRN [id number]) as of 02/04/2018 15:08  Ref. Range 01/07/2018 10:46  DLCO unc Latest Units: ml/min/mmHg 5.98  DLCO unc % pred Latest Units: % 21   CT chest January 07, 2018 that I personally visualized and agree with the findings  IMPRESSION: 1. No evidence of pulmonary embolism. 2. Patchy consolidation throughout both lower lobes, largely new since most recent chest CT of 10/17/2017. Mild patchy consolidation in the dependent upper lobes is mildly increased. Findings are most worrisome for multilobar pneumonia/aspiration. 3. Underlying patchy ground-glass opacities in both lungs, decreased from prior chest CT. Patchy reticulation in both lungs, nonspecific, difficult to exclude underlying interstitial lung disease. Follow-up high-resolution chest CT may be obtained in 3-6 months as clinically warranted. 4. Mild right paratracheal lymphadenopathy is new and nonspecific, more likely reactive. 5. Moderate emphysema with mild diffuse bronchial wall thickening, suggesting COPD.  Emphysema (ICD10-J43.9).   Electronically Signed   By: Ilona Sorrel M.D.   On: 01/07/2018 18:49   OV  04/01/2018  Chief Complaint  Patient presents with  . Follow-up    HRCT performed 5/10.  Pt has ID appt scheduled 6/3. Pt states things have been up and down since last visit. Pt has had some worsening with his breathing that began 2 days ago to where sats would not get out of the 80s. Pt also has a cough with clear mucus. Denies any CP.  follow-up chronic hypoxemic respiratory failure complicated situation. After last visit we deem him to have low stage II COPD with emphysema associated with interstitial lung disease with a background history of rheumatoid arthritis. He had pulmonary infiltrates and gender 2019 that seemed to be improving by spring 2019. He had grown atypical mycobacterium and I referred him to infectious diseases.  At this point in time he presents for follow-up he says compared to January 2019 overall his health status is much better. Compared to 2 months ago he somewhat better. His physical conditioning is improved. He continues to be stable on 3 L nasal oxygen. His hypoxemia has not improved any further. He did see infectious diseases Dr. Graylon Good 03/10/2018 was admitted that his atypical mycobacterium warrants treatment but will be difficult to treat. She is retesting his sputum for mycobacteria and wondered pulmonary input. He has had another CT scan radiologist thinks he is associated UIP. His autoimmune profile shows several trace antibodies thus raising the specter of pulmonary fibrosis with autoimmune features but he also has history of previous rheumatoid arthritis. To me the images look a little bit better than January 2019 on my personal visualization and looks similar to April 2019.  New issues that a few days ago he started having unexplained worsening shortness of breath and hypoxemia transient. There is no associated increase in cough or sputum production although he does have some amount of white-yellow sputum. This no associated increase in wheezing. It has been heart in  the environment recently with high humidity.there is no chest pain or orthopnea paroxysmal nocturnal dyspnea or hemoptysis.    Results for [name], [name] (MRN [id number]) as of 04/01/2018 12:21  Ref. Range 10/21/2017 14:11 12/30/2017 15:00 02/04/2018 16:24  Anti Nuclear Antibody(ANA) Latest Ref Range: NEGATIVE    POSITIVE (A)  ANA Pattern 1 Unknown   NUCLEAR ENVELOPE (A)  ANA Titer 1 Latest Units: titer   1:640 (H)  Angiotensin-Converting Enzyme  Latest Ref Range: 9 - 67 U/L   55  Cyclic Citrullin Peptide Ab Latest Units: UNITS   <16  ds DNA Ab Latest Units: IU/mL   1  Mitochondrial Ab Latest Ref Range: 0.0 - 20.0 Units  21.3 (H)   Myeloperoxidase Abs Latest Units: AI   <1.0  Serine Protease 3 Latest Units: AI   <1.0  RA Latex Turbid. Latest Ref Range: <14 IU/mL   27 (H)  Cytoplasmic (C-ANCA) Latest Ref Range: Neg:<1:20 titer <1:20    P-ANCA Latest Ref Range: Neg:<1:20 titer 1:160 (H)    Atypical P-ANCA titer Latest Ref Range: Neg:<1:20 titer <1:20     IMPRESSION: 1. Today's study demonstrates progressive interstitial lung disease with a typical usual interstitial pneumonia (UIP) CT pattern. 2. Aortic atherosclerosis, in addition to two vessel coronary artery disease. Assessment for potential risk factor modification, dietary therapy or pharmacologic therapy may be warranted, if clinically indicated. 3. Mild diffuse bronchial wall thickening with mild centrilobular and paraseptal emphysema. 4. 3 mm nonobstructive calculus in the upper pole collecting system of left kidney.  Aortic Atherosclerosis (ICD10-I70.0) and Emphysema (ICD10-J43.9).   Electronically Signed   By: Vinnie Langton M.D.   On: 02/14/2018 12:59   has a past medical history of Anxiety and depression, Arthritis, Asthma, Cholangitis due to bile duct calculus with obstruction (10/2017), COPD (chronic obstructive pulmonary disease) ([place]), GERD (gastroesophageal reflux disease), HTN (hypertension), Hyperlipemia,  Migraine, Personal history of alcoholism ([place]), Pneumonia, Sleep apnea, Tobacco use, and Tubular adenoma (01/29/2008).   reports that he quit smoking about 5 months ago. His smoking use included cigarettes. He has a 27.50 pack-year smoking history. He has never used smokeless tobacco.  Past Surgical History:  Procedure Laterality Date  . ANTERIOR FUSION CERVICAL SPINE  2005   two surgeries after two mechanical falls, per Dr. Donald Pore  . COLONOSCOPY W/ BIOPSIES  01/29/2008   Dr. Silvano Rusk  . ESOPHAGOGASTRODUODENOSCOPY N/A 10/15/2017   Procedure: ESOPHAGOGASTRODUODENOSCOPY (EGD);  Surgeon: Irene Shipper, MD;  Location: [facility] ENDOSCOPY;  Service: Endoscopy;  Laterality: N/A;  . [place]   correction for crossed-eyes  . HEMORRHOID SURGERY  1971    No Known Allergies  Immunization History  Administered Date(s) Administered  . Influenza, Seasonal, Injecte, Preservative Fre 07/22/2012  . Influenza,inj,Quad PF,6+ Mos 08/23/2014, 07/20/2016, 06/27/2017  . Influenza-Unspecified 08/06/2017  . Pneumococcal Conjugate-13 07/12/2017  . Pneumococcal Polysaccharide-23 12/02/2012  . Tdap 06/23/2011, 04/20/2016    Family History  Problem Relation Age of Onset  . Heart attack Mother   . Heart attack Father   . Stroke Sister   . Heart attack Brother   . Pancreatic cancer Brother   . Heart attack Sister   . Breast cancer Other        niece     Current Outpatient Medications:  .  acetaminophen (TYLENOL) 325 MG tablet, Take 2 tablets (650 mg total) by mouth every 6 (six) hours as needed for mild pain or moderate pain., Disp: 60 tablet, Rfl: 0 .  albuterol (PROAIR HFA) 108 (90 Base) MCG/ACT inhaler, INHALE 2 PUFFS INTO THE LUNGS FOUR TIMES DAILY, Disp: 8.5 g, Rfl: 6 .  aspirin EC 81 MG tablet, Take 1 tablet (81 mg total) by mouth daily., Disp: 150 tablet, Rfl: 2 .  atorvastatin (LIPITOR) 20 MG tablet, Take 1 tablet (20 mg total) by mouth daily. (Patient taking differently: Take 20 mg by mouth  at bedtime. ), Disp: 30 tablet, Rfl: 3 .  cyclobenzaprine (FLEXERIL) 10  MG tablet, Take 10 mg by mouth as needed for muscle spasms., Disp: , Rfl:  .  doxazosin (CARDURA) 1 MG tablet, TAKE 1 TABLET(1 MG) BY MOUTH AT BEDTIME, Disp: 90 tablet, Rfl: 2 .  furosemide (LASIX) 20 MG tablet, Take 20 mg by mouth daily., Disp: , Rfl:  .  gabapentin (NEURONTIN) 300 MG capsule, Take 1 capsule (300 mg total) by mouth 2 (two) times daily., Disp: 60 capsule, Rfl: 3 .  HYDROcodone-acetaminophen (NORCO/VICODIN) 5-325 MG tablet, Take 1 tablet by mouth 2 (two) times daily as needed (for pain)., Disp: , Rfl:  .  mirtazapine (REMERON) 15 MG tablet, Take 1 tablet (15 mg total) by mouth at bedtime., Disp: 30 tablet, Rfl: 2 .  Multiple Vitamins-Minerals (CENTRUM SILVER ADULT 50+) TABS, Take 1 tablet by mouth daily., Disp: , Rfl:  .  OXYGEN, Inhale 3-7 L/min into the lungs continuous. , Disp: , Rfl:  .  polyethylene glycol (MIRALAX / GLYCOLAX) packet, Take 17 g by mouth daily as needed for mild constipation. , Disp: , Rfl:  .  tiotropium (SPIRIVA) 18 MCG inhalation capsule, Place 1 capsule (18 mcg total) into inhaler and inhale daily. (Patient taking differently: Place 18 mcg into inhaler and inhale at bedtime. ), Disp: 30 capsule, Rfl: 2 .  nitroGLYCERIN (NITROSTAT) 0.4 MG SL tablet, Place 1 tablet (0.4 mg total) under the tongue every 5 (five) minutes as needed for chest pain. x3 doses (Patient not taking: Reported on 04/01/2018), Disp: 10 tablet, Rfl: 2  Review of Systems     Objective:   Physical Exam  Constitutional: He is oriented to person, place, and time. He appears well-developed and well-nourished. No distress.  Lean Looks better  HENT:  Head: Normocephalic and atraumatic.  Right Ear: External ear normal.  Left Ear: External ear normal.  Mouth/Throat: Oropharynx is clear and moist. No oropharyngeal exudate.  Eyes: Pupils are equal, round, and reactive to light. Conjunctivae and EOM are normal. Right eye  exhibits no discharge. Left eye exhibits no discharge. No scleral icterus.  Neck: Normal range of motion. Neck supple. No JVD present. No tracheal deviation present. No thyromegaly present.  Cardiovascular: Normal rate, regular rhythm and intact distal pulses. Exam reveals no gallop and no friction rub.  No murmur heard. Pulmonary/Chest: Effort normal and breath sounds normal. No respiratory distress. He has no wheezes. He has no rales. He exhibits no tenderness.  Scattered crackles pverall AE dimihsed Somewhat barrell chest  Abdominal: Soft. Bowel sounds are normal. He exhibits no distension and no mass. There is no tenderness. There is no rebound and no guarding.  Musculoskeletal: Normal range of motion. He exhibits no edema or tenderness.  Lymphadenopathy:    He has no cervical adenopathy.  Neurological: He is alert and oriented to person, place, and time. He has normal reflexes. No cranial nerve deficit. Coordination normal.  Skin: Skin is warm and dry. No rash noted. He is not diaphoretic. No erythema. No pallor.  Psychiatric: He has a normal mood and affect. His behavior is normal. Judgment and thought content normal.  Nursing note and vitals reviewed.  Vitals:   04/01/18 1204  BP: 112/72  Pulse: 82  SpO2: 98%  Weight: 157 lb 6.4 oz (71.4 kg)  Height: 5' 7.5" (1.715 m)        Assessment:       ICD-10-CM   1. Chronic hypoxemic respiratory failure (HCC) J96.11   2. Pulmonary emphysema, unspecified emphysema type ([place]) J43.9   3. Stage 2 moderate  COPD by GOLD classification ([place]) J44.9   4. COPD with acute exacerbation ([place]) J44.1   5. ILD (interstitial lung disease) ([place]) J84.9   6. History of rheumatoid arthritis Z87.39   7. 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        Plan:     Chronic hypoxemic respiratory failure ([place]) Pulmonary emphysema, unspecified emphysema type (HCC) Stage 2 moderate COPD by GOLD classification ([place]) COPD with acute  exacerbation (HCC)  - Take doxycycline 100mg  po twice daily x 5 days; take after meals and avoid sunlight -Please take prednisone 40 mg x1 day, then 30 mg x1 day, then 20 mg x1 day, then 10 mg x1 day, and then 5 mg x1 day and stop - continue 3 LNC o2  - continue spiriva  - start low dose symbicort 2 puff twice daily   ILD (interstitial lung disease) (HCC) History of rheumatoid arthritis   - at this point this would require treatment with immune modulators that can be too strong in setting of below infection and overall health - I do not recommend that  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 - I could bronchoscopy with lavage to help re-identify this organism but too risky given high o2 need - rest per Dr Baxter Flattery in ILD   Overall  - supporitive care + COPD management only   Follolwup - 3 months or sooner; CAT score at followu   Dr. Brand Males, M.D., [facility].P Pulmonary and Critical Care Medicine Staff Physician, [place] Director - Interstitial Lung Disease  Program  [facility] [place] [facility] [place], [place], [address]  Pager: [phone], If no answer or between  15:00h - 7:00h: call [phone] Telephone: [phone]

## 2018-04-01 NOTE — Patient Instructions (Addendum)
ICD-10-CM   1. Chronic hypoxemic respiratory failure (HCC) J96.11   2. Pulmonary emphysema, unspecified emphysema type ([place]) J43.9   3. Stage 2 moderate COPD by GOLD classification ([place]) J44.9   4. COPD with acute exacerbation ([place]) J44.1   5. ILD (interstitial lung disease) ([place]) J84.9   6. History of rheumatoid arthritis Z87.39   7. 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    Chronic hypoxemic respiratory failure ([place]) Pulmonary emphysema, unspecified emphysema type (HCC) Stage 2 moderate COPD by GOLD classification ([place]) COPD with acute exacerbation (HCC)  - Take doxycycline 100mg  po twice daily x 5 days; take after meals and avoid sunlight -Please take prednisone 40 mg x1 day, then 30 mg x1 day, then 20 mg x1 day, then 10 mg x1 day, and then 5 mg x1 day and stop - continue 3 LNC o2  - continue spiriva  - start low dose symbicort 2 puff twice daily   ILD (interstitial lung disease) (HCC) History of rheumatoid arthritis   - at this point this would require treatment with immune modulators that can be too strong in setting of below infection and overall health - I do not recommend that  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 - I could bronchoscopy with lavage to help re-identify this organism but too risky given high o2 need - rest per Dr Baxter Flattery in ILD   Overall  - supporitive care + COPD management only   Follolwup - 3 months or sooner; CAT score at followup

## 2018-04-02 DIAGNOSIS — G43009 Migraine without aura, not intractable, without status migrainosus: Secondary | ICD-10-CM | POA: Diagnosis not present

## 2018-04-02 DIAGNOSIS — G4733 Obstructive sleep apnea (adult) (pediatric): Secondary | ICD-10-CM | POA: Diagnosis not present

## 2018-04-02 DIAGNOSIS — J9611 Chronic respiratory failure with hypoxia: Secondary | ICD-10-CM | POA: Diagnosis not present

## 2018-04-02 DIAGNOSIS — R1313 Dysphagia, pharyngeal phase: Secondary | ICD-10-CM | POA: Diagnosis not present

## 2018-04-02 DIAGNOSIS — M810 Age-related osteoporosis without current pathological fracture: Secondary | ICD-10-CM | POA: Diagnosis not present

## 2018-04-02 DIAGNOSIS — R590 Localized enlarged lymph nodes: Secondary | ICD-10-CM | POA: Diagnosis not present

## 2018-04-02 DIAGNOSIS — J449 Chronic obstructive pulmonary disease, unspecified: Secondary | ICD-10-CM | POA: Diagnosis not present

## 2018-04-02 DIAGNOSIS — E441 Mild protein-calorie malnutrition: Secondary | ICD-10-CM | POA: Diagnosis not present

## 2018-04-02 DIAGNOSIS — E785 Hyperlipidemia, unspecified: Secondary | ICD-10-CM | POA: Diagnosis not present

## 2018-04-02 DIAGNOSIS — K7290 Hepatic failure, unspecified without coma: Secondary | ICD-10-CM | POA: Diagnosis not present

## 2018-04-02 DIAGNOSIS — I10 Essential (primary) hypertension: Secondary | ICD-10-CM | POA: Diagnosis not present

## 2018-04-09 ENCOUNTER — Other Ambulatory Visit: Payer: Self-pay | Admitting: Internal Medicine

## 2018-04-09 ENCOUNTER — Ambulatory Visit (INDEPENDENT_AMBULATORY_CARE_PROVIDER_SITE_OTHER): Payer: Medicare Other | Admitting: Internal Medicine

## 2018-04-09 ENCOUNTER — Telehealth: Payer: Self-pay | Admitting: Internal Medicine

## 2018-04-09 VITALS — Wt 164.0 lb

## 2018-04-09 DIAGNOSIS — J849 Interstitial pulmonary disease, unspecified: Secondary | ICD-10-CM | POA: Diagnosis not present

## 2018-04-09 NOTE — Telephone Encounter (Signed)
Received a refill request for doxy 100 and also pred taper.  Called and spoke with pt to see if he was having any symptoms.  Pt stated he was not having any symptoms to the reason why he would need a refill of the doxy and pred taper.  I stated to pt if he started to have some symptoms of increased SOB, cough with discoloration in mucus or any other symptoms that would require him to have an abx and pred taper, to call our office then and we could send a Rx to his pharmacy at that time.  Pt expressed understanding. Nothing further needed.

## 2018-04-09 NOTE — Progress Notes (Signed)
RFV: follow up for pulmonary infection-mycobacteria mucogenicum  Patient ID: [name], male   DOB: 1945/11/01, 72 y.o.   MRN: [id number]  HPI 72yo M with M with history of IUP. Sent in sputum on 6/12 still pending. Uses accapella valve 4 x per day has clear sputum production. Overall doing about the same as he was at last visit.  Outpatient Encounter Medications as of 04/09/2018  Medication Sig  . acetaminophen (TYLENOL) 325 MG tablet Take 2 tablets (650 mg total) by mouth every 6 (six) hours as needed for mild pain or moderate pain.  Marland Kitchen albuterol (PROAIR HFA) 108 (90 Base) MCG/ACT inhaler INHALE 2 PUFFS INTO THE LUNGS FOUR TIMES DAILY  . aspirin EC 81 MG tablet Take 1 tablet (81 mg total) by mouth daily.  Marland Kitchen atorvastatin (LIPITOR) 20 MG tablet Take 1 tablet (20 mg total) by mouth daily. (Patient not taking: Reported on 04/09/2018)  . budesonide-formoterol (SYMBICORT) 80-4.5 MCG/ACT inhaler Inhale 2 puffs into the lungs 2 (two) times daily.  . cyclobenzaprine (FLEXERIL) 10 MG tablet Take 10 mg by mouth as needed for muscle spasms.  Marland Kitchen doxazosin (CARDURA) 1 MG tablet TAKE 1 TABLET(1 MG) BY MOUTH AT BEDTIME  . doxycycline (VIBRA-TABS) 100 MG tablet Take 1 tablet (100 mg total) by mouth 2 (two) times daily.  . furosemide (LASIX) 20 MG tablet Take 20 mg by mouth daily.  Marland Kitchen gabapentin (NEURONTIN) 300 MG capsule TAKE 1 CAPSULE(300 MG) BY MOUTH TWICE DAILY  . HYDROcodone-acetaminophen (NORCO/VICODIN) 5-325 MG tablet Take 1 tablet by mouth 2 (two) times daily as needed (for pain).  . mirtazapine (REMERON) 15 MG tablet Take 1 tablet (15 mg total) by mouth at bedtime.  . Multiple Vitamins-Minerals (CENTRUM SILVER ADULT 50+) TABS Take 1 tablet by mouth daily.  . nitroGLYCERIN (NITROSTAT) 0.4 MG SL tablet Place 1 tablet (0.4 mg total) under the tongue every 5 (five) minutes as needed for chest pain. x3 doses (Patient not taking: Reported on 04/01/2018)  . OXYGEN Inhale 3-7 L/min into the lungs  continuous.   . polyethylene glycol (MIRALAX / GLYCOLAX) packet Take 17 g by mouth daily as needed for mild constipation.   . predniSONE (DELTASONE) 10 MG tablet Take 40 mg x1 day then take 30 mg x1 day then take 20 mg x1 day then take 10 mg x1 day then take 5 mg x1 day then stop  . tiotropium (SPIRIVA) 18 MCG inhalation capsule Place 1 capsule (18 mcg total) into inhaler and inhale daily. (Patient taking differently: Place 18 mcg into inhaler and inhale at bedtime. )   No facility-administered encounter medications on file as of 04/09/2018.      Patient Active Problem List   Diagnosis Date Noted  . Nonintractable headache 03/14/2018  . Dysphagia 01/13/2018  . Silent aspiration 01/13/2018  . Cholelithiasis without obstruction 01/13/2018  . Chronic hypoxemic respiratory failure ([place]) 01/08/2018  . Atypical chest pain 01/08/2018  . Right carotid bruit 11/19/2017  . Malnutrition of moderate degree 10/22/2017  . Ground glass opacity present on imaging of lung   . Thrombocytopenia ([place]) 10/16/2017  . Gastroparesis   . Osteoporosis 06/27/2017  . OSA (obstructive sleep apnea) 08/23/2014  . Hx of migraines 05/26/2014  . Back pain, chronic 05/26/2014  . History of colonic polyps 12/29/2012  . Hyperlipemia 11/12/2012  . COPD (chronic obstructive pulmonary disease) ([place]) 11/12/2012  . HTN (hypertension) 11/12/2012  . Tobacco use 11/12/2012     Health Maintenance Due  Topic Date Due  .  COLONOSCOPY  01/28/2018     Review of Systems Productive cough but not excessive shortness of breath Physical Exam   Wt 164 lb (74.4 kg)   BMI 25.31 kg/m   Physical Exam  Constitutional: He is oriented to person, place, and time. He appears well-developed and well-nourished. No distress.  HENT:  Mouth/Throat: Oropharynx is clear and moist. No oropharyngeal exudate.  Cardiovascular: Normal rate, regular rhythm and normal heart sounds. Exam reveals no gallop and no friction rub.  No murmur heard.    Pulmonary/Chest: Effort normal and breath sounds normal. No respiratory distress. He has no wheezes.  Abdominal: Soft. Bowel sounds are normal. He exhibits no distension. There is no tenderness.  Lymphadenopathy:  He has no cervical adenopathy.  Neurological: He is alert and oriented to person, place, and time.  Skin: Skin is warm and dry. No rash noted. No erythema.  Psychiatric: He has a normal mood and affect. His behavior is normal.    Lab Results  Component Value Date   LABRPR NON REAC 01/28/2009    CBC Lab Results  Component Value Date   WBC 11.3 (H) 02/05/2018   RBC 4.41 02/05/2018   HGB 12.9 (L) 02/05/2018   HCT 39.3 02/05/2018   PLT 248 02/05/2018   MCV 89.1 02/05/2018   MCH 29.3 02/05/2018   MCHC 32.8 02/05/2018   RDW 14.6 02/05/2018   LYMPHSABS 1.7 01/07/2018   MONOABS 0.7 01/07/2018   EOSABS 0.2 01/07/2018    BMET Lab Results  Component Value Date   NA 137 02/05/2018   K 3.3 (L) 02/05/2018   CL 102 02/05/2018   CO2 26 02/05/2018   GLUCOSE 104 (H) 02/05/2018   BUN 6 02/05/2018   CREATININE 0.80 02/05/2018   CALCIUM 8.9 02/05/2018   GFRNONAA >60 02/05/2018   GFRAA >60 02/05/2018      Assessment and Plan  Possible pulmonary mycobacterial infection = had isolated mucogenicum in January but only once. Want to see if still isolating has repeat culture pending. Still waiting on cx See back in 6 wk to see how he is feeling and decide if need to treat  Interstitial lung disease= continue with doing pulmonary hygiene routine

## 2018-04-12 ENCOUNTER — Other Ambulatory Visit: Payer: Self-pay | Admitting: Internal Medicine

## 2018-04-12 DIAGNOSIS — M4856XD Collapsed vertebra, not elsewhere classified, lumbar region, subsequent encounter for fracture with routine healing: Secondary | ICD-10-CM | POA: Diagnosis not present

## 2018-04-12 DIAGNOSIS — J449 Chronic obstructive pulmonary disease, unspecified: Secondary | ICD-10-CM | POA: Diagnosis not present

## 2018-04-12 DIAGNOSIS — R092 Respiratory arrest: Secondary | ICD-10-CM | POA: Diagnosis not present

## 2018-04-12 DIAGNOSIS — J849 Interstitial pulmonary disease, unspecified: Secondary | ICD-10-CM | POA: Diagnosis not present

## 2018-04-12 DIAGNOSIS — J9611 Chronic respiratory failure with hypoxia: Secondary | ICD-10-CM | POA: Diagnosis not present

## 2018-04-14 ENCOUNTER — Other Ambulatory Visit: Payer: Self-pay

## 2018-04-14 ENCOUNTER — Ambulatory Visit (INDEPENDENT_AMBULATORY_CARE_PROVIDER_SITE_OTHER): Payer: Medicare Other | Admitting: Internal Medicine

## 2018-04-14 ENCOUNTER — Encounter: Payer: Self-pay | Admitting: Internal Medicine

## 2018-04-14 VITALS — BP 122/79 | HR 101 | Temp 98.2°F | Ht 67.5 in | Wt 159.9 lb

## 2018-04-14 DIAGNOSIS — J9611 Chronic respiratory failure with hypoxia: Secondary | ICD-10-CM | POA: Diagnosis not present

## 2018-04-14 DIAGNOSIS — T17900S Unspecified foreign body in respiratory tract, part unspecified causing asphyxiation, sequela: Secondary | ICD-10-CM

## 2018-04-14 DIAGNOSIS — J849 Interstitial pulmonary disease, unspecified: Secondary | ICD-10-CM | POA: Diagnosis not present

## 2018-04-14 DIAGNOSIS — Z9981 Dependence on supplemental oxygen: Secondary | ICD-10-CM

## 2018-04-14 DIAGNOSIS — Z6824 Body mass index (BMI) 24.0-24.9, adult: Secondary | ICD-10-CM

## 2018-04-14 DIAGNOSIS — J449 Chronic obstructive pulmonary disease, unspecified: Secondary | ICD-10-CM

## 2018-04-14 DIAGNOSIS — E440 Moderate protein-calorie malnutrition: Secondary | ICD-10-CM | POA: Diagnosis not present

## 2018-04-14 DIAGNOSIS — Z7951 Long term (current) use of inhaled steroids: Secondary | ICD-10-CM

## 2018-04-14 DIAGNOSIS — I10 Essential (primary) hypertension: Secondary | ICD-10-CM

## 2018-04-14 DIAGNOSIS — Z736 Limitation of activities due to disability: Secondary | ICD-10-CM

## 2018-04-14 DIAGNOSIS — Z79899 Other long term (current) drug therapy: Secondary | ICD-10-CM

## 2018-04-14 MED ORDER — ALBUTEROL SULFATE HFA 108 (90 BASE) MCG/ACT IN AERS: INHALATION_SPRAY | RESPIRATORY_TRACT | 6 refills | Status: AC

## 2018-04-14 NOTE — Assessment & Plan Note (Signed)
Thin barium swallow studies shows low risk for aspiration. Patient was instructed for techniques to prevent any aspiration with no food or liquid restriction.

## 2018-04-14 NOTE — Assessment & Plan Note (Signed)
Patient follow-up with pulmonary. Will get benefit with immune modulation therapy once cleared from Mycobacterium  Infection. Continued to experience intermittent cough with clear sputum which is unchanged. He was recently given a short course of doxycycline and prednisone for 5 days which he completed with no benefit by his pulmonologist.  He was instructed to continue Spiriva and Symbicort.

## 2018-04-14 NOTE — Assessment & Plan Note (Addendum)
BP Readings from Last 3 Encounters:  04/14/18 122/79  04/01/18 112/72  03/14/18 126/73   He was normotensive today.  Continue current management with Cardura and Lasix. We will recheck BMP today, as last check was in May which shows mild hypokalemia.  Patient takes Lasix 20 mg daily and is not taking any potassium supplement.

## 2018-04-14 NOTE — Assessment & Plan Note (Signed)
Patient continue to show improvement in his weight and endurance.

## 2018-04-14 NOTE — Patient Instructions (Signed)
Thank you for visiting clinic today. I am glad that you are doing well, we hope that you will keep going in that direction. We are just checking your blood to see your potassium levels-I will call you if they are low, as lasix can make your potassium little low. Keep monitoring your oxygen level as now you are moving around without any oxygen. Continue follow-up with infectious disease and your lung doctor. Continue increasing your activities. Please follow-up in 48-month, or earlier if needed.

## 2018-04-14 NOTE — Assessment & Plan Note (Signed)
Patient showed tremendous improvement from respiratory standpoint. Was able to ambulate well with out oxygen, lowest oxygen recorded was 91% with ambulation and 97% at rest.  Patient continued to follow-up with pulmonary for his interstitial lung disease. He was thought to be not a good candidate for immune modulation therapy because of a possible Mycobacterium infection. He also follow-up with ID for this presumed Mycobacterium infection-another culture were drawn via bronchoscopy-pending results.  Up to this point there is no growth. Patient will follow up with ID next month for the final results.  Patient continued to show improvement in his endurance and was able to walk up to his mail  walks without becoming short of breath.  He is planning to increase his walk with a goal of 1 mile twice a day.  He was encouraged to continue increasing his endurance. Should keep checking his oxygen level and use oxygen as needed for saturation below 90%. He will continue using Spiriva and Symbicort. He will use albuterol as needed-prescription was provided.

## 2018-04-14 NOTE — Progress Notes (Signed)
   CC: For follow-up of his hypertension and chronic lung disease.  HPI:  [name] is a 72 y.o. with past medical history as listed below came to the clinic for follow-up of his hypertension and chronic respiratory failure requiring oxygen therapy.  Patient has a recent history of multiple complicated hospital admissions causing respiratory failure and severe deconditioning requiring home oxygen and limited mobility.  Patient has no complaints today, feeling better, patient came without his walker and oxygen today.  Saturating well in mid 90s.  According to patient he is doing very well and was able to wean him off from oxygen most of the time, continue to use it as needed and at nighttime.  He was able to walk without any assistance.  According to patient he has a very long driveway and now he is being able to walk to and back from his mailbox without being short of breath.  He is trying to eat healthy, and keep trying increasing his endurance. He is compliant with his medications.  Please see assessment and plan for his current chronic conditions.  Past Medical History:  Diagnosis Date  . Anxiety and depression   . Arthritis   . Asthma   . Cholangitis due to bile duct calculus with obstruction 10/2017  . COPD (chronic obstructive pulmonary disease) ([place])   . GERD (gastroesophageal reflux disease)   . HTN (hypertension)   . Hyperlipemia   . Migraine   . Personal history of alcoholism ([place])   . Pneumonia   . Sleep apnea   . Tobacco use   . Tubular adenoma 01/29/2008   Review of Systems: Negative except mentioned in HPI.  Physical Exam:  Vitals:   04/14/18 1322  BP: 122/79  Pulse: (!) 101  Temp: 98.2 F (36.8 C)  TempSrc: Oral  SpO2: 96%  Weight: 159 lb 14.4 oz (72.5 kg)  Height: 5' 7.5" (1.715 m)    General: Vital signs reviewed.  Patient is well-developed and well-nourished, in no acute distress and cooperative with exam.  Head: Normocephalic and  atraumatic. Eyes: EOMI, conjunctivae normal, no scleral icterus.  Neck: Supple, trachea midline, normal ROM, no JVD, masses, thyromegaly, or carotid bruit present.  Cardiovascular: RRR, S1 normal, S2 normal, no murmurs, gallops, or rubs. Pulmonary/Chest: Clear to auscultation bilaterally, few basal coarse breath sounds. Abdominal: Soft, non-tender, non-distended, BS +, no masses, organomegaly, or guarding present.  Extremities: No lower extremity edema bilaterally,  pulses symmetric and intact bilaterally. No cyanosis or clubbing. Neurological: A&O x3, Strength is normal and symmetric bilaterally, cranial nerve II-XII are grossly intact, no focal motor deficit, sensory intact to light touch bilaterally.  Skin: Warm, dry and intact. No rashes or erythema. Psychiatric: Normal mood and affect. speech and behavior is normal. Cognition and memory are normal.  Assessment & Plan:   See Encounters Tab for problem based charting.  Patient seen with Dr. Dareen Piano.

## 2018-04-15 LAB — BMP8+ANION GAP
ANION GAP: 14 mmol/L (ref 10.0–18.0)
BUN / CREAT RATIO: 9 — AB (ref 10–24)
BUN: 9 mg/dL (ref 8–27)
CO2: 25 mmol/L (ref 20–29)
CREATININE: 1.03 mg/dL (ref 0.76–1.27)
Calcium: 9.5 mg/dL (ref 8.6–10.2)
Chloride: 102 mmol/L (ref 96–106)
GFR calc Af Amer: 84 mL/min/{1.73_m2} (ref >59)
GFR calc non Af Amer: 72 mL/min/{1.73_m2} (ref >59)
GLUCOSE: 90 mg/dL (ref 65–99)
POTASSIUM: 4.8 mmol/L (ref 3.5–5.2)
SODIUM: 141 mmol/L (ref 134–144)

## 2018-04-16 NOTE — Progress Notes (Signed)
Internal Medicine Clinic Attending  I saw and evaluated the patient.  I personally confirmed the key portions of the history and exam documented by Dr. Amin and I reviewed pertinent patient test results.  The assessment, diagnosis, and plan were formulated together and I agree with the documentation in the resident's note. 

## 2018-05-01 ENCOUNTER — Other Ambulatory Visit: Payer: Self-pay | Admitting: Internal Medicine

## 2018-05-01 DIAGNOSIS — E785 Hyperlipidemia, unspecified: Secondary | ICD-10-CM

## 2018-05-05 LAB — MYCOBACTERIA,CULT W/FLUOROCHROME SMEAR
MICRO NUMBER:: 90705142
MICRO NUMBER:: 90715656
SMEAR:: NONE SEEN
SMEAR:: NONE SEEN
SPECIMEN QUALITY: ADEQUATE
SPECIMEN QUALITY:: ADEQUATE

## 2018-05-13 DIAGNOSIS — M4856XD Collapsed vertebra, not elsewhere classified, lumbar region, subsequent encounter for fracture with routine healing: Secondary | ICD-10-CM | POA: Diagnosis not present

## 2018-05-13 DIAGNOSIS — J9611 Chronic respiratory failure with hypoxia: Secondary | ICD-10-CM | POA: Diagnosis not present

## 2018-05-13 DIAGNOSIS — J849 Interstitial pulmonary disease, unspecified: Secondary | ICD-10-CM | POA: Diagnosis not present

## 2018-05-13 DIAGNOSIS — R092 Respiratory arrest: Secondary | ICD-10-CM | POA: Diagnosis not present

## 2018-05-13 DIAGNOSIS — J449 Chronic obstructive pulmonary disease, unspecified: Secondary | ICD-10-CM | POA: Diagnosis not present

## 2018-05-21 ENCOUNTER — Encounter: Payer: Self-pay | Admitting: Internal Medicine

## 2018-05-21 ENCOUNTER — Ambulatory Visit (INDEPENDENT_AMBULATORY_CARE_PROVIDER_SITE_OTHER): Payer: Medicare Other | Admitting: Internal Medicine

## 2018-05-21 VITALS — BP 116/78 | HR 90 | Temp 98.2°F | Wt 164.8 lb

## 2018-05-21 DIAGNOSIS — J849 Interstitial pulmonary disease, unspecified: Secondary | ICD-10-CM

## 2018-05-21 NOTE — Progress Notes (Signed)
RFV: follow up for  Possible ntm pulmonary infectoin  Patient ID: [name], male   DOB: July 02, 1946, 72 y.o.   MRN: [id number]  HPI Mr grounds hx of having m.mucogenicum isolated on sputum cx in January 2019 in setting of pneumonia. Has been doing well. Only wearing his oxygen in the night. Not having significant productive cough. afb cx from 6/12 have been NGTD  Outpatient Encounter Medications as of 05/21/2018  Medication Sig  . acetaminophen (TYLENOL) 325 MG tablet Take 2 tablets (650 mg total) by mouth every 6 (six) hours as needed for mild pain or moderate pain.  Marland Kitchen albuterol (PROAIR HFA) 108 (90 Base) MCG/ACT inhaler INHALE 2 PUFFS INTO THE LUNGS FOUR TIMES DAILY  . aspirin EC 81 MG tablet Take 1 tablet (81 mg total) by mouth daily.  Marland Kitchen atorvastatin (LIPITOR) 20 MG tablet TAKE 1 TABLET(20 MG) BY MOUTH DAILY  . budesonide-formoterol (SYMBICORT) 80-4.5 MCG/ACT inhaler Inhale 2 puffs into the lungs 2 (two) times daily.  Marland Kitchen doxazosin (CARDURA) 1 MG tablet TAKE 1 TABLET(1 MG) BY MOUTH AT BEDTIME  . doxycycline (VIBRA-TABS) 100 MG tablet Take 1 tablet (100 mg total) by mouth 2 (two) times daily.  . furosemide (LASIX) 20 MG tablet Take 20 mg by mouth daily.  Marland Kitchen gabapentin (NEURONTIN) 300 MG capsule TAKE 1 CAPSULE(300 MG) BY MOUTH TWICE DAILY  . mirtazapine (REMERON) 15 MG tablet TAKE 1 TABLET(15 MG) BY MOUTH AT BEDTIME  . Multiple Vitamins-Minerals (CENTRUM SILVER ADULT 50+) TABS Take 1 tablet by mouth daily.  . nitroGLYCERIN (NITROSTAT) 0.4 MG SL tablet Place 1 tablet (0.4 mg total) under the tongue every 5 (five) minutes as needed for chest pain. x3 doses  . OXYGEN Inhale 3-7 L/min into the lungs continuous.   . polyethylene glycol (MIRALAX / GLYCOLAX) packet Take 17 g by mouth daily as needed for mild constipation.   . predniSONE (DELTASONE) 10 MG tablet Take 40 mg x1 day then take 30 mg x1 day then take 20 mg x1 day then take 10 mg x1 day then take 5 mg x1 day then stop  . tiotropium  (SPIRIVA) 18 MCG inhalation capsule Place 1 capsule (18 mcg total) into inhaler and inhale daily.  . [DISCONTINUED] HYDROcodone-acetaminophen (NORCO/VICODIN) 5-325 MG tablet Take 1 tablet by mouth 2 (two) times daily as needed (for pain).   No facility-administered encounter medications on file as of 05/21/2018.      Patient Active Problem List   Diagnosis Date Noted  . Nonintractable headache 03/14/2018  . Dysphagia 01/13/2018  . Silent aspiration 01/13/2018  . Cholelithiasis without obstruction 01/13/2018  . Chronic hypoxemic respiratory failure ([place]) 01/08/2018  . Right carotid bruit 11/19/2017  . Malnutrition of moderate degree 10/22/2017  . Interstitial lung disease ([place])   . Thrombocytopenia ([place]) 10/16/2017  . Gastroparesis   . Osteoporosis 06/27/2017  . OSA (obstructive sleep apnea) 08/23/2014  . Hx of migraines 05/26/2014  . Back pain, chronic 05/26/2014  . History of colonic polyps 12/29/2012  . Hyperlipemia 11/12/2012  . COPD (chronic obstructive pulmonary disease) ([place]) 11/12/2012  . HTN (hypertension) 11/12/2012  . Tobacco use 11/12/2012     Health Maintenance Due  Topic Date Due  . COLONOSCOPY  01/28/2018  . INFLUENZA VACCINE  05/08/2018     Review of Systems Occasional productive cough and dyspnea on exertion. 12 point ros is otherwise negative Physical Exam   BP 116/78   Pulse 90   Temp 98.2 F (36.8 C)   Wt 164 lb  12.8 oz (74.8 kg)   BMI 25.43 kg/m    Constitutional: He is oriented to person, place, and time. He appears well-developed and well-nourished. No distress.  HENT:  Mouth/Throat: Oropharynx is clear and moist. No oropharyngeal exudate.  Cardiovascular: Normal rate, regular rhythm and normal heart sounds. Exam reveals no gallop and no friction rub.  No murmur heard.  Pulmonary/Chest: Effort normal and breath sounds normal. No respiratory distress. He has no wheezes.  Abdominal: Soft. Bowel sounds are normal. He exhibits no distension.  There is no tenderness.  Lymphadenopathy:  He has no cervical adenopathy.  Neurological: He is alert and oriented to person, place, and time.  Skin: Skin is warm and dry. No rash noted. No erythema.  Psychiatric: He has a normal mood and affect. His behavior is normal.    Lab Results  Component Value Date   LABRPR NON REAC 01/28/2009    CBC Lab Results  Component Value Date   WBC 11.3 (H) 02/05/2018   RBC 4.41 02/05/2018   HGB 12.9 (L) 02/05/2018   HCT 39.3 02/05/2018   PLT 248 02/05/2018   MCV 89.1 02/05/2018   MCH 29.3 02/05/2018   MCHC 32.8 02/05/2018   RDW 14.6 02/05/2018   LYMPHSABS 1.7 01/07/2018   MONOABS 0.7 01/07/2018   EOSABS 0.2 01/07/2018    BMET Lab Results  Component Value Date   NA 141 04/14/2018   K 4.8 04/14/2018   CL 102 04/14/2018   CO2 25 04/14/2018   GLUCOSE 90 04/14/2018   BUN 9 04/14/2018   CREATININE 1.03 04/14/2018   CALCIUM 9.5 04/14/2018   GFRNONAA 72 04/14/2018   GFRAA 84 04/14/2018      Assessment and Plan - will continue to monitor for symptoms. At this time, he has not had further NTM re-isolated. Will not need treatment at this time.   Interstitial lung disease= continue with inhalers,pulmonary hygiene  rtc in 3 months

## 2018-05-31 ENCOUNTER — Other Ambulatory Visit: Payer: Self-pay | Admitting: Internal Medicine

## 2018-05-31 DIAGNOSIS — J449 Chronic obstructive pulmonary disease, unspecified: Secondary | ICD-10-CM

## 2018-06-03 ENCOUNTER — Encounter: Payer: Self-pay | Admitting: *Deleted

## 2018-06-13 DIAGNOSIS — R092 Respiratory arrest: Secondary | ICD-10-CM | POA: Diagnosis not present

## 2018-06-13 DIAGNOSIS — J449 Chronic obstructive pulmonary disease, unspecified: Secondary | ICD-10-CM | POA: Diagnosis not present

## 2018-06-13 DIAGNOSIS — J9611 Chronic respiratory failure with hypoxia: Secondary | ICD-10-CM | POA: Diagnosis not present

## 2018-06-13 DIAGNOSIS — J849 Interstitial pulmonary disease, unspecified: Secondary | ICD-10-CM | POA: Diagnosis not present

## 2018-07-02 ENCOUNTER — Other Ambulatory Visit: Payer: Medicare Other

## 2018-07-02 ENCOUNTER — Ambulatory Visit (INDEPENDENT_AMBULATORY_CARE_PROVIDER_SITE_OTHER): Payer: Medicare Other | Admitting: Internal Medicine

## 2018-07-02 ENCOUNTER — Encounter: Payer: Self-pay | Admitting: Internal Medicine

## 2018-07-02 ENCOUNTER — Other Ambulatory Visit: Payer: Self-pay | Admitting: *Deleted

## 2018-07-02 VITALS — BP 120/80 | HR 84 | Ht 67.5 in | Wt 166.8 lb

## 2018-07-02 DIAGNOSIS — M7989 Other specified soft tissue disorders: Secondary | ICD-10-CM | POA: Diagnosis not present

## 2018-07-02 DIAGNOSIS — Z8739 Personal history of other diseases of the musculoskeletal system and connective tissue: Secondary | ICD-10-CM

## 2018-07-02 DIAGNOSIS — J9611 Chronic respiratory failure with hypoxia: Secondary | ICD-10-CM

## 2018-07-02 DIAGNOSIS — J849 Interstitial pulmonary disease, unspecified: Secondary | ICD-10-CM | POA: Diagnosis not present

## 2018-07-02 DIAGNOSIS — Z77120 Contact with and (suspected) exposure to mold (toxic): Secondary | ICD-10-CM

## 2018-07-02 DIAGNOSIS — J439 Emphysema, unspecified: Secondary | ICD-10-CM

## 2018-07-02 DIAGNOSIS — A318 Other mycobacterial infections: Secondary | ICD-10-CM

## 2018-07-02 DIAGNOSIS — A319 Mycobacterial infection, unspecified: Secondary | ICD-10-CM

## 2018-07-02 NOTE — Progress Notes (Signed)
HPI   PCP Lorella Nimrod, MD   HPI  OV 02/04/2018  Chief Complaint  Patient presents with  . Consult    Referred by Dr. Reesa Chew due to possible ILD.  Pt has had a cough since he became sick in January 2019. Also has c/o SOB, pain in his left side. DME: [facility], 2L O2.  hx from patient, chart review and ILD questionnaire   72 year old male known at baseline to have COPD NOS (even as of 2012 but never on o2 till Jan 2019) . Referred to the interstitial lung disease clinic.  It is a very complex history since January 2019 when his problem started.  I had a very difficult time piecing together all parts of his history because of multiple admissions in January 2019 and again April 2019 and a transfer to [facility] in January 2019.    But as best as I can get the patient tells me that all his problems started only in January 2019.  He was admitted Oct 14 2017 with abd pain due to cholangitis . 3 days later Oct 17, 2017 when he was consulted for acute hypoxemic respiratory failure with pulmonary infiltrates that was initially considered aspiration/ARDS v cholangitis sepsis ALI. Followup Pulm consult considered Chronic  HP based on CT chest findings.  He did have liver injury and jaundice and high fevers. However, patient tells me that he never aspirated (though at [facility] later in Jan 2019 he had failed swallow).  Discharged Oct 22, 2017 to [facility].   It seems at that point he had significant jaundice and the [facility] notes indicate he had sepsis secondary to ascending cholangitis and hyperbilirubinemia.  When he was transferred to [facility] in January 2019 he was on 14 L nasal cannula.  He was intubated for ease ERCP.  At some point it is been determined that he needs a cholecystectomy which he is yet to have.  Also  Jan 2019 sputum culture was positive for AFB which is negative for MTB and and negative for MAI at time of discharge from [facility] and as of [place] note  11/11/17. Currently shows he  Grew Mycobacterium abseccuss - rapid grower . Key evaluation at [facility] in Jan 2019  is indicated below.  In particular with terms of autoimmune profile he did have a positive P-ANCA level at [facility] in January 2019.  However this might have been negative at [facility].  His sedimentation rate was extremely high at 94, IgE 678 and  never been on prednisone to treat this according to his history.    At the time of discharge from [facility] in January 2019/February 2019 [apparently spent 1-1/2 months there] he was discharged on 3-1/2 L at rest with 7 L exertion.  Of note he is also been reported to have emphysema on the CT chest  At this point in time he continues to do better.  He is between 2 and 3 L of oxygen with rest and exertion.  Early April 2019 he had another admission at [facility] for atypical chest pain.  CT angiogram ruled out PE.  But I personally visualized the scan it shows bilateral upper lobe diffuse groundglass opacities.  The official report is that it is somewhat better compared to January 2019.  There is upper lobe predominance to this.   Review of Comprehensive [place] ILD questionnaire  Symptoms: He has had cough since January 2019 associated with sputum  production.  It is roughly the same since onset.Marland Kitchen  He also has dyspnea since the last 5 months.  He believes it slowly getting worse but at the same time he is on less oxygen and he also says he is getting better.  In terms of severity he is a level 5 out of 5 while doing laundry and a level 4 out of 5 while shopping on making the bed with oxygen in a level 3 work for showering or shaving.  On a level 3 walking on a level at his own pace.  His shortness of breath limits him significantly  Review of systems: He reports fatigue arthralgia, dysphagia, weight loss for the last few to several months, some snoring but this is been going on for decades  Past medical history:  For COPD positive he says that he has had rheumatoid arthritis since 2001 but denies other autoimmune diseases.  He also has sleep apnea for the last few to several decades  Family history of lung disease: Essentially negative for pulmonary fibrosis COPD asthma sarcoid cystic fibrosis hypersensitivity pneumonitis or autoimmune disease  Exposure history: He smoked from 1959 till 2019.  1 pack a day.  He quit in January 2019 after he got ill.  He is lived in the same house.  The house is 72 years old.  He is lived there for 5 months.  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  He was in the greenhouse all the time.  There was significant amount of soil and mold and damp woodchips and tobacco leaves and mulch which was moldy.  Therefore has been exposed to mold he plans and also the damp water system in the greenhouse is been exposed to sawdust and multigrain.  He is also mixed herbicides  Pulmonary toxicity history: He denies   January 2019 labs Hepatitis viral panel: Negative HIV: Negative CMV: Negative EBV: Indicative of past infection Legionella: Negative Blood and sputum cultures: No growth to date Sputum AFB: mycobacterim abscessus Sputum cytology: Negative Respiratory viral panel: Negative GI pathogen panel: Positive only for EPEC Aspergillus and fungal markers:negativ x 3 ANA: Negative ANCA: p-anca positve 1:160 Anti-mitochondrial antibody: Positive at 29.1 Anti-smooth muscle antibody: Pending IgE: Elevated at 678 Porphobilinogen: Pending ACE level: Normal Inflammatory markers: Ferritin 1100, CRP 26, pro calcitonin 0.84 Results for CAP, MASSI (MRN [id number]) as of 02/04/2018 15:08  CT chest January 07, 2018 that I personally visualized and agree with the findings  IMPRESSION: 1. No evidence of pulmonary embolism. 2. Patchy consolidation throughout  both lower lobes, largely new since most recent chest CT of 10/17/2017. Mild patchy consolidation in the dependent upper lobes is mildly increased. Findings are most worrisome for multilobar pneumonia/aspiration. 3. Underlying patchy ground-glass opacities in both lungs, decreased from prior chest CT. Patchy reticulation in both lungs, nonspecific, difficult to exclude underlying interstitial lung disease. Follow-up high-resolution chest CT may be obtained in 3-6 months as clinically warranted. 4. Mild right paratracheal lymphadenopathy is new and nonspecific, more likely reactive. 5. Moderate emphysema with mild diffuse bronchial wall thickening, suggesting COPD.  Emphysema (ICD10-J43.9).   Electronically Signed   By: Ilona Sorrel M.D.   On: 01/07/2018 18:49   OV 04/01/2018  Chief Complaint  Patient presents with  . Follow-up    HRCT performed 5/10.  Pt has ID appt scheduled 6/3. Pt states  things have been up and down since last visit. Pt has had some worsening with his breathing that began 2 days ago to where sats would not get out of the 80s. Pt also has a cough with clear mucus. Denies any CP.  follow-up chronic hypoxemic respiratory failure complicated situation. After last visit we deem him to have low stage II COPD with emphysema associated with interstitial lung disease with a background history of rheumatoid arthritis. He had pulmonary infiltrates and gender 2019 that seemed to be improving by spring 2019. He had grown atypical mycobacterium and I referred him to infectious diseases.  At this point in time he presents for follow-up he says compared to January 2019 overall his health status is much better. Compared to 2 months ago he somewhat better. His physical conditioning is improved. He continues to be stable on 3 L nasal oxygen. His hypoxemia has not improved any further. He did see infectious diseases Dr. Graylon Good 03/10/2018 was admitted that his atypical mycobacterium  warrants treatment but will be difficult to treat. She is retesting his sputum for mycobacteria and wondered pulmonary input. He has had another CT scan radiologist thinks he is associated UIP. His autoimmune profile shows several trace antibodies thus raising the specter of pulmonary fibrosis with autoimmune features but he also has history of previous rheumatoid arthritis. To me the images look a little bit better than January 2019 on my personal visualization and looks similar to April 2019.  New issues that a few days ago he started having unexplained worsening shortness of breath and hypoxemia transient. There is no associated increase in cough or sputum production although he does have some amount of white-yellow sputum. This no associated increase in wheezing. It has been heart in the environment recently with high humidity.there is no chest pain or orthopnea paroxysmal nocturnal dyspnea or hemoptysis.    Results for [name], [name] (MRN [id number]) as of 04/01/2018 12:21  Ref. Range 10/21/2017 14:11 12/30/2017 15:00 02/04/2018 16:24  Anti Nuclear Antibody(ANA) Latest Ref Range: NEGATIVE    POSITIVE (A)  ANA Pattern 1 Unknown   NUCLEAR ENVELOPE (A)  ANA Titer 1 Latest Units: titer   1:640 (H)  Angiotensin-Converting Enzyme Latest Ref Range: 9 - 67 U/L   55  Cyclic Citrullin Peptide Ab Latest Units: UNITS   <16  ds DNA Ab Latest Units: IU/mL   1  Mitochondrial Ab Latest Ref Range: 0.0 - 20.0 Units  21.3 (H)   Myeloperoxidase Abs Latest Units: AI   <1.0  Serine Protease 3 Latest Units: AI   <1.0  RA Latex Turbid. Latest Ref Range: <14 IU/mL   27 (H)  Cytoplasmic (C-ANCA) Latest Ref Range: Neg:<1:20 titer <1:20    P-ANCA Latest Ref Range: Neg:<1:20 titer 1:160 (H)    Atypical P-ANCA titer Latest Ref Range: Neg:<1:20 titer <1:20     IMPRESSION: 1. Today's study demonstrates progressive interstitial lung disease with a typical usual interstitial pneumonia (UIP) CT pattern. 2. Aortic  atherosclerosis, in addition to two vessel coronary artery disease. Assessment for potential risk factor modification, dietary therapy or pharmacologic therapy may be warranted, if clinically indicated. 3. Mild diffuse bronchial wall thickening with mild centrilobular and paraseptal emphysema. 4. 3 mm nonobstructive calculus in the upper pole collecting system of left kidney.  Aortic Atherosclerosis (ICD10-I70.0) and Emphysema (ICD10-J43.9).   Electronically Signed   By: Vinnie Langton M.D.   On: 02/14/2018 12:59   OV 07/02/2018  Subjective:  Patient ID: [name], male ,  DOB: January 30, 1946 , age 57 y.o. , MRN: [id number] , ADDRESS: [address] [place] [address]   07/02/2018 -   Chief Complaint  Patient presents with  . Follow-up    Pt has been having more problems with his breathing and has also been coughing.  States he also has had some CP.     HPI JAHMERE BRAMEL 72 y.o. -has emphysema mixed with ILD.  The basis of ILD is not fully clear because of a previous history of rheumatoid arthritis but 2019 autoimmune panel being positive for several different other antibodies and also a history of strong mold exposure and admission for acute on chronic hypoxemic respiratory failure with worsening infiltrates in /2019.  Grew Mycobacterium for which she is on observation therapy   I have not seen him in several months.  In the interim he has visited ID clinic where they placed him on observation therapy.  He has seen Dr. Graylon Good.  I personally reviewed that note.  He tells me that his oxygen requirements have improved based on subjective assessment.  He only uses oxygen at night and rarely in the daytime.  He is feeling better but he still has significant amount of cough and shortness of breath.  COPD CAT score is 20.  He has gained weight and energy.  The only new she is having is that the last few weeks he has had swelling of the left lower calf.  There is no redness.   There is no injury.  It has not affected his breathing.    CAT COPD Symptom & Quality of Life Score (GSK trademark) 0 is no burden. 5 is highest burden 07/02/2018   Never Cough -> Cough all the time 3  No phlegm in chest -> Chest is full of phlegm 3  No chest tightness -> Chest feels very tight 0  No dyspnea for 1 flight stairs/hill -> Very dyspneic for 1 flight of stairs 4  No limitations for ADL at home -> Very limited with ADL at home 3  Confident leaving home -> Not at all confident leaving home 0  Sleep soundly -> Do not sleep soundly because of lung condition 3  Lots of Energy -> No energy at all 4  TOTAL Score (max 40)  20         Simple office walk 185 feet x  3 laps goal with forehead probe 07/02/2018   O2 used Room air  Number laps completed 3  Comments about pace Normal/fst  Resting Pulse Ox/HR 99% and 75/min  Final Pulse Ox/HR 94% and 113/min  Desaturated </= 88% no  Desaturated <= 3% points yes  Got Tachycardic >/= 90/min yes  Symptoms at end of test Mild dyspnea  Miscellaneous comments beter      ROS - per HPI     has a past medical history of Anxiety and depression, Arthritis, Asthma, Cholangitis due to bile duct calculus with obstruction (10/2017), COPD (chronic obstructive pulmonary disease) ([place]), GERD (gastroesophageal reflux disease), HTN (hypertension), Hyperlipemia, Migraine, Personal history of alcoholism ([place]), Pneumonia, Sleep apnea, Tobacco use, and Tubular adenoma (01/29/2008).   reports that he quit smoking about 8 months ago. His smoking use included cigarettes. He has a 27.50 pack-year smoking history. He has never used smokeless tobacco.  Past Surgical History:  Procedure Laterality Date  . ANTERIOR FUSION CERVICAL SPINE  2005   two surgeries after two mechanical falls, per Dr. Donald Pore  . COLONOSCOPY W/ BIOPSIES  01/29/2008  Dr. Silvano Rusk  . ESOPHAGOGASTRODUODENOSCOPY N/A 10/15/2017   Procedure: ESOPHAGOGASTRODUODENOSCOPY (EGD);   Surgeon: Irene Shipper, MD;  Location: [facility] ENDOSCOPY;  Service: Endoscopy;  Laterality: N/A;  . [place]   correction for crossed-eyes  . HEMORRHOID SURGERY  1971    No Known Allergies  Immunization History  Administered Date(s) Administered  . Influenza, Seasonal, Injecte, Preservative Fre 07/22/2012  . Influenza,inj,Quad PF,6+ Mos 08/23/2014, 07/20/2016, 06/27/2017  . Influenza-Unspecified 08/06/2017  . Pneumococcal Conjugate-13 07/12/2017  . Pneumococcal Polysaccharide-23 12/02/2012  . Tdap 06/23/2011, 04/20/2016    Family History  Problem Relation Age of Onset  . Heart attack Mother   . Heart attack Father   . Stroke Sister   . Heart attack Brother   . Pancreatic cancer Brother   . Heart attack Sister   . Breast cancer Other        niece     Current Outpatient Medications:  .  acetaminophen (TYLENOL) 325 MG tablet, Take 2 tablets (650 mg total) by mouth every 6 (six) hours as needed for mild pain or moderate pain., Disp: 60 tablet, Rfl: 0 .  albuterol (PROAIR HFA) 108 (90 Base) MCG/ACT inhaler, INHALE 2 PUFFS INTO THE LUNGS FOUR TIMES DAILY, Disp: 8.5 g, Rfl: 6 .  aspirin EC 81 MG tablet, Take 1 tablet (81 mg total) by mouth daily., Disp: 150 tablet, Rfl: 2 .  atorvastatin (LIPITOR) 20 MG tablet, TAKE 1 TABLET(20 MG) BY MOUTH DAILY, Disp: 90 tablet, Rfl: 2 .  budesonide-formoterol (SYMBICORT) 80-4.5 MCG/ACT inhaler, Inhale 2 puffs into the lungs 2 (two) times daily., Disp: 1 Inhaler, Rfl: 12 .  doxazosin (CARDURA) 1 MG tablet, TAKE 1 TABLET(1 MG) BY MOUTH AT BEDTIME, Disp: 90 tablet, Rfl: 2 .  furosemide (LASIX) 20 MG tablet, Take 20 mg by mouth daily., Disp: , Rfl:  .  gabapentin (NEURONTIN) 300 MG capsule, TAKE 1 CAPSULE(300 MG) BY MOUTH TWICE DAILY, Disp: 60 capsule, Rfl: 0 .  mirtazapine (REMERON) 15 MG tablet, TAKE 1 TABLET(15 MG) BY MOUTH AT BEDTIME, Disp: 30 tablet, Rfl: 0 .  Multiple Vitamins-Minerals (CENTRUM SILVER ADULT 50+) TABS, Take 1 tablet by mouth  daily., Disp: , Rfl:  .  OXYGEN, Inhale 3-7 L/min into the lungs continuous. , Disp: , Rfl:  .  polyethylene glycol (MIRALAX / GLYCOLAX) packet, Take 17 g by mouth daily as needed for mild constipation. , Disp: , Rfl:  .  SPIRIVA HANDIHALER 18 MCG inhalation capsule, INHALE CONTENTS OF 1 CAPSULE ONCE DAILY USING HANDIHALER, Disp: 30 capsule, Rfl: 1 .  nitroGLYCERIN (NITROSTAT) 0.4 MG SL tablet, Place 1 tablet (0.4 mg total) under the tongue every 5 (five) minutes as needed for chest pain. x3 doses (Patient not taking: Reported on 07/02/2018), Disp: 10 tablet, Rfl: 2      Objective:   Vitals:   07/02/18 1133  BP: 120/80  Pulse: 84  SpO2: 97%  Weight: 166 lb 12.8 oz (75.7 kg)  Height: 5' 7.5" (1.715 m)    Estimated body mass index is 25.74 kg/m as calculated from the following:   Height as of this encounter: 5' 7.5" (1.715 m).   Weight as of this encounter: 166 lb 12.8 oz (75.7 kg).  @WEIGHTCHANGE @  [employer]   07/02/18 1133  Weight: 166 lb 12.8 oz (75.7 kg)     Physical Exam  General Appearance:    Alert, cooperative, no distress, appears stated age - older , sitting on - chair , Deconditioned looking - yes  but better  Head:    Normocephalic, without obvious abnormality, atraumatic  Eyes:    PERRL, conjunctiva/corneas clear,  Ears:    Normal TM's and external ear canals, both ears  Nose:   Nares normal, septum midline, mucosa normal, no drainage    or sinus tenderness. OXYGEN ON  - no . Patient is @ ra   Throat:   Lips, mucosa, and tongue normal; teeth and gums normal. Cyanosis on lips - no  Neck:   Supple, symmetrical, trachea midline, no adenopathy;    thyroid:  no enlargement/tenderness/nodules; no carotid   bruit or JVD  Back:     Symmetric, no curvature, ROM normal, no CVA tenderness  Lungs:     Distress - no , Wheeze no, Barrell Chest - no, Purse lip breathing - no, Crackles - scattered faint +   Chest Wall:    No tenderness or deformity. Scars in chest no    Heart:    Regular rate and rhythm, S1 and S2 normal, no rub   or gallop, Murmur - no  Breast Exam:    NOT DONE  Abdomen:     Soft, non-tender, bowel sounds active all four quadrants,    no masses, no organomegaly. Visceral obesity - mild yes  Genitalia:   NOT DONE  Rectal:   NOT DONE  Extremities:   Extremities normal, atraumatic, Clubbing - no, Edema - no  Pulses:   2+ and symmetric all extremities  Skin:   Stigmata of Connective Tissue Disease - no  Lymph nodes:   Cervical, supraclavicular, and axillary nodes normal  Psychiatric:  Neurologic:   yes - pleasant   CAm-ICU - neg, Alert and Oriented x 3 - yes, Moves all 4s - yes, Speech - normal, Cognition - intact           Assessment:       ICD-10-CM   1. Swelling of left lower extremity M79.89   2. Pulmonary emphysema, unspecified emphysema type ([place]) J43.9   3. ILD (interstitial lung disease) ([place]) J84.9   4. Chronic hypoxemic respiratory failure (HCC) J96.11   5. History of rheumatoid arthritis Z87.39   6. Mold exposure Z77.120   7. 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        Plan:     Patient Instructions  Swelling of left lower extremity  - get duplex bilateral lower extrremity 07/02/2018 or 07/03/18 - rule out blood clot  Pulmonary emphysema, unspecified emphysema type ([place]) ILD (interstitial lung disease) ([place]) Chronic hypoxemic respiratory failure (HCC) History of rheumatoid arthritis Mold exposure  -  Need to restage disease - do simple walk test 07/02/2018 - Do spirometry dlco next few weeks to severak weeks - Do HRCT supine prone next few to several weeks - continue o2 at night, spirivan daily, symbicort daily and proair as needed  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  - per ID observation therapy   Followup - next few to several weeks but after CT/PFT - return to ILD clinic  > 50% of this > 25 min visit spent in face to face counseling or coordination  of care - by this undersigned MD - Dr Brand Males. This includes one or more of the following documented above: discussion of test results, diagnostic or treatment recommendations, prognosis, risks and benefits of management options, instructions, education, compliance or risk-factor reduction    SIGNATURE    Dr. Brand Males, M.D., F.C.C.P,  Pulmonary and Critical Care Medicine Staff Physician, [place]  [facility] Director - Interstitial Lung Disease  Program  Pulmonary [place] at [place], [place], [address]  Pager: [phone], If no answer or between  15:00h - 7:00h: call [phone] Telephone: [phone]  12:03 PM 07/02/2018

## 2018-07-02 NOTE — Patient Instructions (Addendum)
Swelling of left lower extremity  - get duplex bilateral lower extrremity 07/02/2018 or 07/03/18 - rule out blood clot  Pulmonary emphysema, unspecified emphysema type (HCC) ILD (interstitial lung disease) ([place]) Chronic hypoxemic respiratory failure (HCC) History of rheumatoid arthritis Mold exposure  -  Need to restage disease - do simple walk test 07/02/2018 - Do spirometry dlco next few weeks to [place] weeks - Do HRCT supine prone next few to several weeks - continue o2 at night, spirivan daily, symbicort daily and proair as needed  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  - per ID observation therapy   Followup - next few to several weeks but after CT/PFT - return to ILD clinic

## 2018-07-03 ENCOUNTER — Encounter (HOSPITAL_COMMUNITY): Payer: Medicare Other

## 2018-07-03 LAB — D-DIMER, QUANTITATIVE: D-Dimer, Quant: 0.36 mcg/mL FEU (ref <0.50)

## 2018-07-10 ENCOUNTER — Ambulatory Visit (HOSPITAL_COMMUNITY)
Admission: RE | Admit: 2018-07-10 | Discharge: 2018-07-10 | Disposition: A | Discharge status: Home / Self Care | Payer: Medicare Other | Source: Ambulatory Visit | Attending: Cardiology | Admitting: Cardiology

## 2018-07-10 DIAGNOSIS — M7989 Other specified soft tissue disorders: Secondary | ICD-10-CM | POA: Insufficient documentation

## 2018-07-13 DIAGNOSIS — J9611 Chronic respiratory failure with hypoxia: Secondary | ICD-10-CM | POA: Diagnosis not present

## 2018-07-13 DIAGNOSIS — J849 Interstitial pulmonary disease, unspecified: Secondary | ICD-10-CM | POA: Diagnosis not present

## 2018-07-13 DIAGNOSIS — J449 Chronic obstructive pulmonary disease, unspecified: Secondary | ICD-10-CM | POA: Diagnosis not present

## 2018-07-13 DIAGNOSIS — R092 Respiratory arrest: Secondary | ICD-10-CM | POA: Diagnosis not present

## 2018-07-14 ENCOUNTER — Encounter: Payer: Medicare Other | Admitting: Internal Medicine

## 2018-07-16 ENCOUNTER — Ambulatory Visit (INDEPENDENT_AMBULATORY_CARE_PROVIDER_SITE_OTHER)
Admission: RE | Admit: 2018-07-16 | Discharge: 2018-07-16 | Disposition: A | Discharge status: Home / Self Care | Payer: Medicare Other | Source: Ambulatory Visit | Attending: Internal Medicine | Admitting: Internal Medicine

## 2018-07-16 DIAGNOSIS — J849 Interstitial pulmonary disease, unspecified: Secondary | ICD-10-CM

## 2018-07-16 DIAGNOSIS — J449 Chronic obstructive pulmonary disease, unspecified: Secondary | ICD-10-CM | POA: Diagnosis not present

## 2018-07-24 ENCOUNTER — Ambulatory Visit: Payer: Medicare Other | Admitting: Internal Medicine

## 2018-08-13 DIAGNOSIS — J9611 Chronic respiratory failure with hypoxia: Secondary | ICD-10-CM | POA: Diagnosis not present

## 2018-08-13 DIAGNOSIS — J449 Chronic obstructive pulmonary disease, unspecified: Secondary | ICD-10-CM | POA: Diagnosis not present

## 2018-08-13 DIAGNOSIS — R092 Respiratory arrest: Secondary | ICD-10-CM | POA: Diagnosis not present

## 2018-08-13 DIAGNOSIS — J849 Interstitial pulmonary disease, unspecified: Secondary | ICD-10-CM | POA: Diagnosis not present

## 2018-08-20 ENCOUNTER — Other Ambulatory Visit: Payer: Self-pay | Admitting: Internal Medicine

## 2018-08-20 ENCOUNTER — Ambulatory Visit: Payer: Medicare Other | Admitting: Internal Medicine

## 2018-08-25 ENCOUNTER — Encounter: Payer: Medicare Other | Admitting: Internal Medicine

## 2018-09-09 ENCOUNTER — Ambulatory Visit: Payer: Medicare Other | Admitting: Internal Medicine

## 2018-09-12 DIAGNOSIS — J449 Chronic obstructive pulmonary disease, unspecified: Secondary | ICD-10-CM | POA: Diagnosis not present

## 2018-09-12 DIAGNOSIS — J849 Interstitial pulmonary disease, unspecified: Secondary | ICD-10-CM | POA: Diagnosis not present

## 2018-09-12 DIAGNOSIS — R092 Respiratory arrest: Secondary | ICD-10-CM | POA: Diagnosis not present

## 2018-09-12 DIAGNOSIS — J9611 Chronic respiratory failure with hypoxia: Secondary | ICD-10-CM | POA: Diagnosis not present

## 2018-09-18 ENCOUNTER — Other Ambulatory Visit: Payer: Self-pay | Admitting: Internal Medicine

## 2018-09-18 DIAGNOSIS — J449 Chronic obstructive pulmonary disease, unspecified: Secondary | ICD-10-CM

## 2018-09-27 ENCOUNTER — Other Ambulatory Visit: Payer: Self-pay | Admitting: Internal Medicine

## 2018-09-29 NOTE — Telephone Encounter (Signed)
Next appt scheduled  12/02/18 with PCP.

## 2018-10-02 ENCOUNTER — Other Ambulatory Visit: Payer: Self-pay | Admitting: Internal Medicine

## 2018-10-03 NOTE — Telephone Encounter (Signed)
Next appt scheduled 12/02/18 with PCP.

## 2018-10-13 DIAGNOSIS — J9611 Chronic respiratory failure with hypoxia: Secondary | ICD-10-CM | POA: Diagnosis not present

## 2018-10-13 DIAGNOSIS — J449 Chronic obstructive pulmonary disease, unspecified: Secondary | ICD-10-CM | POA: Diagnosis not present

## 2018-10-13 DIAGNOSIS — R092 Respiratory arrest: Secondary | ICD-10-CM | POA: Diagnosis not present

## 2018-10-13 DIAGNOSIS — J849 Interstitial pulmonary disease, unspecified: Secondary | ICD-10-CM | POA: Diagnosis not present

## 2018-10-16 ENCOUNTER — Ambulatory Visit: Payer: Medicare Other | Admitting: Internal Medicine

## 2018-10-27 ENCOUNTER — Other Ambulatory Visit: Payer: Self-pay | Admitting: Internal Medicine

## 2018-10-27 ENCOUNTER — Telehealth: Payer: Self-pay | Admitting: Internal Medicine

## 2018-10-27 NOTE — Telephone Encounter (Signed)
Pt has a f/u appt scheduled 11/04/2018 with PFT prior. Nothing further needed.

## 2018-10-27 NOTE — Telephone Encounter (Signed)
Last PFT apriol 2019 Last HRCT oct 2019  Plan - do spiro/dlco and return to ILD clinic in feb/march 2020

## 2018-11-04 ENCOUNTER — Encounter: Payer: Self-pay | Admitting: Internal Medicine

## 2018-11-04 ENCOUNTER — Ambulatory Visit (INDEPENDENT_AMBULATORY_CARE_PROVIDER_SITE_OTHER): Payer: Medicare Other | Admitting: Internal Medicine

## 2018-11-04 VITALS — BP 140/82 | HR 102 | Ht 67.0 in | Wt 180.0 lb

## 2018-11-04 DIAGNOSIS — J849 Interstitial pulmonary disease, unspecified: Secondary | ICD-10-CM | POA: Diagnosis not present

## 2018-11-04 DIAGNOSIS — J439 Emphysema, unspecified: Secondary | ICD-10-CM | POA: Diagnosis not present

## 2018-11-04 DIAGNOSIS — Z23 Encounter for immunization: Secondary | ICD-10-CM

## 2018-11-04 LAB — PULMONARY FUNCTION TEST
DL/VA % pred: 60 %
DL/VA: 2.66 ml/min/mmHg/L
DLCO UNC: 12.09 ml/min/mmHg
DLCO unc % pred: 42 %
FEF 25-75 PRE: 4.17 L/s
FEF2575-%Pred-Pre: 198 %
FEV1-%Pred-Pre: 108 %
FEV1-Pre: 3.03 L
FEV1FVC-%Pred-Pre: 117 %
FEV6-%Pred-Pre: 97 %
FEV6-Pre: 3.52 L
FEV6FVC-%Pred-Pre: 106 %
FVC-%Pred-Pre: 91 %
FVC-Pre: 3.53 L
Pre FEV1/FVC ratio: 86 %
Pre FEV6/FVC Ratio: 100 %

## 2018-11-04 NOTE — Progress Notes (Signed)
Patient ID: [name], male   DOB: 1945/11/10, 73 y.o.   MRN: [id number]   PCP Lorella Nimrod, MD   HPI  OV 02/04/2018  Chief Complaint  Patient presents with  . Consult    Referred by Dr. Reesa Chew due to possible ILD.  Pt has had a cough since he became sick in January 2019. Also has c/o SOB, pain in his left side. DME: [facility], 2L O2.  hx from patient, chart review and ILD questionnaire   73 year old male known at baseline to have COPD NOS (even as of 2012 but never on o2 till Jan 2019) . Referred to the interstitial lung disease clinic.  It is a very complex history since January 2019 when his problem started.  I had a very difficult time piecing together all parts of his history because of multiple admissions in January 2019 and again April 2019 and a transfer to [facility] in January 2019.    But as best as I can get the patient tells me that all his problems started only in January 2019.  He was admitted Oct 14 2017 with abd pain due to cholangitis . 3 days later Oct 17, 2017 when he was consulted for acute hypoxemic respiratory failure with pulmonary infiltrates that was initially considered aspiration/ARDS v cholangitis sepsis ALI. Followup Pulm consult considered Chronic  HP based on CT chest findings.  He did have liver injury and jaundice and high fevers. However, patient tells me that he never aspirated (though at [facility] later in Jan 2019 he had failed swallow).  Discharged Oct 22, 2017 to [facility].   It seems at that point he had significant jaundice and the [facility] notes indicate he had sepsis secondary to ascending cholangitis and hyperbilirubinemia.  When he was transferred to [facility] in January 2019 he was on 14 L nasal cannula.  He was intubated for ease ERCP.  At some point it is been determined that he needs a cholecystectomy which he is yet to have.  Also  Jan 2019 sputum culture was positive for AFB which is negative for MTB and and negative for MAI at time  of discharge from [facility] and as of [place] note 11/11/17. Currently shows he  Grew Mycobacterium abseccuss - rapid grower . Key evaluation at [facility] in Jan 2019  is indicated below.  In particular with terms of autoimmune profile he did have a positive P-ANCA level at [facility] in January 2019.  However this might have been negative at [facility].  His sedimentation rate was extremely high at 94, IgE 678 and  never been on prednisone to treat this according to his history.    At the time of discharge from [facility] in January 2019/February 2019 [apparently spent 1-1/2 months there] he was discharged on 3-1/2 L at rest with 7 L exertion.  Of note he is also been reported to have emphysema on the CT chest  At this point in time he continues to do better.  He is between 2 and 3 L of oxygen with rest and exertion.  Early April 2019 he had another admission at [facility] for atypical chest pain.  CT angiogram ruled out PE.  But I personally visualized the scan it shows bilateral upper lobe diffuse groundglass opacities.  The official report is that it is somewhat better compared to January 2019.  There is upper lobe predominance to this.   Review of [facility] ILD  questionnaire  Symptoms: He has had cough since January 2019 associated with sputum production.  It is roughly the same since onset.Marland Kitchen  He also has dyspnea since the last 5 months.  He believes it slowly getting worse but at the same time he is on less oxygen and he also says he is getting better.  In terms of severity he is a level 5 out of 5 while doing laundry and a level 4 out of 5 while shopping on making the bed with oxygen in a level 3 work for showering or shaving.  On a level 3 walking on a level at his own pace.  His shortness of breath limits him significantly  Review of systems: He reports fatigue arthralgia, dysphagia, weight loss for the last few to several months, some  snoring but this is been going on for decades  Past medical history: For COPD positive he says that he has had rheumatoid arthritis since 2001 but denies other autoimmune diseases.  He also has sleep apnea for the last few to several decades  Family history of lung disease: Essentially negative for pulmonary fibrosis COPD asthma sarcoid cystic fibrosis hypersensitivity pneumonitis or autoimmune disease  Exposure history: He smoked from 1959 till 2019.  1 pack a day.  He quit in January 2019 after he got ill.  He is lived in the same house.  The house is 73 years old.  He is lived there for 5 months.  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  He was in the greenhouse all the time.  There was significant amount of soil and mold and damp woodchips and tobacco leaves and mulch which was moldy.  Therefore has been exposed to mold he plans and also the damp water system in the greenhouse is been exposed to sawdust and multigrain.  He is also mixed herbicides  Pulmonary toxicity history: He denies   January 2019 labs Hepatitis viral panel: Negative HIV: Negative CMV: Negative EBV: Indicative of past infection Legionella: Negative Blood and sputum cultures: No growth to date Sputum AFB: mycobacterim abscessus Sputum cytology: Negative Respiratory viral panel: Negative GI pathogen panel: Positive only for EPEC Aspergillus and fungal markers:negativ x 3 ANA: Negative ANCA: p-anca positve 1:160 Anti-mitochondrial antibody: Positive at 29.1 Anti-smooth muscle antibody: Pending IgE: Elevated at 678 Porphobilinogen: Pending ACE level: Normal Inflammatory markers: Ferritin 1100, CRP 26, pro calcitonin 0.84 Results for [name], [name] (MRN [id number]) as of 02/04/2018 15:08  CT chest January 07, 2018 that I personally visualized and agree with the findings  IMPRESSION: 1. No  evidence of pulmonary embolism. 2. Patchy consolidation throughout both lower lobes, largely new since most recent chest CT of 10/17/2017. Mild patchy consolidation in the dependent upper lobes is mildly increased. Findings are most worrisome for multilobar pneumonia/aspiration. 3. Underlying patchy ground-glass opacities in both lungs, decreased from prior chest CT. Patchy reticulation in both lungs, nonspecific, difficult to exclude underlying interstitial lung disease. Follow-up high-resolution chest CT may be obtained in 3-6 months as clinically warranted. 4. Mild right paratracheal lymphadenopathy is new and nonspecific, more likely reactive. 5. Moderate emphysema with mild diffuse bronchial wall thickening, suggesting COPD.  Emphysema (ICD10-J43.9).   Electronically Signed   By: Ilona Sorrel M.D.   On: 01/07/2018 18:49   OV 04/01/2018  Chief Complaint  Patient presents with  . Follow-up  HRCT performed 5/10.  Pt has ID appt scheduled 6/3. Pt states things have been up and down since last visit. Pt has had some worsening with his breathing that began 2 days ago to where sats would not get out of the 80s. Pt also has a cough with clear mucus. Denies any CP.  follow-up chronic hypoxemic respiratory failure complicated situation. After last visit we deem him to have low stage II COPD with emphysema associated with interstitial lung disease with a background history of rheumatoid arthritis. He had pulmonary infiltrates and gender 2019 that seemed to be improving by spring 2019. He had grown atypical mycobacterium and I referred him to infectious diseases.  At this point in time he presents for follow-up he says compared to January 2019 overall his health status is much better. Compared to 2 months ago he somewhat better. His physical conditioning is improved. He continues to be stable on 3 L nasal oxygen. His hypoxemia has not improved any further. He did see infectious diseases  Dr. Graylon Good 03/10/2018 was admitted that his atypical mycobacterium warrants treatment but will be difficult to treat. She is retesting his sputum for mycobacteria and wondered pulmonary input. He has had another CT scan radiologist thinks he is associated UIP. His autoimmune profile shows several trace antibodies thus raising the specter of pulmonary fibrosis with autoimmune features but he also has history of previous rheumatoid arthritis. To me the images look a little bit better than January 2019 on my personal visualization and looks similar to April 2019.  New issues that a few days ago he started having unexplained worsening shortness of breath and hypoxemia transient. There is no associated increase in cough or sputum production although he does have some amount of white-yellow sputum. This no associated increase in wheezing. It has been heart in the environment recently with high humidity.there is no chest pain or orthopnea paroxysmal nocturnal dyspnea or hemoptysis.    Results for [name], [name] (MRN [id number]) as of 04/01/2018 12:21  Ref. Range 10/21/2017 14:11 12/30/2017 15:00 02/04/2018 16:24  Anti Nuclear Antibody(ANA) Latest Ref Range: NEGATIVE    POSITIVE (A)  ANA Pattern 1 Unknown   NUCLEAR ENVELOPE (A)  ANA Titer 1 Latest Units: titer   1:640 (H)  Angiotensin-Converting Enzyme Latest Ref Range: 9 - 67 U/L   55  Cyclic Citrullin Peptide Ab Latest Units: UNITS   <16  ds DNA Ab Latest Units: IU/mL   1  Mitochondrial Ab Latest Ref Range: 0.0 - 20.0 Units  21.3 (H)   Myeloperoxidase Abs Latest Units: AI   <1.0  Serine Protease 3 Latest Units: AI   <1.0  RA Latex Turbid. Latest Ref Range: <14 IU/mL   27 (H)  Cytoplasmic (C-ANCA) Latest Ref Range: Neg:<1:20 titer <1:20    P-ANCA Latest Ref Range: Neg:<1:20 titer 1:160 (H)    Atypical P-ANCA titer Latest Ref Range: Neg:<1:20 titer <1:20     IMPRESSION: 1. Today's study demonstrates progressive interstitial lung disease with a typical  usual interstitial pneumonia (UIP) CT pattern. 2. Aortic atherosclerosis, in addition to two vessel coronary artery disease. Assessment for potential risk factor modification, dietary therapy or pharmacologic therapy may be warranted, if clinically indicated. 3. Mild diffuse bronchial wall thickening with mild centrilobular and paraseptal emphysema. 4. 3 mm nonobstructive calculus in the upper pole collecting system of left kidney.  Aortic Atherosclerosis (ICD10-I70.0) and Emphysema (ICD10-J43.9).   Electronically Signed   By: Vinnie Langton M.D.   On: 02/14/2018 12:59  OV 07/02/2018  Subjective:  Patient ID: [name], male , DOB: Mar 29, 1946 , age 51 y.o. , MRN: [id number] , ADDRESS: [place] [address]   07/02/2018 -   Chief Complaint  Patient presents with  . Follow-up    Pt has been having more problems with his breathing and has also been coughing.  States he also has had some CP.     HPI [name] 73 y.o. -has emphysema mixed with ILD.  The basis of ILD is not fully clear because of a previous history of rheumatoid arthritis but 2019 autoimmune panel being positive for several different other antibodies and also a history of strong mold exposure and admission for acute on chronic hypoxemic respiratory failure with worsening infiltrates in /2019.  Grew Mycobacterium for which she is on observation therapy   I have not seen him in several months.  In the interim he has visited ID clinic where they placed him on observation therapy.  He has seen Dr. Graylon Good.  I personally reviewed that note.  He tells me that his oxygen requirements have improved based on subjective assessment.  He only uses oxygen at night and rarely in the daytime.  He is feeling better but he still has significant amount of cough and shortness of breath.  COPD CAT score is 20.  He has gained weight and energy.  The only new she is having is that the last few weeks he has had  swelling of the left lower calf.  There is no redness.  There is no injury.  It has not affected his breathing.  OV 11/04/2018  Subjective:  Patient ID: [name], male , DOB: Oct 25, 1945 , age 86 y.o. , MRN: [id number] , ADDRESS: [place] [address]   11/04/2018 -   Chief Complaint  Patient presents with  . Follow-up    HRCT performed 07/26/18 and PFT performed today.  Pt states his breathing has been worse than before and states he is having to use his rescue inhaler about 3 times a day. Pt also has complaints of occ coughing and also states he has had chest tightness.   [name] 73 y.o. -has emphysema mixed with ILD.  The basis of ILD is not fully clear because of a previous history of rheumatoid arthritis but 2019 autoimmune panel being positive for several different other antibodies and also a history of strong mold exposure and admission for acute on chronic hypoxemic respiratory failure with worsening infiltrates in /2019.  Grew Mycobacterium for which she is on observation therapy  HPI [name] 73 y.o. -presents for follow-up November 04, 2018.  Since his last visit in September 2019 he reports continued stability and shortness of breath.  He is walking around his house without the need of oxygen.  His lowest pulse ox was 92% in the house.  He is pleased with his improvement.  He had pulmonary function test today that compared to spring 2019 shows significant improvement.  He had high-resolution CT chest in October 2019 but this was after his visit with me in September 2019.  The high-resolution CT chest compared to earlier in the year shows continued stability.  I personally visualized the CT chest she has mixture of emphysema and also bilateral lower lobe honeycombing.  The radiologist is calling it classic UIP pattern although the history is 1 of postinflammatory fibrosis.  He has seen infectious diseases for the last visit was in [place]  aug 2019  before my previous visit.  He continues to be on observation therapy with them.  His last autoimmune profile in April 2019 reveals positive ANA 1: 640 and a trace positive rheumatoid factor raising the possibility he might also have IPAF     CAT COPD Symptom & Quality of Life Score ([place]) 0 is no burden. 5 is highest burden 07/02/2018   Never Cough -> Cough all the time 3  No phlegm in chest -> Chest is full of phlegm 3  No chest tightness -> Chest feels very tight 0  No dyspnea for 1 flight stairs/hill -> Very dyspneic for 1 flight of stairs 4  No limitations for ADL at home -> Very limited with ADL at home 3  Confident leaving home -> Not at all confident leaving home 0  Sleep soundly -> Do not sleep soundly because of lung condition 3  Lots of Energy -> No energy at all 4  TOTAL Score (max 40)  20     Results for VIOLET, CART (MRN [id number]) as of 11/04/2018 15:55  Ref. Range 01/07/2018 10:46 11/04/2018 14:29  FVC-Pre Latest Units: L 2.39 3.53  FVC-%Pred-Pre Latest Units: % 61 91   Results for JERMALL, ISAACSON (MRN [id number]) as of 11/04/2018 15:55  Ref. Range 01/07/2018 10:46 11/04/2018 14:29  DLCO unc Latest Units: ml/min/mmHg 5.98 12.09  DLCO unc % pred Latest Units: % 21 42     Simple office walk 185 feet x  3 laps goal with forehead probe 07/02/2018  11/04/2018   O2 used Room air Room air  Number laps completed 3 3 laps  Comments about pace Normal/fst Mod pace  Resting Pulse Ox/HR 99% and 75/min 98% and 102/min  Final Pulse Ox/HR 94% and 113/min 95% and 126/min  Desaturated </= 88% no no  Desaturated <= 3% points yes Yes, 3 points  Got Tachycardic >/= 90/min yes yes  Symptoms at end of test Mild dyspnea No complaints  Miscellaneous comments beter     IMPRESSION HRCT - Oct 2019 comapred to may 2019 1. No interval progression of fibrotic interstitial lung disease with mild-to-moderate honeycombing. Findings are consistent with UIP per consensus  guidelines: Diagnosis of Idiopathic Pulmonary Fibrosis: An Official ATS/ERS/JRS/ALAT Clinical Practice Guideline. [place], Iss 5, [id number], Jun 08 2017. 2. Two-vessel coronary atherosclerosis.  Aortic Atherosclerosis (ICD10-I70.0) and Emphysema (ICD10-J43.9).   Electronically Signed   By: Ilona Sorrel M.D.   On: 07/16/2018 14:37    has a past medical history of Anxiety and depression, Arthritis, Asthma, Cholangitis due to bile duct calculus with obstruction (10/2017), COPD (chronic obstructive pulmonary disease) ([place]), GERD (gastroesophageal reflux disease), HTN (hypertension), Hyperlipemia, Migraine, Personal history of alcoholism ([place]), Pneumonia, Sleep apnea, Tobacco use, and Tubular adenoma (01/29/2008).   reports that he quit smoking about 12 months ago. His smoking use included cigarettes. He has a 27.50 pack-year smoking history. He has never used smokeless tobacco.  Past Surgical History:  Procedure Laterality Date  . ANTERIOR FUSION CERVICAL SPINE  2005   two surgeries after two mechanical falls, per Dr. Donald Pore  . COLONOSCOPY W/ BIOPSIES  01/29/2008   Dr. Silvano Rusk  . ESOPHAGOGASTRODUODENOSCOPY N/A 10/15/2017   Procedure: ESOPHAGOGASTRODUODENOSCOPY (EGD);  Surgeon: Irene Shipper, MD;  Location: [facility];  Service: Endoscopy;  Laterality: N/A;  . [place]   correction for crossed-eyes  . HEMORRHOID SURGERY  1971    No Known Allergies  Immunization  History  Administered Date(s) Administered  . Influenza, High Dose Seasonal PF 11/04/2018  . Influenza, Seasonal, Injecte, Preservative Fre 07/22/2012  . Influenza,inj,Quad PF,6+ Mos 08/23/2014, 07/20/2016, 06/27/2017  . Influenza-Unspecified 08/06/2017  . Pneumococcal Conjugate-13 07/12/2017  . Pneumococcal Polysaccharide-23 12/02/2012  . Tdap 06/23/2011, 04/20/2016    Family History  Problem Relation Age of Onset  . Heart attack Mother   . Heart attack Father   . Stroke  Sister   . Heart attack Brother   . Pancreatic cancer Brother   . Heart attack Sister   . Breast cancer Other        niece     Current Outpatient Medications:  .  acetaminophen (TYLENOL) 325 MG tablet, Take 2 tablets (650 mg total) by mouth every 6 (six) hours as needed for mild pain or moderate pain., Disp: 60 tablet, Rfl: 0 .  albuterol (PROAIR HFA) 108 (90 Base) MCG/ACT inhaler, INHALE 2 PUFFS INTO THE LUNGS FOUR TIMES DAILY, Disp: 8.5 g, Rfl: 6 .  aspirin EC 81 MG tablet, Take 1 tablet (81 mg total) by mouth daily., Disp: 150 tablet, Rfl: 2 .  atorvastatin (LIPITOR) 20 MG tablet, TAKE 1 TABLET(20 MG) BY MOUTH DAILY, Disp: 90 tablet, Rfl: 2 .  budesonide-formoterol (SYMBICORT) 80-4.5 MCG/ACT inhaler, Inhale 2 puffs into the lungs 2 (two) times daily., Disp: 1 Inhaler, Rfl: 12 .  doxazosin (CARDURA) 1 MG tablet, TAKE 1 TABLET(1 MG) BY MOUTH AT BEDTIME, Disp: 90 tablet, Rfl: 2 .  furosemide (LASIX) 20 MG tablet, TAKE 1 TABLET(20 MG) BY MOUTH DAILY FOR LEG SWELLING, Disp: 30 tablet, Rfl: 2 .  gabapentin (NEURONTIN) 300 MG capsule, TAKE 1 CAPSULE(300 MG) BY MOUTH TWICE DAILY, Disp: 60 capsule, Rfl: 2 .  mirtazapine (REMERON) 15 MG tablet, TAKE 1 TABLET(15 MG) BY MOUTH AT BEDTIME, Disp: 30 tablet, Rfl: 0 .  Multiple Vitamins-Minerals (CENTRUM SILVER ADULT 50+) TABS, Take 1 tablet by mouth daily., Disp: , Rfl:  .  OXYGEN, Inhale 3-7 L/min into the lungs continuous. , Disp: , Rfl:  .  polyethylene glycol (MIRALAX / GLYCOLAX) packet, Take 17 g by mouth daily as needed for mild constipation. , Disp: , Rfl:  .  SPIRIVA HANDIHALER 18 MCG inhalation capsule, INHALE CONTENTS OF 1 CAPSULE ONCE DAILY USING HANDIHALER, Disp: 90 capsule, Rfl: 0 .  nitroGLYCERIN (NITROSTAT) 0.4 MG SL tablet, Place 1 tablet (0.4 mg total) under the tongue every 5 (five) minutes as needed for chest pain. x3 doses (Patient not taking: Reported on 07/02/2018), Disp: 10 tablet, Rfl: 2    OBJECTIVVE  Vitals:   11/04/18 1537   BP: 140/82  Pulse: (!) 102  SpO2: 98%  Weight: 180 lb (81.6 kg)  Height: 5\' 7"  (1.702 m)    Estimated body mass index is 28.19 kg/m as calculated from the following:   Height as of this encounter: 5\' 7"  (1.702 m).   Weight as of this encounter: 180 lb (81.6 kg).  General Appearance:    Alert, cooperative, no distress, appears stated age - older , sitting on - chair  Head:    Normocephalic, without obvious abnormality, atraumatic  Eyes:    PERRL, conjunctiva/corneas clear,  Ears:    Normal TM's and external ear canals, both ears  Nose:   Nares normal, septum midline, mucosa normal, no drainage    or sinus tenderness. OXYGEN ON no @ ra  Throat:   Lips, mucosa, and tongue normal; teeth and gums normal. Cyanosis on lips - no  Neck:  Supple, symmetrical, trachea midline, no adenopathy;    thyroid:  no enlargement/tenderness/nodules; no carotid   bruit or JVD  Back:     Symmetric, no curvature, ROM normal, no CVA tenderness  Lungs:     Distress - no , Wheeze no, Barrell Chest - no, Purse lip breathing - no, Crackles - yes at bas   Chest Wall:    No tenderness or deformity. Scars in chest no   Heart:    Regular rate and rhythm, S1 and S2 normal, no murmur, rub   or gallop  Breast Exam:    NOT DONE  Abdomen:     Soft, non-tender, bowel sounds active all four quadrants,    no masses, no organomegaly  Genitalia:   NOT DONE  Rectal:   NOT DONE  Extremities:   Extremities normal, atraumatic, Clubbing - YES, Edema - no  Pulses:   2+ and symmetric all extremities  Skin:   Stigmata of Connective Tissue Disease - no  Lymph nodes:   Cervical, supraclavicular, and axillary nodes normal  Psychiatric:  Neurologic:   pleasant CNII-XII intact, normal strength, sensation  throughout           ICD-10-CM   1. ILD (interstitial lung disease) (HCC) J84.9 Antinuclear Antib (ANA)    Rheumatoid Factor    Cyclic citrul peptide antibody, IgG    Pulmonary function test    Rheumatoid Factor     Cyclic citrul peptide antibody, IgG    Antinuclear Antib (ANA)  2. Pulmonary emphysema, unspecified emphysema type ([place]) J43.9   3. Encounter for immunization Z23 Flu vaccine HIGH DOSE PF    Patient Instructions     ICD-10-CM   1. ILD (interstitial lung disease) ([place]) J84.9   2. Pulmonary emphysema, unspecified emphysema type ([place]) J43.9    Your CT scan in October 2019  is stable compared to May 2019 while your breathing test in January 2020 is improved compared to April 2019  However your CT scan has a pattern that suggest that the disease could potentially unpredictably get worse in the future  Plan -Given improvement in breathing test and stability with improvement and CT scan of the chest I think we can electiv to  watch  pulmonary fibrosis with continued supportive care which means using oxygen at night and continue emphysema inhalers  -In order to understand risk of future progression: Check ANA, rheumatoid factor and CCP antibodies 11/04/2018  - get flu shot 11/04/2018   - Please talk to PCP Lorella Nimrod, MD -  and ensure you get  shingrix ([place]) inactivated vaccine against shingles  Followup  6 months do spirometry/dlco Return to regular clinic in 6 months or sooner if needed     > 50% of this > 25 min visit spent in face to face counseling or coordination of care - by this undersigned MD - Dr Brand Males. This includes one or more of the following documented above: discussion of test results, diagnostic or treatment recommendations, prognosis, risks and benefits of management options, instructions, education, compliance or risk-factor reduction     SIGNATURE    Dr. Brand Males, M.D., F.C.C.P,  Pulmonary and Critical Care Medicine Staff Physician, [place] Director - Interstitial Lung Disease  Program  [facility] [place] [facility] [place], [place], [address]  Pager: [phone], If no answer or  between  15:00h - 7:00h: call [phone] Telephone: [phone]  5:33 PM 11/04/2018

## 2018-11-04 NOTE — Patient Instructions (Addendum)
ICD-10-CM   1. ILD (interstitial lung disease) ([place]) J84.9   2. Pulmonary emphysema, unspecified emphysema type ([place]) J43.9    Your CT scan in October 2019  is stable compared to May 2019 while your breathing test in January 2020 is improved compared to April 2019  However your CT scan has a pattern that suggest that the disease could potentially unpredictably get worse in the future  Plan -Given improvement in breathing test and stability with improvement and CT scan of the chest I think we can electiv to  watch  pulmonary fibrosis with continued supportive care which means using oxygen at night and continue emphysema inhalers  -In order to understand risk of future progression: Check ANA, rheumatoid factor and CCP antibodies 11/04/2018  - get flu shot 11/04/2018   - Please talk to PCP Lorella Nimrod, MD -  and ensure you get  shingrix ([place]) inactivated vaccine against shingles  Followup  6 months do spirometry/dlco Return to regular clinic in 6 months or sooner if needed

## 2018-11-04 NOTE — Progress Notes (Signed)
PFT done today. 

## 2018-11-11 LAB — CYCLIC CITRUL PEPTIDE ANTIBODY, IGG: Cyclic Citrullin Peptide Ab: <16 UNITS

## 2018-11-11 LAB — ANTI-NUCLEAR AB-TITER (ANA TITER)
ANA TITER: 1:320 {titer} — ABNORMAL HIGH
ANA Titer 1: 1:80 {titer} — ABNORMAL HIGH

## 2018-11-11 LAB — ANA: Anti Nuclear Antibody(ANA): POSITIVE — AB

## 2018-11-11 LAB — RHEUMATOID FACTOR: Rheumatoid fact SerPl-aCnc: 28 IU/mL — ABNORMAL HIGH (ref <14)

## 2018-11-13 DIAGNOSIS — J849 Interstitial pulmonary disease, unspecified: Secondary | ICD-10-CM | POA: Diagnosis not present

## 2018-11-13 DIAGNOSIS — J9611 Chronic respiratory failure with hypoxia: Secondary | ICD-10-CM | POA: Diagnosis not present

## 2018-11-13 DIAGNOSIS — R092 Respiratory arrest: Secondary | ICD-10-CM | POA: Diagnosis not present

## 2018-11-13 DIAGNOSIS — J449 Chronic obstructive pulmonary disease, unspecified: Secondary | ICD-10-CM | POA: Diagnosis not present

## 2018-11-15 ENCOUNTER — Other Ambulatory Visit: Payer: Self-pay | Admitting: Internal Medicine

## 2018-12-02 ENCOUNTER — Encounter: Payer: Medicare Other | Admitting: Internal Medicine

## 2018-12-02 ENCOUNTER — Encounter: Payer: Self-pay | Admitting: Internal Medicine

## 2018-12-12 DIAGNOSIS — J849 Interstitial pulmonary disease, unspecified: Secondary | ICD-10-CM | POA: Diagnosis not present

## 2018-12-12 DIAGNOSIS — R092 Respiratory arrest: Secondary | ICD-10-CM | POA: Diagnosis not present

## 2018-12-12 DIAGNOSIS — J9611 Chronic respiratory failure with hypoxia: Secondary | ICD-10-CM | POA: Diagnosis not present

## 2018-12-12 DIAGNOSIS — J449 Chronic obstructive pulmonary disease, unspecified: Secondary | ICD-10-CM | POA: Diagnosis not present

## 2018-12-25 ENCOUNTER — Other Ambulatory Visit: Payer: Self-pay

## 2018-12-25 ENCOUNTER — Encounter: Payer: Self-pay | Admitting: Internal Medicine

## 2018-12-25 ENCOUNTER — Ambulatory Visit (INDEPENDENT_AMBULATORY_CARE_PROVIDER_SITE_OTHER): Payer: Medicare Other | Admitting: Internal Medicine

## 2018-12-25 ENCOUNTER — Ambulatory Visit (HOSPITAL_COMMUNITY)
Admission: RE | Admit: 2018-12-25 | Discharge: 2018-12-25 | Disposition: A | Discharge status: Home / Self Care | Payer: Medicare Other | Source: Ambulatory Visit | Attending: Internal Medicine | Admitting: Internal Medicine

## 2018-12-25 ENCOUNTER — Encounter (INDEPENDENT_AMBULATORY_CARE_PROVIDER_SITE_OTHER): Payer: Self-pay

## 2018-12-25 VITALS — BP 114/73 | HR 90 | Temp 98.1°F | Ht 67.0 in | Wt 182.9 lb

## 2018-12-25 DIAGNOSIS — J849 Interstitial pulmonary disease, unspecified: Secondary | ICD-10-CM

## 2018-12-25 DIAGNOSIS — R600 Localized edema: Secondary | ICD-10-CM | POA: Insufficient documentation

## 2018-12-25 DIAGNOSIS — M7989 Other specified soft tissue disorders: Secondary | ICD-10-CM

## 2018-12-25 DIAGNOSIS — J449 Chronic obstructive pulmonary disease, unspecified: Secondary | ICD-10-CM | POA: Diagnosis not present

## 2018-12-25 DIAGNOSIS — I878 Other specified disorders of veins: Secondary | ICD-10-CM

## 2018-12-25 NOTE — Progress Notes (Signed)
   CC: Left lower extremity edema  HPI:  [name] is a 73 y.o. with medical history listed below presenting for evaluation of left lower extremity edema.  Please see problem discharge for further details.  Past Medical History:  Diagnosis Date  . Anxiety and depression   . Arthritis   . Asthma   . Cholangitis due to bile duct calculus with obstruction 10/2017  . COPD (chronic obstructive pulmonary disease) ([place])   . GERD (gastroesophageal reflux disease)   . HTN (hypertension)   . Hyperlipemia   . Migraine   . Personal history of alcoholism ([name])   . Pneumonia   . Sleep apnea   . Tobacco use   . Tubular adenoma 01/29/2008   Review of Systems  Constitutional: Negative for chills and fever.  Respiratory: Negative for shortness of breath.   Cardiovascular: Positive for leg swelling (left lower extremity ). Negative for chest pain and orthopnea.  Gastrointestinal: Negative for abdominal pain.  Neurological: Negative for dizziness and weakness.    Physical Exam:  Vitals:   12/25/18 0858  BP: 114/73  Pulse: 90  Temp: 98.1 F (36.7 C)  TempSrc: Oral  SpO2: 94%  Weight: 182 lb 14.4 oz (83 kg)  Height: 5\' 7"  (1.702 m)   Physical Exam Vitals signs and nursing note reviewed.  Constitutional:      General: He is not in acute distress.    Appearance: He is not ill-appearing.  HENT:     Head: Normocephalic and atraumatic.  Cardiovascular:     Rate and Rhythm: Normal rate.     Heart sounds: Normal heart sounds.  Pulmonary:     Effort: Pulmonary effort is normal. No respiratory distress.     Breath sounds: No wheezing or rhonchi. Rales: Mild bilateral rales 2/2 COPD, ILD.  Abdominal:     Tenderness: There is no abdominal tenderness.  Musculoskeletal:        General: Swelling present. No tenderness, deformity or signs of injury.     Left lower leg: Edema present.     Comments: -Left lower extremity was measuring calf size measuring 40 cm.  Right lower  extremity calf size measuring 35 cm. - There is chronic venous changes at the lower extremity especially at the dorsal aspect of the foot  Skin:    Findings: No bruising or rash.  Neurological:     Mental Status: He is alert.  Psychiatric:        Mood and Affect: Mood normal.        Behavior: Behavior normal.       Assessment & Plan:   See Encounters Tab for problem based charting.  Patient discussed with Dr. Daryll Drown

## 2018-12-25 NOTE — Patient Instructions (Signed)
[name],  It was a pleasure taking care of you at the clinic today.  It seems like you had a left lower extremity swelling before and an ultrasound was done to make sure that you did not have a clot and this was normal.  Given that the swelling has reappeared, I am inclined to repeat an ultrasound of your left lower extremity to make sure there is no clot.  Here my recommendations after today's visit:  1.  I am ordering an ultrasound of your left lower extremity 2.  If this is normal, then the most likely driver of the swelling could be problems with the veins of your leg 3.  I will call you with the results.  Dr. Eileen Stanford  Please call the internal medicine center clinic if you have any questions or concerns, we may be able to help and keep you from a long and expensive emergency room wait. Our clinic and after hours phone number is [phone], the best time to call is Monday through Friday 9 am to 4 pm but there is always someone available 24/7 if you have an emergency. If you need medication refills please notify your pharmacy one week in advance and they will send [place] a request.

## 2018-12-25 NOTE — Addendum Note (Signed)
Addended by: Jean Rosenthal on: 12/25/2018 12:31 PM   Modules accepted: Orders

## 2018-12-25 NOTE — Assessment & Plan Note (Addendum)
Left lower extremity edema: [name] is a 73 year old gentleman with no pertinent medical history pertaining to his left lower extremity edema with exception of chronic venous stasis who presents for evaluation of left lower extremity swelling.  He reports this has been going on for 1 month and typically worsens at the end of the day and improves when he wakes up in the morning.  He denies erythema, pain, recent travel beyond 4 or 8 hours, immobility, surgery, fevers, chills.  Per chart review he was evaluated by pulmonology for similar complaints and underwent a duplex ultrasound of the left lower extremity in October 2019 when he reported of pain and left lower extremity swelling however duplex ultrasound was unremarkable.  He denies history of cirrhosis or heart failure.  On physical exam today, left lower extremity edema was nonpitting, measuring about 40 cm compared to the right lower extremity (which measured about 35 cm).  There were no noticeable erythema or tenderness.  He does have chronic superficial venous changes bilaterally at the lower extremities.  Differential diagnosis includes r/o DVT vs venous insufficiency vs lymphedema.  Cellulitis is less likely as the skin is not warm to touch and no systemic signs of infection.  ABI at clinic today -left ABI 1.37 -Right ABI 1.33  Plan: - Schedule left lower extremity duplex ultrasound up to groin. - If U/S is negative, can try compression stockings given the high probability of venous insufficiency.  ADDENDUM:  Right: No evidence of common femoral vein obstruction. Left: There is no evidence of deep vein thrombosis in the lower extremity. No cystic structure found in the popliteal fossa.  If edema persists, can consider obtaining Urine microalbumin/Cr. Ratio to evaluate for nephrotic syndrome

## 2018-12-29 NOTE — Progress Notes (Signed)
Internal Medicine Clinic Attending  Case discussed with Dr. Eileen Stanford at the time of the visit.  We reviewed the resident's history and exam and pertinent patient test results.  I agree with the assessment, diagnosis, and plan of care documented in the resident's note.     With these numbers on ABI, there is some concern for non-compressibility of the arteries, would consider full ABIs in the future.  Will forward recommendation to Dr. Eileen Stanford.

## 2019-01-10 ENCOUNTER — Other Ambulatory Visit: Payer: Self-pay | Admitting: Internal Medicine

## 2019-01-12 DIAGNOSIS — J449 Chronic obstructive pulmonary disease, unspecified: Secondary | ICD-10-CM | POA: Diagnosis not present

## 2019-01-12 DIAGNOSIS — R0902 Hypoxemia: Secondary | ICD-10-CM | POA: Diagnosis not present

## 2019-01-12 DIAGNOSIS — J9611 Chronic respiratory failure with hypoxia: Secondary | ICD-10-CM | POA: Diagnosis not present

## 2019-01-12 DIAGNOSIS — J849 Interstitial pulmonary disease, unspecified: Secondary | ICD-10-CM | POA: Diagnosis not present

## 2019-01-12 NOTE — Telephone Encounter (Signed)
Next appt scheduled 4/20 with PCP. 

## 2019-01-26 ENCOUNTER — Encounter: Payer: Medicare Other | Admitting: Internal Medicine

## 2019-02-02 ENCOUNTER — Other Ambulatory Visit: Payer: Self-pay

## 2019-02-02 ENCOUNTER — Ambulatory Visit (INDEPENDENT_AMBULATORY_CARE_PROVIDER_SITE_OTHER): Payer: Medicare Other | Admitting: Internal Medicine

## 2019-02-02 DIAGNOSIS — R600 Localized edema: Secondary | ICD-10-CM | POA: Diagnosis not present

## 2019-02-02 DIAGNOSIS — J849 Interstitial pulmonary disease, unspecified: Secondary | ICD-10-CM

## 2019-02-02 DIAGNOSIS — I10 Essential (primary) hypertension: Secondary | ICD-10-CM

## 2019-02-02 MED ORDER — BENZONATATE 100 MG PO CAPS: 200.0000 mg | ORAL_CAPSULE | Freq: Three times a day (TID) | ORAL | 1 refills | Status: AC | PRN

## 2019-02-02 NOTE — Assessment & Plan Note (Signed)
Patient was concerned about his left leg edema. Patient has a recent vascular ultrasound done which was negative for any DVT. Per chart review during previous exam he was having some nonpitting edema with venous congestion signs. Might be developing some lymphedema.  -Sent a prescription of compression stockings to his pharmacy and advised him to use them during the day and keep his legs elevated while resting. -We will reevaluate his swelling during next follow-up visit in clinic. -He will continue taking Lasix 20 mg daily.

## 2019-02-02 NOTE — Assessment & Plan Note (Signed)
Per patient he is checking his blood pressure 2-3 times per week and it remains mostly in 120s.  Denies any dizziness and is compliant with his medication.  Continue current management and reevaluate during next follow-up visit in clinic.

## 2019-02-02 NOTE — Assessment & Plan Note (Addendum)
Patient was complaining of dry coughing over the past 1 month, no other sign of infection.  Denies any sick contact or recent travel.  Continue to use 2 L of oxygen at night. Most likely due to his interstitial lung disease.  Advised patient to call his pulmonologist to get his advice. Gave him a prescription of Tessalon Perles.

## 2019-02-02 NOTE — Progress Notes (Signed)
   This is a telephone encounter between [name] and Lorella Nimrod on 02/02/2019 for CC. The visit was conducted with the patient located at home and Lorella Nimrod at [facility]. The patient's identity was confirmed using their DOB and current address. The patient has consented to being evaluated through a telephone encounter and understands the associated risks (an examination cannot be done and the patient may need to come in for an appointment) / benefits (allows the patient to remain at home, decreasing exposure to coronavirus). I personally spent 12 minutes on medical discussion.   CC: For follow-up of her hypertension and interstitial lung disease.  HPI:  [name] is a 73 y.o. with past medical history as listed below was given a call to follow-up of his chronic conditions.  Patient was complaining of dry cough for about a month which comes in boost of coughing lasting for 5 to 10 minutes.  He is currently experiencing multiple episodes in a day, try using cough drops and over-the-counter medications which does not help.  He denies any nasal congestion, postnasal drip, fever or chills.  He normally stays at home and denies any sick contacts.  He was also concerned about his lower extremity edema which is persistent despite using Lasix daily.  Per patient his right leg swelling improves after resting and in the morning but left leg remained swollen.  He has not tried any compression stockings yet.  He denies any worsening shortness of breath, orthopnea or PND.  Please see assessment and plan for his chronic conditions.  Past Medical History:  Diagnosis Date  . Anxiety and depression   . Arthritis   . Asthma   . Cholangitis due to bile duct calculus with obstruction 10/2017  . COPD (chronic obstructive pulmonary disease) ([place])   . GERD (gastroesophageal reflux disease)   . HTN (hypertension)   . Hyperlipemia   . Migraine   . Personal history of alcoholism ([place])   .  Pneumonia   . Sleep apnea   . Tobacco use   . Tubular adenoma 01/29/2008   Review of Systems: Negative except mentioned in HPI.  Physical Exam:  There were no vitals filed for this visit. Physical exam was not done as it was a tele-visit.  Assessment & Plan:   See Encounters Tab for problem based charting.  Patient discussed with Dr. Dareen Piano.

## 2019-02-03 NOTE — Progress Notes (Signed)
Internal Medicine Clinic Attending  Case discussed with Dr. Amin at the time of the visit.  We reviewed the resident's history and exam and pertinent patient test results.  I agree with the assessment, diagnosis, and plan of care documented in the resident's note.    

## 2019-02-11 DIAGNOSIS — R0902 Hypoxemia: Secondary | ICD-10-CM | POA: Diagnosis not present

## 2019-02-11 DIAGNOSIS — J9611 Chronic respiratory failure with hypoxia: Secondary | ICD-10-CM | POA: Diagnosis not present

## 2019-02-11 DIAGNOSIS — J449 Chronic obstructive pulmonary disease, unspecified: Secondary | ICD-10-CM | POA: Diagnosis not present

## 2019-02-11 DIAGNOSIS — J849 Interstitial pulmonary disease, unspecified: Secondary | ICD-10-CM | POA: Diagnosis not present

## 2019-02-24 ENCOUNTER — Other Ambulatory Visit: Payer: Self-pay | Admitting: Internal Medicine

## 2019-03-03 ENCOUNTER — Emergency Department (HOSPITAL_COMMUNITY)
Admission: EM | Admit: 2019-03-03 | Discharge: 2019-03-09 | Disposition: E | Payer: Medicare Other | Attending: Emergency Medicine | Admitting: Emergency Medicine

## 2019-03-03 DIAGNOSIS — Y9389 Activity, other specified: Secondary | ICD-10-CM | POA: Insufficient documentation

## 2019-03-03 DIAGNOSIS — Y999 Unspecified external cause status: Secondary | ICD-10-CM | POA: Insufficient documentation

## 2019-03-03 DIAGNOSIS — S0083XA Contusion of other part of head, initial encounter: Secondary | ICD-10-CM | POA: Insufficient documentation

## 2019-03-03 DIAGNOSIS — R0689 Other abnormalities of breathing: Secondary | ICD-10-CM | POA: Diagnosis not present

## 2019-03-03 DIAGNOSIS — R4020 Unspecified coma: Secondary | ICD-10-CM | POA: Diagnosis not present

## 2019-03-03 DIAGNOSIS — Y929 Unspecified place or not applicable: Secondary | ICD-10-CM | POA: Insufficient documentation

## 2019-03-03 DIAGNOSIS — S0990XA Unspecified injury of head, initial encounter: Secondary | ICD-10-CM | POA: Diagnosis present

## 2019-03-03 DIAGNOSIS — R404 Transient alteration of awareness: Secondary | ICD-10-CM | POA: Diagnosis not present

## 2019-03-03 DIAGNOSIS — I468 Cardiac arrest due to other underlying condition: Secondary | ICD-10-CM | POA: Insufficient documentation

## 2019-03-03 DIAGNOSIS — R001 Bradycardia, unspecified: Secondary | ICD-10-CM | POA: Diagnosis not present

## 2019-03-03 DIAGNOSIS — S3681XA Injury of peritoneum, initial encounter: Secondary | ICD-10-CM | POA: Diagnosis not present

## 2019-03-03 DIAGNOSIS — I469 Cardiac arrest, cause unspecified: Secondary | ICD-10-CM | POA: Diagnosis not present

## 2019-03-03 LAB — PREPARE FRESH FROZEN PLASMA
Unit division: 0
Unit division: 0

## 2019-03-03 LAB — BPAM FFP
Blood Product Expiration Date: 202006062359
Blood Product Expiration Date: 202006062359
ISSUE DATE / TIME: 202005261530
ISSUE DATE / TIME: 202005261530
Unit Type and Rh: 600
Unit Type and Rh: 6200

## 2019-03-03 LAB — BPAM RBC
Blood Product Expiration Date: 202006232359
Blood Product Expiration Date: 202006232359
ISSUE DATE / TIME: 202005261530
ISSUE DATE / TIME: 202005261530
Unit Type and Rh: 5100
Unit Type and Rh: 5100

## 2019-03-03 MED ORDER — SODIUM BICARBONATE 8.4 % IV SOLN
INTRAVENOUS | Status: AC | PRN
  Administered 2019-03-03: 50 meq via INTRAVENOUS

## 2019-03-03 MED ORDER — EPINEPHRINE 1 MG/10ML IJ SOSY
PREFILLED_SYRINGE | INTRAMUSCULAR | Status: AC | PRN
  Administered 2019-03-03: 1 mg via INTRAVENOUS

## 2019-03-03 MED ORDER — EPINEPHRINE 1 MG/10ML IJ SOSY
PREFILLED_SYRINGE | INTRAMUSCULAR | Status: AC | PRN
  Administered 2019-03-03 (×2): 1 mg via INTRAVENOUS

## 2019-03-04 MED FILL — Medication: Qty: 1 | Status: AC
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
## 2019-03-09 NOTE — ED Notes (Signed)
No pulses, no resp effort.  CPR d'cd per Dr. Zenia Resides
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
## 2019-03-09 NOTE — ED Notes (Signed)
I black helmet  1 pocket knife, 1 set keys, 1 pair blue jeans (cut off), 1 pair socks, 2 combs, 1 black belt.  $5.74 in change.  All belongings taken to morgue with pt.

## 2019-03-09 NOTE — ED Notes (Signed)
Pulse check, no pulses CPR started
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
## 2019-03-09 NOTE — ED Notes (Signed)
Fast exam by Dr. Grandville Silos was positive
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
## 2019-03-09 NOTE — ED Notes (Signed)
Monitor showing PEA no pulses present, CPR started.

## 2019-03-09 NOTE — ED Notes (Signed)
Bil 18 gauge cath. Placed by Dr. Grandville Silos to decompress chest.

## 2019-03-09 NOTE — ED Notes (Signed)
ME paged to Dr. Marijean Bravo @ [phone] -- [facility]

## 2019-03-09 NOTE — ED Notes (Signed)
Pulse check, on pulse, CPR resumed

## 2019-03-09 NOTE — Progress Notes (Signed)
Responded to ED CPR page to support patient and staff.  Patient Deceased. I called patient contact information number but got no answer went straigth to voice mail.  I made staff aware and nurse will follow up as needed.    Jaclynn Major, [place], [facility], Pager [phone]

## 2019-03-09 DEATH — deceased
